# Patient Record
Sex: Female | Born: 1990 | Race: White | Hispanic: No | Marital: Married | Smoking: Never smoker
Health system: Southern US, Community
[De-identification: ages and names within clinical notes are randomized; demographics above are authoritative.]

## PROBLEM LIST (undated history)

## (undated) ENCOUNTER — Inpatient Hospital Stay (HOSPITAL_COMMUNITY): Payer: Self-pay

## (undated) DIAGNOSIS — R112 Nausea with vomiting, unspecified: Secondary | ICD-10-CM

## (undated) DIAGNOSIS — O169 Unspecified maternal hypertension, unspecified trimester: Secondary | ICD-10-CM

## (undated) DIAGNOSIS — N12 Tubulo-interstitial nephritis, not specified as acute or chronic: Secondary | ICD-10-CM

## (undated) DIAGNOSIS — Z9889 Other specified postprocedural states: Secondary | ICD-10-CM

## (undated) DIAGNOSIS — Z789 Other specified health status: Secondary | ICD-10-CM

## (undated) DIAGNOSIS — N390 Urinary tract infection, site not specified: Secondary | ICD-10-CM

## (undated) HISTORY — PX: OTHER SURGICAL HISTORY: SHX169

## (undated) HISTORY — PX: TONSILLECTOMY: SUR1361

## (undated) HISTORY — PX: GALLBLADDER SURGERY: SHX652

---
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
## 2011-04-20 LAB — CBC
MCV: 91.1
Platelets: 242
RDW: 11.5
WBC: 7.6

## 2011-04-20 LAB — HCG, QUANTITATIVE, PREGNANCY: hCG, Beta Chain, Quant, S: <2

## 2011-04-20 LAB — BASIC METABOLIC PANEL
BUN: 7
Chloride: 106
Creatinine, Ser: 0.74
Glucose, Bld: 110 — ABNORMAL HIGH

## 2011-04-20 LAB — HEPATIC FUNCTION PANEL
AST: 27
Bilirubin, Direct: 0.1
Total Bilirubin: 0.5

## 2011-04-23 LAB — BASIC METABOLIC PANEL
Calcium: 9.5
Glucose, Bld: 98
Potassium: 3.7
Sodium: 138

## 2011-04-23 LAB — URINALYSIS, ROUTINE W REFLEX MICROSCOPIC
Glucose, UA: NEGATIVE
Hgb urine dipstick: NEGATIVE
Protein, ur: NEGATIVE
Specific Gravity, Urine: 1.025
pH: 5.5

## 2011-04-23 LAB — CBC
HCT: 36.7
Hemoglobin: 12.9
RBC: 3.96
RDW: 11.7
WBC: 8.1

## 2011-07-13 NOTE — L&D Delivery Note (Signed)
Delivery Note At 4:46 PM a viable female was delivered via Vaginal, Spontaneous Delivery (Presentation: Left Occiput Anterior).  Loose nuchal cord x 2 reduced prior to delivery of body.  APGAR: 6, 9; weight 7 lb 12.7 oz (3535 g).   Placenta status: Intact, Spontaneous via Tomasa Blase.  Cord: 3 vessels with the following complications: None.    Anesthesia: Epidural  Episiotomy: None Lacerations: None Suture Repair: none Est. Blood Loss (mL): 250  Mom to postpartum.  Baby stabilized in warmer then skin-to skin with mom.  Raelyn Mora, SNM 06/06/2012, 5:13 PM Supervised by: Caren Griffins, CNM  Evaluation and management procedures were performed by SNM under my supervision/collaboration. Chart reviewed, patient examined by me and I agree with management and plan. Present for most of active 2nd stage and 3rd stage. Danae Orleans, CNM 06/06/2012 6:23 PM

## 2011-10-12 LAB — OB RESULTS CONSOLE RUBELLA ANTIBODY, IGM: Rubella: IMMUNE

## 2011-10-12 LAB — OB RESULTS CONSOLE ANTIBODY SCREEN: Antibody Screen: NEGATIVE

## 2011-10-14 ENCOUNTER — Inpatient Hospital Stay (HOSPITAL_COMMUNITY)
Admission: AD | Admit: 2011-10-14 | Discharge: 2011-10-14 | Disposition: A | Discharge status: Home / Self Care | Payer: PRIVATE HEALTH INSURANCE | Attending: Obstetrics & Gynecology | Admitting: Obstetrics & Gynecology

## 2011-10-14 ENCOUNTER — Encounter (HOSPITAL_COMMUNITY): Payer: Self-pay | Admitting: *Deleted

## 2011-10-14 ENCOUNTER — Inpatient Hospital Stay (HOSPITAL_COMMUNITY): Payer: PRIVATE HEALTH INSURANCE

## 2011-10-14 DIAGNOSIS — N760 Acute vaginitis: Secondary | ICD-10-CM | POA: Insufficient documentation

## 2011-10-14 DIAGNOSIS — B9689 Other specified bacterial agents as the cause of diseases classified elsewhere: Secondary | ICD-10-CM | POA: Insufficient documentation

## 2011-10-14 DIAGNOSIS — O26859 Spotting complicating pregnancy, unspecified trimester: Secondary | ICD-10-CM

## 2011-10-14 DIAGNOSIS — A499 Bacterial infection, unspecified: Secondary | ICD-10-CM | POA: Insufficient documentation

## 2011-10-14 DIAGNOSIS — O2390 Unspecified genitourinary tract infection in pregnancy, unspecified trimester: Secondary | ICD-10-CM | POA: Insufficient documentation

## 2011-10-14 HISTORY — DX: Other specified health status: Z78.9

## 2011-10-14 LAB — URINALYSIS, ROUTINE W REFLEX MICROSCOPIC
Glucose, UA: NEGATIVE mg/dL
Ketones, ur: NEGATIVE mg/dL
Leukocytes, UA: NEGATIVE
Nitrite: NEGATIVE
Specific Gravity, Urine: 1.025 (ref 1.005–1.030)
pH: 5.5 (ref 5.0–8.0)

## 2011-10-14 LAB — WET PREP, GENITAL: Trich, Wet Prep: NONE SEEN

## 2011-10-14 LAB — URINE MICROSCOPIC-ADD ON

## 2011-10-14 LAB — POCT PREGNANCY, URINE: Preg Test, Ur: POSITIVE — AB

## 2011-10-14 MED ORDER — METRONIDAZOLE 500 MG PO TABS: 500.0000 mg | ORAL_TABLET | Freq: Two times a day (BID) | ORAL | Status: AC

## 2011-10-14 NOTE — MAU Provider Note (Signed)
History     CSN: [id number]  Arrival date and time: 10/14/11 5366   First Provider Initiated Contact with Patient 10/14/11 0501      Chief Complaint  Patient presents with  . Vaginal Bleeding   HPI 21 y.o. G1P0 at [redacted]w[redacted]d with spotting since 11 PM tonight, mild low abd for about 2 hours. U/S at [facility] on Monday showed IUP with + FHR per patient.    Past Medical History  Diagnosis Date  . No pertinent past medical history     Past Surgical History  Procedure Date  . Tonsillectomy     History reviewed. No pertinent family history.  History  Substance Use Topics  . Smoking status: Never Smoker   . Smokeless tobacco: Not on file  . Alcohol Use: No    Allergies: No Known Allergies  Prescriptions prior to admission  Medication Sig Dispense Refill  . flintstones complete (FLINTSTONES) 60 MG chewable tablet Chew 2 tablets by mouth every morning.      . ondansetron (ZOFRAN) 8 MG tablet Take 8 mg by mouth every 8 (eight) hours as needed. For nausea or vomiting        Review of Systems  Constitutional: Negative.   Respiratory: Negative.   Cardiovascular: Negative.   Gastrointestinal: Positive for abdominal pain. Negative for nausea, vomiting, diarrhea and constipation.  Genitourinary: Negative for dysuria, urgency, frequency, hematuria and flank pain.       Positive for vaginal bleeding  Musculoskeletal: Negative.   Neurological: Negative.   Psychiatric/Behavioral: Negative.    Physical Exam   Last menstrual period 08/23/2011.  Physical Exam  Constitutional: She is oriented to person, place, and time. She appears well-developed and well-nourished. No distress.  HENT:  Head: Normocephalic and atraumatic.  Cardiovascular: Normal rate, regular rhythm and normal heart sounds.   Respiratory: Effort normal and breath sounds normal. No respiratory distress.  GI: Soft. Bowel sounds are normal. She exhibits no distension and no mass. There is no tenderness. There is no  rebound and no guarding.  Genitourinary: There is no rash or lesion on the right labia. There is no rash or lesion on the left labia. Uterus is not deviated, not enlarged, not fixed and not tender. Cervix exhibits friability. Cervix exhibits no motion tenderness and no discharge. Right adnexum displays no mass, no tenderness and no fullness. Left adnexum displays no mass, no tenderness and no fullness. No erythema, tenderness or bleeding around the vagina. No vaginal discharge found.  Neurological: She is alert and oriented to person, place, and time.  Skin: Skin is warm and dry.  Psychiatric: She has a normal mood and affect.    MAU Course  Procedures  Results for orders placed during the hospital encounter of 10/14/11 (from the past 24 hour(s))  URINALYSIS, ROUTINE W REFLEX MICROSCOPIC     Status: Abnormal   Collection Time   10/14/11  4:20 AM      Component Value Range   Color, Urine YELLOW  YELLOW    APPearance CLEAR  CLEAR    Specific Gravity, Urine 1.025  1.005 - 1.030    pH 5.5  5.0 - 8.0    Glucose, UA NEGATIVE  NEGATIVE (mg/dL)   Hgb urine dipstick TRACE (*) NEGATIVE    Bilirubin Urine NEGATIVE  NEGATIVE    Ketones, ur NEGATIVE  NEGATIVE (mg/dL)   Protein, ur NEGATIVE  NEGATIVE (mg/dL)   Urobilinogen, UA 0.2  0.0 - 1.0 (mg/dL)   Nitrite NEGATIVE  NEGATIVE  Leukocytes, UA NEGATIVE  NEGATIVE   URINE MICROSCOPIC-ADD ON     Status: Abnormal   Collection Time   10/14/11  4:20 AM      Component Value Range   Squamous Epithelial / LPF FEW (*) RARE    WBC, UA 0-2  <3 (WBC/hpf)   RBC / HPF 0-2  <3 (RBC/hpf)   Bacteria, UA FEW (*) RARE   POCT PREGNANCY, URINE     Status: Abnormal   Collection Time   10/14/11  4:35 AM      Component Value Range   Preg Test, Ur POSITIVE (*) NEGATIVE   WET PREP, GENITAL     Status: Abnormal   Collection Time   10/14/11  5:18 AM      Component Value Range   Yeast Wet Prep HPF POC NONE SEEN  NONE SEEN    Trich, Wet Prep NONE SEEN  NONE SEEN    Clue  Cells Wet Prep HPF POC FEW (*) NONE SEEN    WBC, Wet Prep HPF POC FEW (*) NONE SEEN    US Ob Comp Less 14 Wks  10/14/2011  *RADIOLOGY REPORT*  Clinical Data: Spotting.  Positive urine pregnancy test. Quantitative beta HCG not available.  Estimated gestational age by LMP is 7 weeks 3 days.  OBSTETRIC <14 WK ULTRASOUND  Technique:  Transabdominal ultrasound was performed for evaluation of the gestation as well as the maternal uterus and adnexal regions.  Comparison:  None.  Intrauterine gestational sac: There is a single intrauterine gestational sac. Yolk sac: Visualized Embryo: Visualized Cardiac Activity: Demonstrated Heart Rate: 150 bpm  CRL:  9.1 mm  sevenw  zerod         [place] EDC: 06/01/2012  Maternal uterus/Adnexae: No subchorionic hemorrhage.  Ovaries appear symmetrical with normal follicular changes.  No abnormal adnexal masses.  No free pelvic fluid collections.  IMPRESSION: Single intrauterine pregnancy.  Estimated gestational age by crown- rump length is 7 weeks 0 days.  Original Report Authenticated By: Marlon Pel, M.D.    Assessment and Plan  21 y.o. G1P0 at [redacted]w[redacted]d BV - rx Flagyl Rev'd precautions F/U as scheduled  [name] 10/14/2011, 5:55 AM

## 2011-10-14 NOTE — MAU Note (Signed)
Pt presents with complaint of bleeding since 2300, describes it as "spotting". At 0300 pt reports she began having lower abd pain. LMP 08/23/2011

## 2012-01-29 ENCOUNTER — Inpatient Hospital Stay (HOSPITAL_COMMUNITY)
Admission: AD | Admit: 2012-01-29 | Discharge: 2012-01-29 | Disposition: A | Discharge status: Home / Self Care | Payer: PRIVATE HEALTH INSURANCE | Source: Ambulatory Visit | Attending: Obstetrics & Gynecology | Admitting: Obstetrics & Gynecology

## 2012-01-29 DIAGNOSIS — R51 Headache: Secondary | ICD-10-CM | POA: Insufficient documentation

## 2012-01-29 DIAGNOSIS — J40 Bronchitis, not specified as acute or chronic: Secondary | ICD-10-CM | POA: Insufficient documentation

## 2012-01-29 DIAGNOSIS — R05 Cough: Secondary | ICD-10-CM | POA: Insufficient documentation

## 2012-01-29 DIAGNOSIS — R059 Cough, unspecified: Secondary | ICD-10-CM | POA: Insufficient documentation

## 2012-01-29 DIAGNOSIS — J069 Acute upper respiratory infection, unspecified: Secondary | ICD-10-CM | POA: Insufficient documentation

## 2012-01-29 DIAGNOSIS — O99891 Other specified diseases and conditions complicating pregnancy: Secondary | ICD-10-CM | POA: Insufficient documentation

## 2012-01-29 MED ORDER — HYDROCOD POLST-CHLORPHEN POLST 10-8 MG/5ML PO LQCR: 5.0000 mL | Freq: Two times a day (BID) | ORAL | Status: DC | PRN

## 2012-01-29 MED ORDER — PSEUDOEPHEDRINE HCL 60 MG PO TABS: 60.0000 mg | ORAL_TABLET | Freq: Four times a day (QID) | ORAL | Status: AC | PRN

## 2012-01-29 MED ORDER — HYDROCOD POLST-CHLORPHEN POLST 10-8 MG/5ML PO LQCR
5.0000 mL | Freq: Once | ORAL | Status: AC
  Administered 2012-01-29: 5 mL via ORAL
  Filled 2012-01-29: qty 5

## 2012-01-29 MED ORDER — PSEUDOEPHEDRINE HCL 30 MG PO TABS
60.0000 mg | ORAL_TABLET | Freq: Once | ORAL | Status: AC
  Administered 2012-01-29: 60 mg via ORAL
  Filled 2012-01-29: qty 2

## 2012-01-29 NOTE — MAU Provider Note (Signed)
History   CSN: [id number]  Arrival date and time: 01/29/12 2006   First Provider Initiated Contact with Patient 01/29/12 2120     Chief Complaint  Patient presents with  . Cough   HPI: [name] is a G1P0 patient at 21 weeks and 6 days gestation who presents to the MAU tonight for a cough, headache, and malaise that have been ongoing for 9-10 days now.  She started with a cough at first, and after coughing and having sinus congestion for a few days, she then developed a headache that is worse with coughing.  She also has body aches, stomach pain with coughing, and cannot sleep due to the discomfort.  She has not taken any antibiotics for this, but has been given Hycodan and Mucinex DM by her OB provider at [facility].  They have not been helping much.  She has taken Tussionex in the past and wonders if that would help better.  She denies shortness of breath, sputum production, fever, and inability to eat.  She has had some post-tussive emesis.  OB History    Grav Para Term Preterm Abortions TAB SAB Ect Mult Living   1               Past Medical History  Diagnosis Date  . No pertinent past medical history     Past Surgical History  Procedure Date  . Tonsillectomy     No family history on file.  History  Substance Use Topics  . Smoking status: Never Smoker   . Smokeless tobacco: Not on file  . Alcohol Use: No    Allergies: No Known Allergies  Prescriptions prior to admission  Medication Sig Dispense Refill  . acetaminophen (TYLENOL) 325 MG tablet Take 650 mg by mouth every 6 (six) hours as needed.      . flintstones complete (FLINTSTONES) 60 MG chewable tablet Chew 2 tablets by mouth every morning.      . ondansetron (ZOFRAN) 8 MG tablet Take 8 mg by mouth every 8 (eight) hours as needed. For nausea or vomiting      . DISCONTD: HYDROcodone-homatropine (HYCODAN) 5-1.5 MG/5ML syrup Take 5 mLs by mouth every 6 (six) hours as needed.      Marland Kitchen DISCONTD: Phenylephrine-DM-GG  (MUCINEX FAST-MAX CONGEST COUGH) 2.5-5-100 MG/5ML LIQD Take 20 mLs by mouth 4 (four) times daily as needed.        Review of Systems  Constitutional: Positive for malaise/fatigue. Negative for fever, chills and diaphoresis.  Eyes: Negative for blurred vision.  Respiratory: Positive for cough. Negative for hemoptysis, sputum production, shortness of breath and wheezing.   Cardiovascular: Positive for chest pain (with coughing). Negative for palpitations.  Gastrointestinal: Positive for vomiting (after coughing sometimes). Negative for heartburn and diarrhea.  Genitourinary: Negative for dysuria and frequency.  Musculoskeletal: Negative for back pain.  Skin: Negative for itching and rash.  Neurological: Positive for headaches. Negative for dizziness.  Psychiatric/Behavioral: Negative for depression.   Physical Exam   Blood pressure 125/70, pulse 102, temperature 98.3 F (36.8 C), temperature source Oral, resp. rate 20, height 5\' 5"  (1.651 m), weight 64.229 kg (141 lb 9.6 oz), last menstrual period 08/23/2011.  Physical Exam  Constitutional: She is oriented to person, place, and time. She appears well-developed and well-nourished. No distress.  HENT:  Right Ear: Tympanic membrane and ear canal normal.  Left Ear: Tympanic membrane and ear canal normal.  Nose: Mucosal edema and rhinorrhea present. No sinus tenderness. No epistaxis.  Mouth/Throat: Uvula is  midline and mucous membranes are normal. No oral lesions. Posterior oropharyngeal edema present. No oropharyngeal exudate or posterior oropharyngeal erythema.  Cardiovascular: Normal rate, regular rhythm and normal heart sounds.   Respiratory: Effort normal and breath sounds normal. No accessory muscle usage. Not tachypneic. No respiratory distress. She has no decreased breath sounds. She has no wheezes. She has no rhonchi. She has no rales.       Semi-productive cough during encounter  GI: Soft. Bowel sounds are normal.  Lymphadenopathy:      She has cervical adenopathy (Moderately large anterior LAD).  Neurological: She is alert and oriented to person, place, and time.  Skin: Skin is warm and dry. She is not diaphoretic.  Psychiatric: She has a normal mood and affect. Her behavior is normal.    MAU Course  Procedures  Pt given a dose of Tussionex and Sudafed prior to discharge because they do not have a way to get to a 24-hour pharmacy tonight.  Assessment and Plan  1. Likely bronchitis + viral URI: No evidence of pneumonia or respiratory distress based on history or physical exam. Given that almost all acute bronchitis in otherwise healthy patients is viral and she is afebrile with other normal vital signs, have elected not to treat with antibiotics at this point. Will treat symptomatically with Tussionex and Sudafed, as well with Tylenol and honey and other cough suppressants. We discussed that for fever/chills, chest pain, worsening or no improvement in symptoms, or SOB, she should seek treatment as she may need antibiotics at that point.  She plans to call [facility] next week for an appointment.  Junious Silk S 01/29/2012, 9:46 PM  I have seen and examined this patient and I agree with the above. Kilie Rund 1:05 AM 01/30/2012

## 2012-01-29 NOTE — MAU Note (Signed)
Started last Thursday with sore throat and cough. Saw Doctor Monday and heard wheezing in lungs. Given cough med but kept coughing. Went back Tues and given Mucinnex. Still coughing sometimes so bad I throw up. Body aching.

## 2012-01-30 NOTE — MAU Provider Note (Signed)
Attestation of Attending Supervision of Resident: Evaluation and management procedures were performed by the [facility] Medicine Resident under my supervision.  I have seen and examined the patient, reviewed the resident's note and chart, and I agree with the management and plan.  Anibal Henderson, M.D. 01/30/2012 2:49 AM

## 2012-02-17 ENCOUNTER — Encounter (HOSPITAL_COMMUNITY): Payer: Self-pay | Admitting: *Deleted

## 2012-02-17 ENCOUNTER — Inpatient Hospital Stay (HOSPITAL_COMMUNITY): Payer: PRIVATE HEALTH INSURANCE

## 2012-02-17 ENCOUNTER — Inpatient Hospital Stay (HOSPITAL_COMMUNITY)
Admission: AD | Admit: 2012-02-17 | Discharge: 2012-02-21 | DRG: 781 | Disposition: A | Discharge status: Home / Self Care | Payer: PRIVATE HEALTH INSURANCE | Source: Ambulatory Visit | Attending: Obstetrics and Gynecology | Admitting: Obstetrics and Gynecology

## 2012-02-17 DIAGNOSIS — O2390 Unspecified genitourinary tract infection in pregnancy, unspecified trimester: Principal | ICD-10-CM | POA: Diagnosis present

## 2012-02-17 DIAGNOSIS — N12 Tubulo-interstitial nephritis, not specified as acute or chronic: Secondary | ICD-10-CM | POA: Diagnosis present

## 2012-02-17 DIAGNOSIS — M549 Dorsalgia, unspecified: Secondary | ICD-10-CM | POA: Diagnosis present

## 2012-02-17 DIAGNOSIS — O2300 Infections of kidney in pregnancy, unspecified trimester: Secondary | ICD-10-CM

## 2012-02-17 HISTORY — DX: Urinary tract infection, site not specified: N39.0

## 2012-02-17 LAB — CBC WITH DIFFERENTIAL/PLATELET
Basophils Absolute: 0 10*3/uL (ref 0.0–0.1)
Eosinophils Relative: 0 % (ref 0–5)
HCT: 31.2 % — ABNORMAL LOW (ref 36.0–46.0)
Lymphocytes Relative: 11 % — ABNORMAL LOW (ref 12–46)
Lymphs Abs: 1 10*3/uL (ref 0.7–4.0)
MCV: 94.3 fL (ref 78.0–100.0)
Monocytes Absolute: 0.6 10*3/uL (ref 0.1–1.0)
RDW: 13.3 % (ref 11.5–15.5)
WBC: 8.9 10*3/uL (ref 4.0–10.5)

## 2012-02-17 LAB — URINALYSIS, ROUTINE W REFLEX MICROSCOPIC
Glucose, UA: NEGATIVE mg/dL
Protein, ur: NEGATIVE mg/dL

## 2012-02-17 LAB — URINE MICROSCOPIC-ADD ON

## 2012-02-17 MED ORDER — DOCUSATE SODIUM 100 MG PO CAPS
100.0000 mg | ORAL_CAPSULE | Freq: Every day | ORAL | Status: DC
  Administered 2012-02-17 – 2012-02-21 (×5): 100 mg via ORAL
  Filled 2012-02-17 (×5): qty 1

## 2012-02-17 MED ORDER — DEXTROSE 5 % IV SOLN: 2.0000 g | Freq: Two times a day (BID) | INTRAVENOUS | Status: DC

## 2012-02-17 MED ORDER — CALCIUM CARBONATE ANTACID 500 MG PO CHEW: 2.0000 | CHEWABLE_TABLET | ORAL | Status: DC | PRN

## 2012-02-17 MED ORDER — PRENATAL MULTIVITAMIN CH
1.0000 | ORAL_TABLET | Freq: Every day | ORAL | Status: DC
  Administered 2012-02-17 – 2012-02-21 (×2): 1 via ORAL
  Filled 2012-02-17 (×4): qty 1

## 2012-02-17 MED ORDER — ZOLPIDEM TARTRATE 5 MG PO TABS: 5.0000 mg | ORAL_TABLET | Freq: Every evening | ORAL | Status: DC | PRN

## 2012-02-17 MED ORDER — ACETAMINOPHEN 325 MG PO TABS
650.0000 mg | ORAL_TABLET | ORAL | Status: DC | PRN
  Administered 2012-02-20: 650 mg via ORAL
  Filled 2012-02-17: qty 2

## 2012-02-17 MED ORDER — ONDANSETRON 8 MG PO TBDP
8.0000 mg | ORAL_TABLET | Freq: Three times a day (TID) | ORAL | Status: DC | PRN
  Administered 2012-02-17 – 2012-02-18 (×2): 8 mg via ORAL
  Filled 2012-02-17 (×2): qty 1

## 2012-02-17 MED ORDER — OXYCODONE-ACETAMINOPHEN 5-325 MG PO TABS
1.0000 | ORAL_TABLET | Freq: Four times a day (QID) | ORAL | Status: DC | PRN
  Administered 2012-02-17 – 2012-02-18 (×4): 2 via ORAL
  Administered 2012-02-18 (×2): 1 via ORAL
  Administered 2012-02-19: 2 via ORAL
  Administered 2012-02-19: 1 via ORAL
  Administered 2012-02-19: 2 via ORAL
  Administered 2012-02-20: 1 via ORAL
  Filled 2012-02-17 (×4): qty 2
  Filled 2012-02-17: qty 1
  Filled 2012-02-17 (×2): qty 2
  Filled 2012-02-17: qty 1
  Filled 2012-02-17 (×2): qty 2
  Filled 2012-02-17: qty 1

## 2012-02-17 MED ORDER — ONDANSETRON HCL 4 MG/2ML IJ SOLN
4.0000 mg | Freq: Four times a day (QID) | INTRAMUSCULAR | Status: DC | PRN
  Administered 2012-02-17 – 2012-02-20 (×5): 4 mg via INTRAVENOUS
  Filled 2012-02-17 (×5): qty 2

## 2012-02-17 MED ORDER — DEXTROSE 5 % IV SOLN
1.0000 g | Freq: Two times a day (BID) | INTRAVENOUS | Status: DC
  Administered 2012-02-17 – 2012-02-21 (×10): 1 g via INTRAVENOUS
  Filled 2012-02-17 (×10): qty 10

## 2012-02-17 MED ORDER — SODIUM CHLORIDE 0.9 % IV SOLN
INTRAVENOUS | Status: DC
  Administered 2012-02-17 – 2012-02-21 (×12): via INTRAVENOUS

## 2012-02-17 MED ORDER — LACTATED RINGERS IV SOLN
INTRAVENOUS | Status: DC
  Administered 2012-02-17: 02:00:00 via INTRAVENOUS

## 2012-02-17 NOTE — Progress Notes (Signed)
FACULTY PRACTICE ANTEPARTUM(COMPREHENSIVE) NOTE  [name] is a 21 y.o. G1P0 at [redacted]w[redacted]d by LMP,  who is admitted for PYelonephritis.   Fetal presentation is unsure. Length of Stay:  0  Days  Subjective: Pain releived completely s/p analgesic oral percocet Patient reports the fetal movement as active. Patient reports uterine contraction  activity as none. Patient reports  vaginal bleeding as none. Patient describes fluid per vagina as None.  Vitals:  Blood pressure 108/55, pulse 86, temperature 98.8 F (37.1 C), temperature source Oral, resp. rate 20, height 5\' 5"  (1.651 m), weight 64.864 kg (143 lb), last menstrual period 08/23/2011, SpO2 100.00%. Physical Examination:  General appearance - alert, well appearing, and in no distress Heart - normal rate and regular rhythm Abdomen - soft, nontender, nondistended Fundal Height:  size equals dates Cervical Exam: Not evaluated. Extremities: no sign of DVT with DTRs 2+ bilaterally Membranes:intact  Fetal Monitoring:  Baseline: 140 bpm  Labs:  Recent Results (from the past 24 hour(s))  URINALYSIS, ROUTINE W REFLEX MICROSCOPIC   Collection Time   02/17/12 12:55 AM      Component Value Range   Color, Urine YELLOW  YELLOW   APPearance CLEAR  CLEAR   Specific Gravity, Urine 1.015  1.005 - 1.030   pH 8.0  5.0 - 8.0   Glucose, UA NEGATIVE  NEGATIVE mg/dL   Hgb urine dipstick LARGE (*) NEGATIVE   Bilirubin Urine NEGATIVE  NEGATIVE   Ketones, ur NEGATIVE  NEGATIVE mg/dL   Protein, ur NEGATIVE  NEGATIVE mg/dL   Urobilinogen, UA 0.2  0.0 - 1.0 mg/dL   Nitrite NEGATIVE  NEGATIVE   Leukocytes, UA MODERATE (*) NEGATIVE  URINE MICROSCOPIC-ADD ON   Collection Time   02/17/12 12:55 AM      Component Value Range   Squamous Epithelial / LPF FEW (*) RARE   WBC, UA 11-20  <3 WBC/hpf   RBC / HPF 21-50  <3 RBC/hpf   Bacteria, UA MANY (*) RARE  CBC WITH DIFFERENTIAL   Collection Time   02/17/12  1:00 AM      Component Value Range   WBC 8.9   4.0 - 10.5 K/uL   RBC 3.31 (*) 3.87 - 5.11 MIL/uL   Hemoglobin 10.7 (*) 12.0 - 15.0 g/dL   HCT 98.1 (*) 19.1 - 47.8 %   MCV 94.3  78.0 - 100.0 fL   MCH 32.3  26.0 - 34.0 pg   MCHC 34.3  30.0 - 36.0 g/dL   RDW 29.5  62.1 - 30.8 %   Platelets 159  150 - 400 K/uL   Neutrophils Relative 83 (*) 43 - 77 %   Neutro Abs 7.4  1.7 - 7.7 K/uL   Lymphocytes Relative 11 (*) 12 - 46 %   Lymphs Abs 1.0  0.7 - 4.0 K/uL   Monocytes Relative 6  3 - 12 %   Monocytes Absolute 0.6  0.1 - 1.0 K/uL   Eosinophils Relative 0  0 - 5 %   Eosinophils Absolute 0.0  0.0 - 0.7 K/uL   Basophils Relative 0  0 - 1 %   Basophils Absolute 0.0  0.0 - 0.1 K/uL        Medications:  Scheduled    . cefTRIAXone (ROCEPHIN)  IV  1 g Intravenous Q12H  . docusate sodium  100 mg Oral Daily  . prenatal multivitamin  1 tablet Oral Daily  . DISCONTD: cefTRIAXone (ROCEPHIN)  IV  2 g Intravenous Q12H  I have reviewed the patient's current medications.  ASSESSMENT:  Preg 24+4 wk, early pyelonephritis  PLAN: Expect brief hospitalization followed by outpt oral tx once afebrile x 24  Skyeler Scalese V 02/17/2012,6:29 AM

## 2012-02-17 NOTE — Progress Notes (Signed)
UR Chart review completed.  

## 2012-02-17 NOTE — H&P (Signed)
[name] is a 21 y.o. G1P0 at [redacted]w[redacted]d admitted for pyelonephritis.   0 Fetal presentation is unsure.  History of Present Illness: C/O Right sided back pain for a few days.  Denies dysuria Patient reports the fetal movement as active. Patient reports uterine contraction  activity as none. Patient reports  vaginal bleeding as none. Patient describes fluid per vagina as None.  There is no problem list on file for this patient.  Past Medical History: Past Medical History  Diagnosis Date  . No pertinent past medical history   . Urinary tract infection     Past Surgical History: Past Surgical History  Procedure Date  . Tonsillectomy   . Gallbladder surgery   . Bladder stem stretcher     Obstetrical History: OB History    Grav Para Term Preterm Abortions TAB SAB Ect Mult Living   1               Gynecological History: negative  Social History: History   Social History  . Marital Status: Single    Spouse Name: N/A    Number of Children: N/A  . Years of Education: N/A   Social History Main Topics  . Smoking status: Never Smoker   . Smokeless tobacco: None  . Alcohol Use: No  . Drug Use: No  . Sexually Active: Yes   Other Topics Concern  . None   Social History Narrative  . None    Family History: History reviewed. No pertinent family history.  Allergies: No Known Allergies  Prescriptions prior to admission  Medication Sig Dispense Refill  . acetaminophen (TYLENOL) 325 MG tablet Take 650 mg by mouth every 6 (six) hours as needed.      . chlorpheniramine-HYDROcodone (TUSSIONEX) 10-8 MG/5ML LQCR Take 5 mLs by mouth every 12 (twelve) hours as needed (cough).  140 mL  0  . flintstones complete (FLINTSTONES) 60 MG chewable tablet Chew 2 tablets by mouth every morning.      . ondansetron (ZOFRAN) 8 MG tablet Take 8 mg by mouth every 8 (eight) hours as needed. For nausea or vomiting        Review of Systems - Negative except back pain  Vitals:   Blood pressure 119/78, pulse 116, temperature 101.4 F (38.6 C), temperature source Oral, resp. rate 18, height 5\' 5"  (1.651 m), weight 64.864 kg (143 lb), last menstrual period 08/23/2011, SpO2 100.00%. Physical Examination:  General appearance - alert, well appearing, and in no distress Chest - clear to auscultation, no wheezes, rales or rhonchi, symmetric air entry Heart - normal rate and regular rhythm Abdomen - soft, nontender, nondistended, no masses or organomegaly Extremities - peripheral pulses normal, no pedal edema, no clubbing or cyanosis Skin - warm to touch Abdomen: gravid and non-tender and fundal height  is size equals dates Pelvic Exam:examination not indicated Cervix: Not evaluated. and found to be not evaluated/ . Membranes:intact Fetal Monitoring:Baseline: 150 bpm, Variability: Good {> 6 bpm), Accelerations: Non-reactive but appropriate for gestational age and Decelerations: Absent   Labs:  Recent Results (from the past 24 hour(s))  URINALYSIS, ROUTINE W REFLEX MICROSCOPIC   Collection Time   02/17/12 12:55 AM      Component Value Range   Color, Urine YELLOW  YELLOW   APPearance CLEAR  CLEAR   Specific Gravity, Urine 1.015  1.005 - 1.030   pH 8.0  5.0 - 8.0   Glucose, UA NEGATIVE  NEGATIVE mg/dL   Hgb urine dipstick LARGE (*)  NEGATIVE   Bilirubin Urine NEGATIVE  NEGATIVE   Ketones, ur NEGATIVE  NEGATIVE mg/dL   Protein, ur NEGATIVE  NEGATIVE mg/dL   Urobilinogen, UA 0.2  0.0 - 1.0 mg/dL   Nitrite NEGATIVE  NEGATIVE   Leukocytes, UA MODERATE (*) NEGATIVE  URINE MICROSCOPIC-ADD ON   Collection Time   02/17/12 12:55 AM      Component Value Range   Squamous Epithelial / LPF FEW (*) RARE   WBC, UA 11-20  <3 WBC/hpf   RBC / HPF 21-50  <3 RBC/hpf   Bacteria, UA MANY (*) RARE  CBC WITH DIFFERENTIAL   Collection Time   02/17/12  1:00 AM      Component Value Range   WBC 8.9  4.0 - 10.5 K/uL   RBC 3.31 (*) 3.87 - 5.11 MIL/uL   Hemoglobin 10.7 (*) 12.0 - 15.0 g/dL    HCT 45.4 (*) 09.8 - 46.0 %   MCV 94.3  78.0 - 100.0 fL   MCH 32.3  26.0 - 34.0 pg   MCHC 34.3  30.0 - 36.0 g/dL   RDW 11.9  14.7 - 82.9 %   Platelets 159  150 - 400 K/uL   Neutrophils Relative 83 (*) 43 - 77 %   Neutro Abs 7.4  1.7 - 7.7 K/uL   Lymphocytes Relative 11 (*) 12 - 46 %   Lymphs Abs 1.0  0.7 - 4.0 K/uL   Monocytes Relative 6  3 - 12 %   Monocytes Absolute 0.6  0.1 - 1.0 K/uL   Eosinophils Relative 0  0 - 5 %   Eosinophils Absolute 0.0  0.0 - 0.7 K/uL   Basophils Relative 0  0 - 1 %   Basophils Absolute 0.0  0.0 - 0.1 K/uL    Imaging Studies: No results found.     I have reviewed the patient's current medications.   ASSESSMENT: Pyelonephritis  POC discussed with Dr. Emelda Fear

## 2012-02-17 NOTE — MAU Note (Signed)
Pt states she started feeling pain about 02/16/2012 pt also had a temp of 100.3 and took tylenol. Pt states temp went down. Pain and temp came back about 2100.

## 2012-02-17 NOTE — MAU Note (Signed)
Pt reports mid back pain pain today, pressure after urination. Fever at home of 100.3, took tylenol at 1700

## 2012-02-17 NOTE — H&P (Signed)
Attestation of Attending Supervision of Advanced Practitioner: Evaluation and management procedures were performed by the PA/NP/CNM/OB Fellow under my supervision/collaboration. Chart reviewed and agree with management and plan.  Jaleea Alesi V 02/17/2012 5:46 AM

## 2012-02-18 DIAGNOSIS — O2300 Infections of kidney in pregnancy, unspecified trimester: Secondary | ICD-10-CM | POA: Diagnosis present

## 2012-02-18 NOTE — Progress Notes (Signed)
Pt with c/o chest heaviness and tightness. O2 sats and heart rate normal. Pt reports she is recovering from bronchitis. Pt reassured. No productive cough at this time. Will report to primary RN. Pt verbalized understanding to call out if symptoms worsen. Pt eating lunch, mom at the bedside.

## 2012-02-18 NOTE — Progress Notes (Signed)
Lungs clear pulse ix 100%

## 2012-02-18 NOTE — Progress Notes (Signed)
Patient ID: [name], female   DOB: 10-04-1990, 21 y.o.   MRN: [id number] FACULTY PRACTICE ANTEPARTUM(COMPREHENSIVE) NOTE  [name] is a 21 y.o. G1P0000 at [redacted]w[redacted]d by best clinical estimate who is admitted for pyelonephritis.   Fetal presentation is unsure. Length of Stay:  1  Days  Subjective: Right flank pain, moving to left Patient reports the fetal movement as active. Patient reports uterine contraction  activity as none. Patient reports  vaginal bleeding as none. Patient describes fluid per vagina as None.  Vitals:  Blood pressure 116/63, pulse 103, temperature 98.8 F (37.1 C), temperature source Oral, resp. rate 20, height 5\' 5"  (1.651 m), weight 64.864 kg (143 lb), last menstrual period 08/23/2011, SpO2 100.00%. Physical Examination:  General appearance - alert, well appearing, and in no distress Heart - normal rate and regular rhythm Abdomen - soft, nontender,  mild right CVAT Fundal Height:  size equals dates Cervical Exam: Not evaluated. and found to be not evaluated/ / and fetal presentation is unsure. Extremities: extremities normal, atraumatic, no cyanosis or edema and Homans sign is negative, no sign of DVT with DTRs 2+ bilaterally Membranes:intact  Fetal Monitoring:  Baseline: 150 bpm  Labs:  No results found for this or any previous visit (from the past 24 hour(s)).  Imaging Studies:     Currently EPIC will not allow sonographic studies to automatically populate into notes.  In the meantime, copy and paste results into note or free text.  Medications:  Scheduled    . cefTRIAXone (ROCEPHIN)  IV  1 g Intravenous Q12H  . docusate sodium  100 mg Oral Daily  . prenatal multivitamin  1 tablet Oral Daily   I have reviewed the patient's current medications.  ASSESSMENT: Patient Active Problem List  Diagnosis  . Pyelonephritis during pregnancy, antepartum    PLAN: Continue IV antibiotics  Neven Fina 02/18/2012,7:36 AM

## 2012-02-19 LAB — URINE CULTURE: Colony Count: >=100000

## 2012-02-19 NOTE — Progress Notes (Signed)
Patient ID: [name], female   DOB: 10-Mar-1991, 21 y.o.   MRN: [id number] Patient ID: [name], female   DOB: 07/05/1991, 21 y.o.   MRN: [id number] FACULTY PRACTICE ANTEPARTUM(COMPREHENSIVE) NOTE  [name] is a 21 y.o. G1P0000 at [redacted]w[redacted]d by best clinical estimate who is admitted for pyelonephritis.  [name] states her pain is still present on the right but is improved. Fetal presentation is unsure. Length of Stay:  2  Days  Subjective: [name] states her pain is still present on the right but is improved Patient reports the fetal movement as active. Patient reports uterine contraction  activity as none. Patient reports  vaginal bleeding as none. Patient describes fluid per vagina as None.  Vitals:  Blood pressure 105/55, pulse 72, temperature 98.3 F (36.8 C), temperature source Oral, resp. rate 18, height 5\' 5"  (1.651 m), weight 64.864 kg (143 lb), last menstrual period 08/23/2011, SpO2 99.00%. Physical Examination:  General appearance - alert, well appearing, and in no distress Heart - normal rate and regular rhythm Abdomen - soft, nontender,  mild right CVAT Fundal Height:  size equals dates Cervical Exam: Not evaluated. and found to be not evaluated/ / and fetal presentation is unsure. Extremities: extremities normal, atraumatic, no cyanosis or edema and Homans sign is negative, no sign of DVT with DTRs 2+ bilaterally Membranes:intact  Fetal Monitoring:  Baseline: 150 bpm  Labs:  No results found for this or any previous visit (from the past 24 hour(s)).  Imaging Studies:     Currently EPIC will not allow sonographic studies to automatically populate into notes.  In the meantime, copy and paste results into note or free text.  Medications:  Scheduled    . cefTRIAXone (ROCEPHIN)  IV  1 g Intravenous Q12H  . docusate sodium  100 mg Oral Daily  . prenatal multivitamin  1 tablet Oral Daily   I have reviewed the patient's current  medications.  ASSESSMENT: Patient Active Problem List  Diagnosis  . Pyelonephritis during pregnancy, antepartum    PLAN: Continue Rocephin 1 gram q 12 hours, awaiting sensitivities, culture positive for E.coli  EURE,LUTHER H 02/19/2012,7:41 AM

## 2012-02-20 MED ORDER — PANTOPRAZOLE SODIUM 40 MG PO TBEC
40.0000 mg | DELAYED_RELEASE_TABLET | Freq: Every day | ORAL | Status: DC
  Administered 2012-02-20: 40 mg via ORAL
  Filled 2012-02-20 (×2): qty 1

## 2012-02-20 MED ORDER — ONDANSETRON 8 MG/NS 50 ML IVPB
8.0000 mg | Freq: Four times a day (QID) | INTRAVENOUS | Status: DC | PRN
  Administered 2012-02-20: 8 mg via INTRAVENOUS
  Filled 2012-02-20: qty 8

## 2012-02-20 MED ORDER — ONDANSETRON HCL 4 MG/2ML IJ SOLN: 8.0000 mg | Freq: Four times a day (QID) | INTRAMUSCULAR | Status: DC | PRN

## 2012-02-20 MED ORDER — MAGNESIUM HYDROXIDE 400 MG/5ML PO SUSP
30.0000 mL | Freq: Once | ORAL | Status: AC
  Administered 2012-02-20: 30 mL via ORAL
  Filled 2012-02-20: qty 30

## 2012-02-20 NOTE — Progress Notes (Signed)
Patient ID: SHELIA KINGSBERRY, female   DOB: 30-Sep-1990, 21 y.o.   MRN: [id number] Patient ID: JOSLYNNE KLATT, female   DOB: [birth year], 21 y.o.   MRN: [id number] Patient ID: ANNALAURA SAUSEDA, female   DOB: 1990/09/14, 21 y.o.   MRN: [id number] FACULTY PRACTICE ANTEPARTUM(COMPREHENSIVE) NOTE  HALIEY ROMBERG is a 21 y.o. G1P0000 at [redacted]w[redacted]d by best clinical estimate who is admitted for pyelonephritis.  Nehemiah Settle states her pain is still present on the right but is improved. Fetal presentation is unsure. Length of Stay:  3  Days  Subjective: Nehemiah Settle states her pain is still present on the right but is improved Patient reports the fetal movement as active. Patient reports uterine contraction  activity as none. Patient reports  vaginal bleeding as none. Patient describes fluid per vagina as None.  Vitals: Filed Vitals:   02/19/12 1925 02/19/12 2329 02/20/12 0215 02/20/12 0829  BP: 111/60 104/54    Pulse: 80 75    Temp: 98.4 F (36.9 C) 99 F (37.2 C) 98 F (36.7 C)   TempSrc: Oral Oral Axillary   Resp: 18 18  18   Height:      Weight:      SpO2:           Blood pressure 104/54, pulse 75, temperature 98 F (36.7 C), temperature source Axillary, resp. rate 18, height 5\' 5"  (1.651 m), weight 64.864 kg (143 lb), last menstrual period 08/23/2011, SpO2 99.00%. Physical Examination:  General appearance - alert, well appearing, and in no distress Heart - normal rate and regular rhythm Abdomen - soft, nontender,  mild right CVAT Fundal Height:  size equals dates Cervical Exam: Not evaluated. and found to be not evaluated/ / and fetal presentation is unsure. Extremities: extremities normal, atraumatic, no cyanosis or edema and Homans sign is negative, no sign of DVT with DTRs 2+ bilaterally Membranes:intact  Fetal Monitoring:  Baseline: 150 bpm  Labs:  Recent Results (from the past 168 hour(s))  URINALYSIS, ROUTINE W REFLEX MICROSCOPIC   Collection Time   02/17/12 12:55 AM      Component  Value Range   Color, Urine YELLOW  YELLOW   APPearance CLEAR  CLEAR   Specific Gravity, Urine 1.015  1.005 - 1.030   pH 8.0  5.0 - 8.0   Glucose, UA NEGATIVE  NEGATIVE mg/dL   Hgb urine dipstick LARGE (*) NEGATIVE   Bilirubin Urine NEGATIVE  NEGATIVE   Ketones, ur NEGATIVE  NEGATIVE mg/dL   Protein, ur NEGATIVE  NEGATIVE mg/dL   Urobilinogen, UA 0.2  0.0 - 1.0 mg/dL   Nitrite NEGATIVE  NEGATIVE   Leukocytes, UA MODERATE (*) NEGATIVE  URINE MICROSCOPIC-ADD ON   Collection Time   02/17/12 12:55 AM      Component Value Range   Squamous Epithelial / LPF FEW (*) RARE   WBC, UA 11-20  <3 WBC/hpf   RBC / HPF 21-50  <3 RBC/hpf   Bacteria, UA MANY (*) RARE  URINE CULTURE   Collection Time   02/17/12 12:55 AM      Component Value Range   Specimen Description URINE, CLEAN CATCH     Special Requests NONE     Culture  Setup Time 02/17/2012 09:53     Colony Count >=100,000 COLONIES/ML     Culture ESCHERICHIA COLI     Report Status 02/19/2012 FINAL     Organism ID, Bacteria ESCHERICHIA COLI    CBC WITH DIFFERENTIAL   Collection Time   02/17/12  1:00 AM      Component Value Range   WBC 8.9  4.0 - 10.5 K/uL   RBC 3.31 (*) 3.87 - 5.11 MIL/uL   Hemoglobin 10.7 (*) 12.0 - 15.0 g/dL   HCT 09.6 (*) 04.5 - 40.9 %   MCV 94.3  78.0 - 100.0 fL   MCH 32.3  26.0 - 34.0 pg   MCHC 34.3  30.0 - 36.0 g/dL   RDW 81.1  91.4 - 78.2 %   Platelets 159  150 - 400 K/uL   Neutrophils Relative 83 (*) 43 - 77 %   Neutro Abs 7.4  1.7 - 7.7 K/uL   Lymphocytes Relative 11 (*) 12 - 46 %   Lymphs Abs 1.0  0.7 - 4.0 K/uL   Monocytes Relative 6  3 - 12 %   Monocytes Absolute 0.6  0.1 - 1.0 K/uL   Eosinophils Relative 0  0 - 5 %   Eosinophils Absolute 0.0  0.0 - 0.7 K/uL   Basophils Relative 0  0 - 1 %   Basophils Absolute 0.0  0.0 - 0.1 K/uL         Imaging Studies:     Medications:  Scheduled    . cefTRIAXone (ROCEPHIN)  IV  1 g Intravenous Q12H  . docusate sodium  100 mg Oral Daily  . prenatal  multivitamin  1 tablet Oral Daily   I have reviewed the patient's current medications.  ASSESSMENT: Patient Active Problem List  Diagnosis  . Pyelonephritis during pregnancy, antepartum    PLAN: Continue Rocephin 1 gram q 12 hours, awaiting sensitivities, culture positive for E.coli Anticipate discharge tomorrow  EURE,LUTHER H 02/20/2012,8:41 AM

## 2012-02-20 NOTE — Plan of Care (Signed)
Pt c/o sharp pain in lower abdomen bilaterally x 2; denies @ present; Toco belt applied to assess; 2 uc's in 10 min with irritability; pt does not feel uc's, denies any further pain during time of monitoring.  Dr. Despina Hidden notified of pt's status, persistent nausea, and uc pattern.  Orders received.

## 2012-02-20 NOTE — Plan of Care (Addendum)
Pt continues to c/o intermittent sharp pain, mainly when she sits up in bed or sits on side of bed.  Emotional support given, but pt remains anxious.  RN x 20 min @ bedside with no uc palpated, no c/o the abdominal pain during this time. Pt given marker button to push when she feels the pain.

## 2012-02-21 DIAGNOSIS — O2390 Unspecified genitourinary tract infection in pregnancy, unspecified trimester: Secondary | ICD-10-CM

## 2012-02-21 DIAGNOSIS — N390 Urinary tract infection, site not specified: Secondary | ICD-10-CM

## 2012-02-21 DIAGNOSIS — N12 Tubulo-interstitial nephritis, not specified as acute or chronic: Secondary | ICD-10-CM

## 2012-02-21 MED ORDER — ONDANSETRON 8 MG PO TBDP: 8.0000 mg | ORAL_TABLET | Freq: Three times a day (TID) | ORAL | Status: AC | PRN

## 2012-02-21 MED ORDER — CEPHALEXIN 500 MG PO CAPS: 500.0000 mg | ORAL_CAPSULE | Freq: Four times a day (QID) | ORAL | Status: AC

## 2012-02-21 NOTE — Discharge Summary (Signed)
Physician Discharge Summary  Patient ID: [name] MRN: [id number] DOB/AGE: [birth year] 20 y.o.  Admit date: 02/17/2012 Discharge date: 02/21/2012  Admission Diagnoses: [redacted] weeks pregnant with pyelonephritis, right sided Discharge Diagnoses:  Active Problems:  Pyelonephritis during pregnancy, antepartum   Discharged Condition: good  Hospital Course: unremarkable  Consults: None  Significant Diagnostic Studies: labs: E coli  Treatments: antibiotics: ceftriaxone  Discharge Exam: Blood pressure 109/48, pulse 61, temperature 98.4 F (36.9 C), temperature source Oral, resp. rate 18, height 5\' 5"  (1.651 m), weight 64.864 kg (143 lb), last menstrual period 08/23/2011, SpO2 99.00%. General appearance: alert, cooperative and no distress Back: negative, symmetric, no curvature. ROM normal. No CVA tenderness. GI: soft, non-tender; bowel sounds normal; no masses,  no organomegalypregnant  Disposition: 01-Home or Self Care  Discharge Orders    Future Orders Please Complete By Expires   Diet - low sodium heart healthy      Increase activity slowly      Call MD for:  temperature >100.4      Call MD for:  persistant nausea and vomiting      Call MD for:  severe uncontrolled pain        Medication List  As of 02/21/2012  7:49 AM   TAKE these medications         acetaminophen 325 MG tablet   Commonly known as: TYLENOL   Take 650 mg by mouth every 6 (six) hours as needed.      cephALEXin 500 MG capsule   Commonly known as: KEFLEX   Take 1 capsule (500 mg total) by mouth 4 (four) times daily.      flintstones complete 60 MG chewable tablet   Chew 2 tablets by mouth every morning.      ondansetron 8 MG disintegrating tablet   Commonly known as: ZOFRAN-ODT   Take 1 tablet (8 mg total) by mouth every 8 (eight) hours as needed.           Follow-up Information    Follow up with Lazaro Arms, MD in 1 week.   Contact information:   [facility] [address] [place] [address] [phone]          Signed: Lazaro Arms 02/21/2012, 7:49 AM

## 2012-03-09 ENCOUNTER — Inpatient Hospital Stay (HOSPITAL_COMMUNITY): Payer: PRIVATE HEALTH INSURANCE

## 2012-03-09 ENCOUNTER — Encounter (HOSPITAL_COMMUNITY): Payer: Self-pay | Admitting: *Deleted

## 2012-03-09 ENCOUNTER — Inpatient Hospital Stay (HOSPITAL_COMMUNITY)
Admission: AD | Admit: 2012-03-09 | Discharge: 2012-03-10 | DRG: 781 | Disposition: A | Discharge status: Home / Self Care | Payer: PRIVATE HEALTH INSURANCE | Source: Ambulatory Visit | Attending: Obstetrics and Gynecology | Admitting: Obstetrics and Gynecology

## 2012-03-09 DIAGNOSIS — O4690 Antepartum hemorrhage, unspecified, unspecified trimester: Principal | ICD-10-CM | POA: Diagnosis present

## 2012-03-09 DIAGNOSIS — N12 Tubulo-interstitial nephritis, not specified as acute or chronic: Secondary | ICD-10-CM

## 2012-03-09 DIAGNOSIS — O4693 Antepartum hemorrhage, unspecified, third trimester: Secondary | ICD-10-CM

## 2012-03-09 DIAGNOSIS — O4700 False labor before 37 completed weeks of gestation, unspecified trimester: Secondary | ICD-10-CM

## 2012-03-09 DIAGNOSIS — O2390 Unspecified genitourinary tract infection in pregnancy, unspecified trimester: Secondary | ICD-10-CM

## 2012-03-09 HISTORY — DX: Nausea with vomiting, unspecified: R11.2

## 2012-03-09 HISTORY — DX: Other specified postprocedural states: Z98.890

## 2012-03-09 LAB — URINE MICROSCOPIC-ADD ON

## 2012-03-09 LAB — URINALYSIS, ROUTINE W REFLEX MICROSCOPIC
Glucose, UA: NEGATIVE mg/dL
Ketones, ur: NEGATIVE mg/dL
Leukocytes, UA: NEGATIVE
Protein, ur: NEGATIVE mg/dL
pH: 6.5 (ref 5.0–8.0)

## 2012-03-09 LAB — RAPID URINE DRUG SCREEN, HOSP PERFORMED
Amphetamines: NOT DETECTED
Benzodiazepines: NOT DETECTED
Opiates: NOT DETECTED

## 2012-03-09 LAB — CBC
HCT: 32.9 % — ABNORMAL LOW (ref 36.0–46.0)
MCH: 32.5 pg (ref 26.0–34.0)
MCV: 93.7 fL (ref 78.0–100.0)
Platelets: 181 10*3/uL (ref 150–400)
RDW: 13.6 % (ref 11.5–15.5)

## 2012-03-09 MED ORDER — ONDANSETRON HCL 4 MG PO TABS
8.0000 mg | ORAL_TABLET | Freq: Four times a day (QID) | ORAL | Status: DC | PRN
  Administered 2012-03-09: 8 mg via ORAL
  Filled 2012-03-09: qty 2

## 2012-03-09 MED ORDER — ACETAMINOPHEN 325 MG PO TABS
650.0000 mg | ORAL_TABLET | ORAL | Status: DC | PRN
  Administered 2012-03-09: 650 mg via ORAL
  Filled 2012-03-09: qty 2

## 2012-03-09 MED ORDER — NITROFURANTOIN MONOHYD MACRO 100 MG PO CAPS
100.0000 mg | ORAL_CAPSULE | Freq: Two times a day (BID) | ORAL | Status: DC
  Administered 2012-03-09 – 2012-03-10 (×3): 100 mg via ORAL
  Filled 2012-03-09 (×5): qty 1

## 2012-03-09 MED ORDER — PRENATAL MULTIVITAMIN CH
1.0000 | ORAL_TABLET | Freq: Every day | ORAL | Status: DC
  Filled 2012-03-09 (×2): qty 1

## 2012-03-09 MED ORDER — MAGNESIUM SULFATE 40 G IN LACTATED RINGERS - SIMPLE
2.5000 g/h | INTRAVENOUS | Status: AC
  Administered 2012-03-10: 2.5 g/h via INTRAVENOUS
  Filled 2012-03-09 (×2): qty 500

## 2012-03-09 MED ORDER — BETAMETHASONE SOD PHOS & ACET 6 (3-3) MG/ML IJ SUSP
12.0000 mg | INTRAMUSCULAR | Status: AC
  Administered 2012-03-09 – 2012-03-10 (×2): 12 mg via INTRAMUSCULAR
  Filled 2012-03-09 (×2): qty 2

## 2012-03-09 MED ORDER — HYDROCODONE-ACETAMINOPHEN 5-325 MG PO TABS
1.0000 | ORAL_TABLET | ORAL | Status: AC
  Administered 2012-03-09: 1 via ORAL
  Filled 2012-03-09: qty 1

## 2012-03-09 MED ORDER — SODIUM CHLORIDE 0.9 % IV SOLN
INTRAVENOUS | Status: DC
  Administered 2012-03-09 – 2012-03-10 (×3): via INTRAVENOUS

## 2012-03-09 MED ORDER — MAGNESIUM SULFATE BOLUS VIA INFUSION
4.0000 g | Freq: Once | INTRAVENOUS | Status: AC
  Administered 2012-03-09: 4 g via INTRAVENOUS
  Filled 2012-03-09: qty 500

## 2012-03-09 MED ORDER — CALCIUM CARBONATE ANTACID 500 MG PO CHEW
2.0000 | CHEWABLE_TABLET | ORAL | Status: DC | PRN
  Filled 2012-03-09: qty 2

## 2012-03-09 MED ORDER — ONDANSETRON HCL 4 MG/2ML IJ SOLN
4.0000 mg | Freq: Four times a day (QID) | INTRAMUSCULAR | Status: DC | PRN
  Administered 2012-03-09 – 2012-03-10 (×3): 4 mg via INTRAVENOUS
  Filled 2012-03-09 (×3): qty 2

## 2012-03-09 MED ORDER — DOCUSATE SODIUM 100 MG PO CAPS
100.0000 mg | ORAL_CAPSULE | Freq: Every day | ORAL | Status: DC
  Administered 2012-03-09: 100 mg via ORAL
  Filled 2012-03-09 (×2): qty 1

## 2012-03-09 MED ORDER — ZOLPIDEM TARTRATE 5 MG PO TABS: 5.0000 mg | ORAL_TABLET | Freq: Every evening | ORAL | Status: DC | PRN

## 2012-03-09 MED ORDER — NIFEDIPINE 10 MG PO CAPS
10.0000 mg | ORAL_CAPSULE | ORAL | Status: AC
  Administered 2012-03-09: 10 mg via ORAL
  Filled 2012-03-09: qty 1

## 2012-03-09 MED ORDER — HYDROMORPHONE HCL PF 1 MG/ML IJ SOLN
2.0000 mg | Freq: Once | INTRAMUSCULAR | Status: AC
  Administered 2012-03-09: 2 mg via INTRAVENOUS
  Filled 2012-03-09: qty 2

## 2012-03-09 NOTE — MAU Note (Signed)
Pt G1 at 27.4wks with bleeding and passing a small clot.  Denies vag discharge or cramping.

## 2012-03-09 NOTE — MAU Provider Note (Signed)
History     CSN: [id number]  Arrival date and time: 03/09/12 0207   First Provider Initiated Contact with Patient 03/09/12 [phone]      Chief Complaint  Patient presents with  . Vaginal Bleeding   HPI [name] 21 y.o. female  G1P0000 at [redacted]w[redacted]d presenting with vaginal bleeding.   Patient had intercourse at midnight. 1 hour later when she went to the bathroom she noted vaginal bleeding "greater than my normal period" with "50 cent piece clot" x1. Patient states since that time bleeding has gradually slowed. On the way the hospital, patient noted some mild contractions every 5-10 minutes which she could also feel in her back.   Patient denies decreased fetal movement/ abnormal discharge/rush of fluid.    OB History    Grav Para Term Preterm Abortions TAB SAB Ect Mult Living   1 0 0 0 0 0 0 0 0 0       Past Medical History  Diagnosis Date  . No pertinent past medical history   . Urinary tract infection     Past Surgical History  Procedure Date  . Tonsillectomy   . Gallbladder surgery   . Bladder stem stretcher     History reviewed. No pertinent family history.  History  Substance Use Topics  . Smoking status: Never Smoker   . Smokeless tobacco: Not on file  . Alcohol Use: No    Allergies: No Known Allergies  Prescriptions prior to admission  Medication Sig Dispense Refill  . acetaminophen (TYLENOL) 325 MG tablet Take 650 mg by mouth every 6 (six) hours as needed.      . flintstones complete (FLINTSTONES) 60 MG chewable tablet Chew 2 tablets by mouth every morning.      . nitrofurantoin, macrocrystal-monohydrate, (MACROBID) 100 MG capsule Take 100 mg by mouth 2 (two) times daily.        ROS Physical Exam   Blood pressure 121/73, pulse 85, temperature 97.9 F (36.6 C), temperature source Oral, resp. rate 18, height 5\' 5"  (1.651 m), weight 67.132 kg (148 lb), last menstrual period 08/23/2011.  Physical Exam  Constitutional: She is oriented to person,  place, and time. She appears well-developed and well-nourished.  HENT:  Head: Normocephalic and atraumatic.  Mouth/Throat: No oropharyngeal exudate.  Eyes: Conjunctivae and EOM are normal. Pupils are equal, round, and reactive to light.  Neck: Normal range of motion. Neck supple.  Cardiovascular: Normal rate and regular rhythm.  Exam reveals no gallop and no friction rub.   No murmur heard. Respiratory: Effort normal and breath sounds normal. She has no wheezes. She has no rales.  GI: Soft. Bowel sounds are normal.       Gravid, size consistent with dates. No uterine tenderness.   Genitourinary:       On speculum exam, cervical abrasions noted around cervical os without laceration or active bleeding. Cervix visually long and closed. No vaginal lacerations noted.   Musculoskeletal: Normal range of motion. She exhibits no edema.  Neurological: She is alert and oriented to person, place, and time.  Skin: Skin is warm and dry.     Per visual inspection with speculum exam. Did not perform digital exam.  Dilation: Closed Effacement (%):  (LONG) Exam by:: DR Durene Cal  FHT: 140 baseline, moderate variability, 10x10 accels present, no decels Contractions every 5-7 minutes.   MAU Course  Procedures  MDM 03/09/2012 3:28 AM Cervix visually long and closed with appearance of abrasion. Given time course and exam, suspect cervical  cause of bleeding but will get ultrasound to rule out abruption.  Post coital contractions to be expected, if worsens, consider admission.   03/09/2012 4:40 AM Still pending u/s.  Patient has had no subsequent bleeding.  Contractions are becoming stronger to patient and noted every 2-3 minutes on u/s. Will give dose of procardia 10mg  x1 and vicodin x1 for pain. Of note, patient does not appear uncomfortable during contractions.   Contractions continued despite procardia. Please see H+P by Dr. Emelda Fear for further details  Assessment and Plan  [name] 21  y.o. female  G1P0000 at [redacted]w[redacted]d presenting with postcoital bleeding and contractions.   Admit. See H+P.    Bright Spielmann 03/09/2012, 3:21 AM

## 2012-03-09 NOTE — MAU Note (Signed)
FINISHED MED FOR YEAST INFECTION

## 2012-03-09 NOTE — MAU Note (Signed)
SAIYS SHE IS TAKING MACROBID FOR UTI- WAS IN HOSPITAL  3 WEEKS  AGO.  TONIGHT HAD SEX AT -  NOTICED BLEEDING AT 0100-  ON ARRIVAL- SAYS A LOT LESS THAN  EARLIER. IN RM 6- PAD ON  1 SMALL DOT 44F LIGHT PINK.

## 2012-03-09 NOTE — Progress Notes (Signed)
UR Chart review completed.  

## 2012-03-09 NOTE — H&P (Signed)
CSN: [id number]  Arrival date and time: 03/09/12 4098  First Provider Initiated Contact with Patient 03/09/12 [phone] Dr Annice Pih. Chief Complaint   Patient presents with   .  Vaginal Bleeding    HPI [name] 21 y.o. female G1P0000 at [redacted]w[redacted]d presenting with vaginal bleeding.  Patient had intercourse at midnight. 1 hour later when she went to the bathroom she noted vaginal bleeding "greater than my normal period" with "50 cent piece clot" x1. Patient states since that time bleeding has gradually slowed. On the way the hospital, patient noted some mild contractions every 5-10 minutes which she could also feel in her back.  Patient denies decreased fetal movement/ abnormal discharge/rush of fluid.  OB History    Grav  Para  Term  Preterm  Abortions  TAB  SAB  Ect  Mult  Living    1  0  0  0  0  0  0  0  0  0      Past Medical History   Diagnosis  Date   .  No pertinent past medical history    .  Urinary tract infection     Past Surgical History   Procedure  Date   .  Tonsillectomy    .  Gallbladder surgery    .  Bladder stem stretcher     History reviewed. No pertinent family history.  History   Substance Use Topics   .  Smoking status:  Never Smoker   .  Smokeless tobacco:  Not on file   .  Alcohol Use:  No    Allergies: No Known Allergies  Prescriptions prior to admission   Medication  Sig  Dispense  Refill   .  acetaminophen (TYLENOL) 325 MG tablet  Take 650 mg by mouth every 6 (six) hours as needed.     .  flintstones complete (FLINTSTONES) 60 MG chewable tablet  Chew 2 tablets by mouth every morning.     .  nitrofurantoin, macrocrystal-monohydrate, (MACROBID) 100 MG capsule  Take 100 mg by mouth 2 (two) times daily.      ROS  Physical Exam   Blood pressure 121/73, pulse 85, temperature 97.9 F (36.6 C), temperature source Oral, resp. rate 18, height 5\' 5"  (1.651 m), weight 67.132 kg (148 lb), last menstrual period 08/23/2011.  Physical Exam  Constitutional: She  is oriented to person, place, and time. She appears well-developed and well-nourished.  HENT:  Head: Normocephalic and atraumatic.  Mouth/Throat: No oropharyngeal exudate.  Eyes: Conjunctivae and EOM are normal. Pupils are equal, round, and reactive to light.  Neck: Normal range of motion. Neck supple.  Cardiovascular: Normal rate and regular rhythm. Exam reveals no gallop and no friction rub.  No murmur heard.  Respiratory: Effort normal and breath sounds normal. She has no wheezes. She has no rales.  GI: Soft. Bowel sounds are normal.  Gravid, size consistent with dates. No uterine tenderness.  Genitourinary:  On speculum exam, cervical abrasions noted around cervical os without laceration or active bleeding. Cervix visually long and closed. No vaginal lacerations noted.  Musculoskeletal: Normal range of motion. She exhibits no edema.  Neurological: She is alert and oriented to person, place, and time.  Skin: Skin is warm and dry.   Per visual inspection with speculum exam. Did not perform digital exam.  Dilation: Closed  Effacement (%): (LONG)  Exam by:: DR Durene Cal  FHT: 140 baseline, moderate variability, 10x10 accels present, no decels  Contractions every 5-7  minutes.  MAU Course   Procedures  MDM  03/09/2012 3:28 AM  Cervix visually long and closed with appearance of abrasion. Given time course and exam, suspect cervical cause of bleeding but will get ultrasound to rule out abruption.  Post coital contractions to be expected, if worsens, consider admission.  03/09/2012 4:40 AM  Still pending u/s.  Patient has had no subsequent bleeding.  Contractions are becoming stronger to patient and noted every 2-3 minutes on u/s. Will give dose of procardia 10mg  x1 and vicodin x1 for pain. Of note, patient does not appear uncomfortable during contractions.    Above notations by Dr Durene Cal  Continued monitoring shows continued contractions, now q 3 minutes, mild, perceived as more  uncomfortable by patient. No further bleeding Ultrasound photos reviewed in detail.  Pt has anterior placenta with no identified subplacental bleeding or periplacental fluid collections. Cervix is closed over 3.5 cm length as measured transabdominally. Leading edge of placenta is 5+ cm from internal os.  Due to the persistent preterm uterine contractions will admit for Magnesium sulfate therapy, plan to continue x 24 hours and give the BMZ 12 mg im q 24 hrws x 2, then consider d/c of Magnesium therapy.

## 2012-03-10 DIAGNOSIS — O4690 Antepartum hemorrhage, unspecified, unspecified trimester: Principal | ICD-10-CM

## 2012-03-10 DIAGNOSIS — O4700 False labor before 37 completed weeks of gestation, unspecified trimester: Secondary | ICD-10-CM

## 2012-03-10 LAB — MAGNESIUM: Magnesium: 5.7 mg/dL — ABNORMAL HIGH (ref 1.5–2.5)

## 2012-03-10 MED ORDER — PROMETHAZINE HCL 25 MG/ML IJ SOLN
12.5000 mg | Freq: Four times a day (QID) | INTRAMUSCULAR | Status: DC | PRN
  Administered 2012-03-10: 12.5 mg via INTRAVENOUS
  Filled 2012-03-10: qty 1

## 2012-03-10 MED ORDER — NIFEDIPINE ER 30 MG PO TB24
30.0000 mg | ORAL_TABLET | Freq: Two times a day (BID) | ORAL | Status: DC
  Administered 2012-03-10: 30 mg via ORAL
  Filled 2012-03-10 (×3): qty 1

## 2012-03-10 MED ORDER — NIFEDIPINE ER 30 MG PO TB24: 30.0000 mg | ORAL_TABLET | Freq: Two times a day (BID) | ORAL | Status: DC

## 2012-03-10 NOTE — Discharge Summary (Signed)
  DATE OF ADMISSION:  03/09/12  DATE OF DISCHARGE:  03/10/12  DIAGNOSES:  Post-coital bleeding, preterm contractions  ATTENDING:  Shelbie Proctor. Shawnie Pons, MD   SIGNIFICANT LABS AND STUDIES  OB SONO: Number of Fetuses: 1  Heart Rate: 147 bpm  Movement: Identified  Presentation: Cephalic  Placental Location: Anterior. No blood clot near or behind the placenta visualized.  Previa: Not identified.  Amniotic Fluid (Subjective): Within normal limits. Vertical pocket: 6.6cm  BPD: 70.9cm 28w 4d EDC: 05/28/2012   MATERNAL FINDINGS:  Cervix: Closed, 3.5 cm.  Uterus/Adnexae: Within normal limits.   IMPRESSION:  Single intrauterine gestation with cardiac activity and movement documented. Estimated age of 20 weeks 4 days by BPD.  Placenta demonstrates a normal sonographic appearance however  please note that placental abruption is a clinical diagnosis as  ultrasound has limited sensitivity.  Recommend followup with non-emergent complete OB 14+ wk US  examination for fetal biometric evaluation and anatomic survey if not already performed.    BRIEF CLINICAL RESUME: Ms. [name] is a 21 y.o. G1P0000 at [redacted]w[redacted]d presenting with vaginal bleeding and contractions. She was recently discharged from [facility] for pyelonephritis. Since contractions were present on tocometry every 5 to 7 minutes, the patient was admitted.  HOSPITAL COURSE: The patient was admitted to the antenatal unit and started on Magnesium Sulfate infusion and administration of antenatal corticosteroids with continuous fetal monitoring and tocometry. Sono showed no evidence of previa or abruption and cervix was long (3.8 cm) and closed.   She received two doses of betamethasone 24 hours apart. She was started on magnesium at 2 g per hour, which was titrated up to 2.5 grams per hour due to increasing frequency and intensity of contractions.  By AM on the day of discharge, the patient reported minimal contractions and no further vaginal  bleeding, though she still had irregular contractions on tocometry. She was started on Procardia, which she tolerated well. By afternoon of discharge, tocometry showed no contractions. Fetal heart tones were reactive and reassuring.   The patient was on Macrobid daily following an episode of pyelonephritis (discharged 02/21/12). Her dose was increased from daily to BID during hospital stay as the patient was complaining of dysuria. UA was negative.   DISCHARGE DATA: Regular diet, pelvic rest, follow up with Dr. Despina Hidden on 03/14/12 a scheduled.  Discharged on Procardia 30 mg BID Continue Macrobid 100 mg BID  Napoleon Form, MD 03/10/2012 4:45 PM

## 2012-03-10 NOTE — Progress Notes (Signed)
Spoke with dr Shawnie Pons. Ok to give procardia now

## 2012-03-10 NOTE — Plan of Care (Signed)
Problem: Consults Goal: Birthing Suites Patient Information Press F2 to bring up selections list   Pt < [redacted] weeks EGA     

## 2012-03-10 NOTE — Progress Notes (Signed)
Patient ID: [name], female   DOB: 1991-05-28, 21 y.o.   MRN: [id number] FACULTY PRACTICE ANTEPARTUM(COMPREHENSIVE) NOTE  [name] is a 21 y.o. G1P0000 at [redacted]w[redacted]d who is admitted for postcoital vaginal bleeding.   Fetal presentation is cephalic. Length of Stay:  1  Days  Subjective: Patient reports some cramping back pain yesterday evening similar to what she experienced on admission. The pain has since resolved and she is without complaints Patient reports the fetal movement as active. Patient reports uterine contraction  activity as none. Patient reports  vaginal bleeding as none. Patient describes fluid per vagina as None.  Vitals:  Blood pressure 93/50, pulse 91, temperature 97.9 F (36.6 C), temperature source Oral, resp. rate 12, height 5\' 5"  (1.651 m), weight 67.132 kg (148 lb), last menstrual period 08/23/2011, SpO2 99.00%. Physical Examination:  General appearance - alert, well appearing, and in no distress Fundal Height:  size equals dates Pelvic Exam:  n/a Cervical Exam: Evaluated by digital exam. and found to be closed/ Long thick/Floating and fetal presentation is unsure. Extremities: Homans sign is negative, no sign of DVT with DTRs 2+ bilaterally Membranes:intact  Fetal Monitoring:  Baseline: 130 bpm, Variability: Good {> 6 bpm), Accelerations: Non-reactive but appropriate for gestational age, Decelerations: Absent and Toco: no contractions  Labs:  Recent Results (from the past 24 hour(s))  URINE RAPID DRUG SCREEN (HOSP PERFORMED)   Collection Time   03/09/12  1:10 PM      Component Value Range   Opiates NONE DETECTED  NONE DETECTED   Cocaine NONE DETECTED  NONE DETECTED   Benzodiazepines NONE DETECTED  NONE DETECTED   Amphetamines NONE DETECTED  NONE DETECTED   Tetrahydrocannabinol NONE DETECTED  NONE DETECTED   Barbiturates NONE DETECTED  NONE DETECTED  MAGNESIUM   Collection Time   03/10/12  1:20 AM      Component Value Range   Magnesium 5.7  (*) 1.5 - 2.5 mg/dL    Imaging Studies:      Medications:  Scheduled    . betamethasone acetate-betamethasone sodium phosphate  12 mg Intramuscular Q24H  . docusate sodium  100 mg Oral Daily  .  HYDROmorphone (DILAUDID) injection  2 mg Intravenous Once  . nitrofurantoin (macrocrystal-monohydrate)  100 mg Oral BID  . prenatal multivitamin  1 tablet Oral Daily   I have reviewed the patient's current medications.  ASSESSMENT: Patient Active Problem List  Diagnosis  . Pyelonephritis during pregnancy, antepartum    PLAN: 21 yo G1P0 with postcoital vaginal bleeding in pregnancy. - Vaginal bleeding resolved - Patient received second dose of betamethasone this morning - Will discontinue magnesium sulfate and start procardia for tocolysis - Continue monitoring  Mychelle Kendra 03/10/2012,7:21 AM

## 2012-03-10 NOTE — Progress Notes (Signed)
Spoke with dr Shawnie Pons - will hold procardia for another hour. Then recheck BP at 1000 and call MD with results.

## 2012-03-10 NOTE — Progress Notes (Signed)
Spoke with dr Jolayne Panther about blood pressure, will hold procardia for 1 hr and recheck bp, pt very sleepy

## 2012-03-22 ENCOUNTER — Encounter (HOSPITAL_COMMUNITY): Payer: Self-pay | Admitting: *Deleted

## 2012-03-22 ENCOUNTER — Inpatient Hospital Stay (HOSPITAL_COMMUNITY): Payer: Medicaid Other

## 2012-03-22 ENCOUNTER — Inpatient Hospital Stay (HOSPITAL_COMMUNITY)
Admission: AD | Admit: 2012-03-22 | Discharge: 2012-03-22 | Disposition: A | Discharge status: Home / Self Care | Payer: Medicaid Other | Source: Ambulatory Visit | Attending: Obstetrics and Gynecology | Admitting: Obstetrics and Gynecology

## 2012-03-22 DIAGNOSIS — O4700 False labor before 37 completed weeks of gestation, unspecified trimester: Secondary | ICD-10-CM | POA: Insufficient documentation

## 2012-03-22 HISTORY — DX: Tubulo-interstitial nephritis, not specified as acute or chronic: N12

## 2012-03-22 LAB — URINALYSIS, ROUTINE W REFLEX MICROSCOPIC
Bilirubin Urine: NEGATIVE
Hgb urine dipstick: NEGATIVE
Specific Gravity, Urine: 1.01 (ref 1.005–1.030)
pH: 6 (ref 5.0–8.0)

## 2012-03-22 LAB — URINE MICROSCOPIC-ADD ON

## 2012-03-22 MED ORDER — ZOLPIDEM TARTRATE 5 MG PO TABS
5.0000 mg | ORAL_TABLET | Freq: Once | ORAL | Status: AC
  Administered 2012-03-22: 5 mg via ORAL
  Filled 2012-03-22: qty 1

## 2012-03-22 MED ORDER — NIFEDIPINE 10 MG PO CAPS
20.0000 mg | ORAL_CAPSULE | Freq: Once | ORAL | Status: AC
  Administered 2012-03-22: 20 mg via ORAL
  Filled 2012-03-22: qty 2

## 2012-03-22 MED ORDER — ACETAMINOPHEN 500 MG PO TABS
1000.0000 mg | ORAL_TABLET | Freq: Once | ORAL | Status: AC
  Administered 2012-03-22: 1000 mg via ORAL
  Filled 2012-03-22: qty 2

## 2012-03-22 MED ORDER — NIFEDIPINE ER OSMOTIC RELEASE 30 MG PO TB24
30.0000 mg | ORAL_TABLET | Freq: Once | ORAL | Status: AC
  Administered 2012-03-22: 30 mg via ORAL
  Filled 2012-03-22: qty 1

## 2012-03-22 MED ORDER — NIFEDIPINE 10 MG PO CAPS
10.0000 mg | ORAL_CAPSULE | Freq: Once | ORAL | Status: AC
  Administered 2012-03-22: 10 mg via ORAL
  Filled 2012-03-22: qty 1

## 2012-03-22 NOTE — MAU Provider Note (Signed)
  History     CSN: [id number]  Arrival date and time: 03/22/12 1522   None     Chief Complaint  Patient presents with  . Labor Eval   HPI This is a 21 y.o. female at [redacted]w[redacted]d who presents with c/o painful contractions last night and today. States they were worse on way to hospital, better now. Denies leaking or bleeding. After resident asked her several times she admits to intercourse last night.  UCs started after that.   Is on Procardia 30mg  bid with good results. Is hydrating well.  OB History    Grav Para Term Preterm Abortions TAB SAB Ect Mult Living   1 0 0 0 0 0 0 0 0 0       Past Medical History  Diagnosis Date  . No pertinent past medical history   . Urinary tract infection   . PONV (postoperative nausea and vomiting)   . Pyelonephritis     Past Surgical History  Procedure Date  . Tonsillectomy   . Gallbladder surgery   . Bladder stem stretcher     Family History  Problem Relation Age of Onset  . Other Neg Hx     History  Substance Use Topics  . Smoking status: Never Smoker   . Smokeless tobacco: Not on file  . Alcohol Use: No    Allergies: No Known Allergies  Prescriptions prior to admission  Medication Sig Dispense Refill  . acetaminophen (TYLENOL) 325 MG tablet Take 650 mg by mouth every 6 (six) hours as needed. headache      . flintstones complete (FLINTSTONES) 60 MG chewable tablet Chew 2 tablets by mouth every morning.      Marland Kitchen NIFEdipine (PROCARDIA-XL/ADALAT CC) 30 MG 24 hr tablet Take 1 tablet (30 mg total) by mouth 2 (two) times daily.  20 tablet  0  . nitrofurantoin, macrocrystal-monohydrate, (MACROBID) 100 MG capsule Take 100 mg by mouth 2 (two) times daily.        ROS As in HPI  Physical Exam   Blood pressure 111/86, pulse 97, temperature 98.3 F (36.8 C), temperature source Oral, resp. rate 16, height 5' 5.5" (1.664 m), weight 145 lb 3.2 oz (65.862 kg), last menstrual period 08/23/2011.  Physical Exam  Constitutional: She is oriented  to person, place, and time. She appears well-developed and well-nourished.  Cardiovascular: Normal rate.   Respiratory: Effort normal.  GI: Soft. There is no tenderness.  Genitourinary: Vagina normal and uterus normal. No vaginal discharge found.       Cervix closed/40-50%/-3  Musculoskeletal: Normal range of motion.  Neurological: She is alert and oriented to person, place, and time.  Skin: Skin is warm and dry.  Psychiatric: She has a normal mood and affect.   FHR reassuring, Irregular mild contractions every 4-6 minutes.  MAU Course  Procedures MDM Will give one single dose of Procardia.  Assessment and Plan  Still contracting after Procardia Will give a second dose of 20mg  this time Will check Cervical Length  Care turned over To Drenda Freeze C-Dishmon  [facility] 03/22/2012, 4:43 PM

## 2012-03-22 NOTE — MAU Provider Note (Signed)
  History     CSN: [id number]  Arrival date and time: 03/22/12 1522   None     Chief Complaint  Patient presents with  . Labor Eval   HPIPatient has returned from ultrasound with cervix 2.8 cm confirmed. Pt still having mild contractions of irregular intensity every 8-10 minutes.  Some are indentible , others not.  NO spotting or bleeding or gush of fluid.  Pt has been given her PM dose of Procardia 30 here while waiting.   Pt has had a very similar episode after sex 4 weeks ago, and was admitted, given Mag Sulfate x 24 hrs and BMZ, and these contractions are similar.     Past Medical History  Diagnosis Date  . No pertinent past medical history   . Urinary tract infection   . PONV (postoperative nausea and vomiting)   . Pyelonephritis     Past Surgical History  Procedure Date  . Tonsillectomy   . Gallbladder surgery   . Bladder stem stretcher     Family History  Problem Relation Age of Onset  . Other Neg Hx     History  Substance Use Topics  . Smoking status: Never Smoker   . Smokeless tobacco: Not on file  . Alcohol Use: No    Allergies: No Known Allergies  Prescriptions prior to admission  Medication Sig Dispense Refill  . acetaminophen (TYLENOL) 325 MG tablet Take 650 mg by mouth every 6 (six) hours as needed. headache      . flintstones complete (FLINTSTONES) 60 MG chewable tablet Chew 2 tablets by mouth every morning.      Marland Kitchen NIFEdipine (PROCARDIA-XL/ADALAT CC) 30 MG 24 hr tablet Take 1 tablet (30 mg total) by mouth 2 (two) times daily.  20 tablet  0  . nitrofurantoin, macrocrystal-monohydrate, (MACROBID) 100 MG capsule Take 100 mg by mouth 2 (two) times daily.        ROS Physical Exam   Blood pressure 116/71, pulse 93, temperature 98.3 F (36.8 C), temperature source Oral, resp. rate 16, height 5' 5.5" (1.664 m), weight 65.862 kg (145 lb 3.2 oz), last menstrual period 08/23/2011.  Physical ExamPhysical Examination: General appearance - alert, well  appearing, and in no distress Abdomen - soft, nontender, nondistended, no masses or organomegaly Gravid uterus , low in pelvis, nontender uterus between contractions, with variable intensity. Cx by u/s closed 2.8 cm length.  MAU Course  Procedures  MDM   Assessment and Plan  Pregnancy 29 weeks, recurrent uterine contractions post coitus., without cervical change. D/C home Continue procardia 30xl q 12 h, will take an additonal dose at 12 MN if contractions persist. Ambien at d/c [facility]  Jerran Tappan V 03/22/2012, 8:33 PM

## 2012-03-22 NOTE — MAU Provider Note (Signed)
Attestation of Attending Supervision of Advanced Practitioner: Evaluation and management procedures were performed by the PA/NP/CNM/OB Fellow under my supervision/collaboration. Chart reviewed and agree with management and plan.  Jedediah Noda V 03/22/2012 8:41 PM

## 2012-03-22 NOTE — MAU Note (Signed)
Patient states she has a history of preterm contractions. States contractions every 5 minutes. Denies any bleeding or leaking and reports good fetal movement.Has been hospitalized x 2 weeks for PTL.

## 2012-03-30 NOTE — MAU Provider Note (Signed)
Attestation of Attending Supervision of Advanced Practitioner: Evaluation and management procedures were performed by the PA/NP/CNM/OB Fellow under my supervision/collaboration. Chart reviewed and agree with management and plan.  Adolphus Hanf V 03/30/2012 8:46 PM    

## 2012-06-04 ENCOUNTER — Inpatient Hospital Stay (HOSPITAL_COMMUNITY)
Admission: AD | Admit: 2012-06-04 | Discharge: 2012-06-04 | Disposition: A | Discharge status: Home / Self Care | Payer: PRIVATE HEALTH INSURANCE | Source: Ambulatory Visit | Attending: Obstetrics & Gynecology | Admitting: Obstetrics & Gynecology

## 2012-06-04 ENCOUNTER — Encounter (HOSPITAL_COMMUNITY): Payer: Self-pay | Admitting: Obstetrics and Gynecology

## 2012-06-04 DIAGNOSIS — R51 Headache: Secondary | ICD-10-CM | POA: Insufficient documentation

## 2012-06-04 DIAGNOSIS — O479 False labor, unspecified: Secondary | ICD-10-CM

## 2012-06-04 LAB — CBC
HCT: 34.4 % — ABNORMAL LOW (ref 36.0–46.0)
Hemoglobin: 11.7 g/dL — ABNORMAL LOW (ref 12.0–15.0)
MCV: 89.8 fL (ref 78.0–100.0)
Platelets: 195 10*3/uL (ref 150–400)
RBC: 3.83 MIL/uL — ABNORMAL LOW (ref 3.87–5.11)
WBC: 11.5 10*3/uL — ABNORMAL HIGH (ref 4.0–10.5)

## 2012-06-04 LAB — COMPREHENSIVE METABOLIC PANEL
ALT: <5 U/L (ref 0–35)
AST: 13 U/L (ref 0–37)
Albumin: 2.6 g/dL — ABNORMAL LOW (ref 3.5–5.2)
Alkaline Phosphatase: 176 U/L — ABNORMAL HIGH (ref 39–117)
Potassium: 3.2 mEq/L — ABNORMAL LOW (ref 3.5–5.1)
Sodium: 137 mEq/L (ref 135–145)
Total Protein: 6.4 g/dL (ref 6.0–8.3)

## 2012-06-04 LAB — PROTEIN / CREATININE RATIO, URINE: Creatinine, Urine: 106.39 mg/dL

## 2012-06-04 MED ORDER — ACETAMINOPHEN 325 MG PO TABS
650.0000 mg | ORAL_TABLET | Freq: Once | ORAL | Status: AC
  Administered 2012-06-04: 650 mg via ORAL
  Filled 2012-06-04: qty 2

## 2012-06-04 NOTE — MAU Note (Signed)
Pt reports having some vaginal bleeding when she went to the [facility]  Notice a clot in toilet as well. Having back pain and occasion contractions. Repots good fetal movement.

## 2012-06-04 NOTE — MAU Provider Note (Signed)
Chief Complaint:  Labor Eval  HPI: [name] is a 21 y.o. G1P0000 at [redacted]w[redacted]d who presents to maternity admissions reporting bright red blood in the toilet when urinating this morning. She also complaints of headaches stating this morning as well. Denies painful contractions, leakage of fluid. Good fetal movement.   Pregnancy Course: Prenatal care at [facility]. Pregnancies issues include Pyelonephritis and uterine irritability. Normal placenta insertion per OB U/S report/  Past Medical History: Past Medical History  Diagnosis Date  . No pertinent past medical history   . Urinary tract infection   . PONV (postoperative nausea and vomiting)   . Pyelonephritis     Past obstetric history: OB History    Grav Para Term Preterm Abortions TAB SAB Ect Mult Living   1 0 0 0 0 0 0 0 0 0       Past Surgical History: Past Surgical History  Procedure Date  . Tonsillectomy   . Gallbladder surgery   . Bladder stem stretcher     Family History: Family History  Problem Relation Age of Onset  . Other Neg Hx     Social History: History  Substance Use Topics  . Smoking status: Never Smoker   . Smokeless tobacco: Not on file  . Alcohol Use: No    Allergies: No Known Allergies  Meds:  Prescriptions prior to admission  Medication Sig Dispense Refill  . acetaminophen (TYLENOL) 325 MG tablet Take 650 mg by mouth every 6 (six) hours as needed. headache      . flintstones complete (FLINTSTONES) 60 MG chewable tablet Chew 2 tablets by mouth every morning.        ROS: Pertinent findings in history of present illness.  Physical Exam  Blood pressure 130/92, pulse 85, resp. rate 18, height 5\' 5"  (1.651 m), weight 158 lb 3.2 oz (71.759 kg), last menstrual period 08/23/2011. GENERAL: Well-developed, well-nourished female in no acute distress.  HEENT: normocephalic HEART: normal rate RESP: normal effort ABDOMEN: Soft, non-tender, gravid appropriate for gestational age EXTREMITIES:  Nontender, no edema NEURO: alert and oriented SPECULUM EXAM: NEFG, physiologic discharge, no blood, cervix with mucus plug present. No active bleeding. Dilation: Fingertip Effacement (%): Thick Cervical Position: Posterior Station: Ballotable;-3 Presentation: Vertex Exam by:: Dr. Aviva Signs   FHT:  Baseline 145 , moderate variability, accelerations present, no decelerations Contractions: irregular.    Labs: Results for orders placed during the hospital encounter of 06/04/12 (from the past 24 hour(s))  COMPREHENSIVE METABOLIC PANEL     Status: Abnormal   Collection Time   06/04/12 11:05 AM      Component Value Range   Sodium 137  135 - 145 mEq/L   Potassium 3.2 (*) 3.5 - 5.1 mEq/L   Chloride 103  96 - 112 mEq/L   CO2 23  19 - 32 mEq/L   Glucose, Bld 74  70 - 99 mg/dL   BUN 5 (*) 6 - 23 mg/dL   Creatinine, Ser 1.61  0.50 - 1.10 mg/dL   Calcium 8.6  8.4 - 09.6 mg/dL   Total Protein 6.4  6.0 - 8.3 g/dL   Albumin 2.6 (*) 3.5 - 5.2 g/dL   AST 13  0 - 37 U/L   ALT <5  0 - 35 U/L   Alkaline Phosphatase 176 (*) 39 - 117 U/L   Total Bilirubin 0.3  0.3 - 1.2 mg/dL   GFR calc non Af Amer >90  >90 mL/min   GFR calc Af Amer >90  >90 mL/min  CBC     Status: Abnormal   Collection Time   06/04/12 11:05 AM      Component Value Range   WBC 11.5 (*) 4.0 - 10.5 K/uL   RBC 3.83 (*) 3.87 - 5.11 MIL/uL   Hemoglobin 11.7 (*) 12.0 - 15.0 g/dL   HCT 16.1 (*) 09.6 - 04.5 %   MCV 89.8  78.0 - 100.0 fL   MCH 30.5  26.0 - 34.0 pg   MCHC 34.0  30.0 - 36.0 g/dL   RDW 40.9  81.1 - 91.4 %   Platelets 195  150 - 400 K/uL  PROTEIN / CREATININE RATIO, URINE     Status: Normal   Collection Time   06/04/12 12:07 PM      Component Value Range   Creatinine, Urine 106.39     Total Protein, Urine 10.3     PROTEIN CREATININE RATIO 0.10  0.00 - 0.15    Imaging:  No results found. MAU Course: The bleeding reported seems to be mucus plug. No active bleeding on speculum exam.  Pt with HA and Blood pressures  borderline high. Will obtain CBC, Cmet, Urine-prot ratio and will pend management upon results. First vaginal exam cervix was thick, posterior, fingertip around 10:00am. 4h later is as follows Dilation: 2 Effacement (%): 50 Cervical Position: Posterior Station: -2 Presentation: Vertex Exam by:: Dr Aviva Signs resident Cervix changes in but still on latent phase of labor.  Fetal tracings category II. Maternal contractions irregular. Labs wnl Assessment: 1. False labor   2. Headache  Plan: Discharge home F/u at [facility] in 1-2 days after discharge. Labor precautions and fetal kick counts    Medication List     As of 06/04/2012  3:00 PM    TAKE these medications         acetaminophen 325 MG tablet   Commonly known as: TYLENOL   Take 650 mg by mouth every 6 (six) hours as needed. headache      flintstones complete 60 MG chewable tablet   Chew 2 tablets by mouth every morning.         Sean Malinowski [place], MD 06/04/2012 11:06 AM

## 2012-06-04 NOTE — MAU Provider Note (Signed)
EFM category I, no evidence of bleeding or active labor at this time.   I saw and examined patient along with student and agree with above note.   [name] 06/04/2012 5:16 PM

## 2012-06-04 NOTE — MAU Note (Addendum)
Error

## 2012-06-05 ENCOUNTER — Inpatient Hospital Stay (HOSPITAL_COMMUNITY)
Admission: AD | Admit: 2012-06-05 | Discharge: 2012-06-08 | DRG: 775 | Disposition: A | Discharge status: Home / Self Care | Payer: PRIVATE HEALTH INSURANCE | Source: Ambulatory Visit | Attending: Obstetrics & Gynecology | Admitting: Obstetrics & Gynecology

## 2012-06-05 ENCOUNTER — Encounter (HOSPITAL_COMMUNITY): Payer: Self-pay

## 2012-06-05 DIAGNOSIS — O169 Unspecified maternal hypertension, unspecified trimester: Secondary | ICD-10-CM

## 2012-06-05 DIAGNOSIS — O133 Gestational [pregnancy-induced] hypertension without significant proteinuria, third trimester: Secondary | ICD-10-CM

## 2012-06-05 HISTORY — DX: Unspecified maternal hypertension, unspecified trimester: O16.9

## 2012-06-05 NOTE — MAU Provider Note (Signed)
Attestation of Attending Supervision of Advanced Practitioner (CNM/NP): Evaluation and management procedures were performed by the Advanced Practitioner under my supervision and collaboration.  I have reviewed the Advanced Practitioner's note and chart, and I agree with the management and plan.  UGONNA  ANYANWU, MD, FACOG Attending Obstetrician & Gynecologist Faculty Practice, [facility]   

## 2012-06-05 NOTE — MAU Note (Signed)
Pt G1 at 40.1wks having contractions all day and bloody show.  Denies leaking fluid.

## 2012-06-05 NOTE — MAU Provider Note (Signed)
  History     CSN: [id number]  Arrival date and time: 06/05/12 2046   None     Chief Complaint  Patient presents with  . Labor Eval   HPI This is a 21 y.o. female at [redacted]w[redacted]d who presents for labor evaluation. Denies leaking or bleeding and reports + fetal movement.   Was seen here last night also and had a PIH workup which was negative.   OB History    Grav Para Term Preterm Abortions TAB SAB Ect Mult Living   1 0 0 0 0 0 0 0 0 0       Past Medical History  Diagnosis Date  . No pertinent past medical history   . Urinary tract infection   . PONV (postoperative nausea and vomiting)   . Pyelonephritis     Past Surgical History  Procedure Date  . Tonsillectomy   . Gallbladder surgery   . Bladder stem stretcher     Family History  Problem Relation Age of Onset  . Other Neg Hx     History  Substance Use Topics  . Smoking status: Never Smoker   . Smokeless tobacco: Not on file  . Alcohol Use: No    Allergies: No Known Allergies  Prescriptions prior to admission  Medication Sig Dispense Refill  . acetaminophen (TYLENOL) 325 MG tablet Take 650 mg by mouth every 6 (six) hours as needed. headache      . flintstones complete (FLINTSTONES) 60 MG chewable tablet Chew 2 tablets by mouth every morning.        ROS See HPI  Physical Exam   Blood pressure 141/96, pulse 88, temperature 98.2 F (36.8 C), temperature source Oral, resp. rate 18, height 5\' 5"  (1.651 m), weight 162 lb 6.4 oz (73.664 kg), last menstrual period 08/23/2011.  Physical Exam  Constitutional: She is oriented to person, place, and time. She appears well-developed and well-nourished. No distress.  Cardiovascular: Normal rate.   Respiratory: Effort normal.  GI: Soft. She exhibits no distension and no mass. There is no tenderness. There is no rebound and no guarding.  Genitourinary: Vagina normal and uterus normal.       Dilation: 2.5 Effacement (%): 70;80 Cervical Position: Anterior Station:  -3 Presentation: Vertex Exam by:: Lucy Chris RNC/Danielle Simpson RN   Musculoskeletal: Normal range of motion. She exhibits no edema.  Neurological: She is alert and oriented to person, place, and time.  Skin: Skin is warm and dry.  Psychiatric: She has a normal mood and affect.    MAU Course  Procedures  MDM Will recheck cervix in one hour  Assessment and Plan  A:  SIUP at [redacted]w[redacted]d       Contractions, r/o labor P:  Observe and recheck  [facility] 06/05/2012, 10:03 PM

## 2012-06-06 ENCOUNTER — Inpatient Hospital Stay (HOSPITAL_COMMUNITY): Payer: PRIVATE HEALTH INSURANCE | Admitting: Anesthesiology

## 2012-06-06 ENCOUNTER — Encounter (HOSPITAL_COMMUNITY): Payer: Self-pay | Admitting: Obstetrics & Gynecology

## 2012-06-06 ENCOUNTER — Encounter (HOSPITAL_COMMUNITY): Payer: Self-pay | Admitting: Anesthesiology

## 2012-06-06 DIAGNOSIS — O139 Gestational [pregnancy-induced] hypertension without significant proteinuria, unspecified trimester: Secondary | ICD-10-CM

## 2012-06-06 DIAGNOSIS — O169 Unspecified maternal hypertension, unspecified trimester: Secondary | ICD-10-CM

## 2012-06-06 HISTORY — DX: Unspecified maternal hypertension, unspecified trimester: O16.9

## 2012-06-06 LAB — CBC
HCT: 32.5 % — ABNORMAL LOW (ref 36.0–46.0)
Hemoglobin: 10.5 g/dL — ABNORMAL LOW (ref 12.0–15.0)
MCH: 31 pg (ref 26.0–34.0)
MCH: 30.5 pg (ref 26.0–34.0)
MCV: 89.3 fL (ref 78.0–100.0)
MCV: 89.8 fL (ref 78.0–100.0)
Platelets: 207 10*3/uL (ref 150–400)
RBC: 3.44 MIL/uL — ABNORMAL LOW (ref 3.87–5.11)
RDW: 14 % (ref 11.5–15.5)
WBC: 15.8 10*3/uL — ABNORMAL HIGH (ref 4.0–10.5)

## 2012-06-06 MED ORDER — EPHEDRINE 5 MG/ML INJ
10.0000 mg | INTRAVENOUS | Status: DC | PRN
  Filled 2012-06-06: qty 4

## 2012-06-06 MED ORDER — PHENYLEPHRINE 40 MCG/ML (10ML) SYRINGE FOR IV PUSH (FOR BLOOD PRESSURE SUPPORT): 80.0000 ug | PREFILLED_SYRINGE | INTRAVENOUS | Status: DC | PRN

## 2012-06-06 MED ORDER — ONDANSETRON HCL 4 MG/2ML IJ SOLN: 4.0000 mg | INTRAMUSCULAR | Status: DC | PRN

## 2012-06-06 MED ORDER — BENZOCAINE-MENTHOL 20-0.5 % EX AERO
1.0000 "application " | INHALATION_SPRAY | CUTANEOUS | Status: DC | PRN
  Administered 2012-06-06: 1 via TOPICAL
  Filled 2012-06-06: qty 56

## 2012-06-06 MED ORDER — OXYTOCIN 40 UNITS IN LACTATED RINGERS INFUSION - SIMPLE MED: 1.0000 m[IU]/min | INTRAVENOUS | Status: DC

## 2012-06-06 MED ORDER — ONDANSETRON HCL 4 MG/2ML IJ SOLN
4.0000 mg | Freq: Four times a day (QID) | INTRAMUSCULAR | Status: DC | PRN
  Administered 2012-06-06: 4 mg via INTRAVENOUS
  Filled 2012-06-06: qty 2

## 2012-06-06 MED ORDER — LIDOCAINE HCL (PF) 1 % IJ SOLN
INTRAMUSCULAR | Status: DC | PRN
  Administered 2012-06-06 (×2): 5 mL

## 2012-06-06 MED ORDER — CITRIC ACID-SODIUM CITRATE 334-500 MG/5ML PO SOLN: 30.0000 mL | ORAL | Status: DC | PRN

## 2012-06-06 MED ORDER — DIBUCAINE 1 % RE OINT
1.0000 "application " | TOPICAL_OINTMENT | RECTAL | Status: DC | PRN
  Administered 2012-06-06: 1 via RECTAL
  Filled 2012-06-06: qty 28

## 2012-06-06 MED ORDER — OXYCODONE-ACETAMINOPHEN 5-325 MG PO TABS: 1.0000 | ORAL_TABLET | ORAL | Status: DC | PRN

## 2012-06-06 MED ORDER — TETANUS-DIPHTH-ACELL PERTUSSIS 5-2.5-18.5 LF-MCG/0.5 IM SUSP
0.5000 mL | Freq: Once | INTRAMUSCULAR | Status: AC
  Administered 2012-06-08: 0.5 mL via INTRAMUSCULAR
  Filled 2012-06-06: qty 0.5

## 2012-06-06 MED ORDER — FENTANYL 2.5 MCG/ML BUPIVACAINE 1/10 % EPIDURAL INFUSION (WH - ANES)
14.0000 mL/h | INTRAMUSCULAR | Status: DC
  Administered 2012-06-06 (×2): 14 mL/h via EPIDURAL
  Filled 2012-06-06 (×2): qty 125

## 2012-06-06 MED ORDER — DIPHENHYDRAMINE HCL 50 MG/ML IJ SOLN: 12.5000 mg | INTRAMUSCULAR | Status: DC | PRN

## 2012-06-06 MED ORDER — FLEET ENEMA 7-19 GM/118ML RE ENEM: 1.0000 | ENEMA | RECTAL | Status: DC | PRN

## 2012-06-06 MED ORDER — TERBUTALINE SULFATE 1 MG/ML IJ SOLN: 0.2500 mg | Freq: Once | INTRAMUSCULAR | Status: DC | PRN

## 2012-06-06 MED ORDER — PHENYLEPHRINE 40 MCG/ML (10ML) SYRINGE FOR IV PUSH (FOR BLOOD PRESSURE SUPPORT)
80.0000 ug | PREFILLED_SYRINGE | INTRAVENOUS | Status: DC | PRN
  Filled 2012-06-06: qty 5

## 2012-06-06 MED ORDER — OXYTOCIN 40 UNITS IN LACTATED RINGERS INFUSION - SIMPLE MED
62.5000 mL/h | INTRAVENOUS | Status: DC
  Administered 2012-06-06: 62.5 mL/h via INTRAVENOUS
  Filled 2012-06-06: qty 1000

## 2012-06-06 MED ORDER — LACTATED RINGERS IV SOLN: 500.0000 mL | INTRAVENOUS | Status: DC | PRN

## 2012-06-06 MED ORDER — ONDANSETRON HCL 4 MG PO TABS: 4.0000 mg | ORAL_TABLET | ORAL | Status: DC | PRN

## 2012-06-06 MED ORDER — ZOLPIDEM TARTRATE 5 MG PO TABS: 5.0000 mg | ORAL_TABLET | Freq: Every evening | ORAL | Status: DC | PRN

## 2012-06-06 MED ORDER — WITCH HAZEL-GLYCERIN EX PADS
1.0000 "application " | MEDICATED_PAD | CUTANEOUS | Status: DC | PRN
  Administered 2012-06-06: 1 via TOPICAL

## 2012-06-06 MED ORDER — OXYCODONE-ACETAMINOPHEN 5-325 MG PO TABS
1.0000 | ORAL_TABLET | ORAL | Status: DC | PRN
Start: 2012-06-06 — End: 2012-06-06

## 2012-06-06 MED ORDER — LIDOCAINE HCL (PF) 1 % IJ SOLN
30.0000 mL | INTRAMUSCULAR | Status: DC | PRN
  Filled 2012-06-06: qty 30

## 2012-06-06 MED ORDER — LACTATED RINGERS IV SOLN
INTRAVENOUS | Status: DC
  Administered 2012-06-06 (×2): via INTRAVENOUS

## 2012-06-06 MED ORDER — IBUPROFEN 600 MG PO TABS
600.0000 mg | ORAL_TABLET | Freq: Four times a day (QID) | ORAL | Status: DC | PRN
  Administered 2012-06-06: 600 mg via ORAL
  Filled 2012-06-06: qty 1

## 2012-06-06 MED ORDER — IBUPROFEN 600 MG PO TABS
600.0000 mg | ORAL_TABLET | Freq: Four times a day (QID) | ORAL | Status: DC
  Administered 2012-06-06 – 2012-06-08 (×6): 600 mg via ORAL
  Filled 2012-06-06 (×6): qty 1

## 2012-06-06 MED ORDER — EPHEDRINE 5 MG/ML INJ: 10.0000 mg | INTRAVENOUS | Status: DC | PRN

## 2012-06-06 MED ORDER — DIPHENHYDRAMINE HCL 25 MG PO CAPS: 25.0000 mg | ORAL_CAPSULE | Freq: Four times a day (QID) | ORAL | Status: DC | PRN

## 2012-06-06 MED ORDER — LACTATED RINGERS IV SOLN: 500.0000 mL | Freq: Once | INTRAVENOUS | Status: DC

## 2012-06-06 MED ORDER — PRENATAL MULTIVITAMIN CH
1.0000 | ORAL_TABLET | Freq: Every day | ORAL | Status: DC
  Administered 2012-06-07: 1 via ORAL
  Filled 2012-06-06 (×2): qty 1

## 2012-06-06 MED ORDER — SENNOSIDES-DOCUSATE SODIUM 8.6-50 MG PO TABS
2.0000 | ORAL_TABLET | Freq: Every day | ORAL | Status: DC
  Administered 2012-06-06 – 2012-06-07 (×2): 2 via ORAL

## 2012-06-06 MED ORDER — OXYTOCIN BOLUS FROM INFUSION: 500.0000 mL | INTRAVENOUS | Status: DC

## 2012-06-06 MED ORDER — LANOLIN HYDROUS EX OINT: TOPICAL_OINTMENT | CUTANEOUS | Status: DC | PRN

## 2012-06-06 MED ORDER — ACETAMINOPHEN 325 MG PO TABS: 650.0000 mg | ORAL_TABLET | ORAL | Status: DC | PRN

## 2012-06-06 MED ORDER — SIMETHICONE 80 MG PO CHEW: 80.0000 mg | CHEWABLE_TABLET | ORAL | Status: DC | PRN

## 2012-06-06 NOTE — Progress Notes (Signed)
S: Doing well, pain well-controlled with epidural. No pelvic pressure present.  Good fetal movement.  O: Filed Vitals:   06/06/12 1002 06/06/12 1005 06/06/12 1032 06/06/12 1057  BP: 137/91 137/91 127/72 121/83  Pulse: 77 77 73 74  Temp:  97.9 F (36.6 C)    TempSrc:  Oral    Resp:      Height:      Weight:         FHT:  FHR: 115 bpm, variability: moderate,  accelerations:  Present,  decelerations:  Absent UC:   irregular, every 2-5 minutes SVE:   Dilation: 6.5 Effacement (%): 100 Station: -2 Exam by: Raelyn Mora, SNM   A / P: Spontaneous labor, progressing slowly Start pitocin increase by 37mU/2mins  Fetal Wellbeing:  Category I Pain Control:  Epidural  Anticipated MOD:  NSVD  Raelyn Mora, SNM 06/06/2012, 11:31 AM Supervised by: Caren Griffins, CNM  Evaluation and management procedures were performed by SNM under my supervision/collaboration. Chart reviewed, patient examined by me and I agree with management and plan. Danae Orleans, CNM 06/06/2012 4:03 PM

## 2012-06-06 NOTE — Anesthesia Procedure Notes (Signed)
Epidural Patient location during procedure: OB Start time: 06/06/2012 3:14 AM  Staffing Anesthesiologist: Brayton Caves R Performed by: anesthesiologist   Preanesthetic Checklist Completed: patient identified, site marked, surgical consent, pre-op evaluation, timeout performed, IV checked, risks and benefits discussed and monitors and equipment checked  Epidural Patient position: sitting Prep: site prepped and draped and DuraPrep Patient monitoring: continuous pulse ox and blood pressure Approach: midline Injection technique: LOR air and LOR saline  Needle:  Needle type: Tuohy  Needle gauge: 17 G Needle length: 9 cm and 9 Needle insertion depth: 5 cm cm Catheter type: closed end flexible Catheter size: 19 Gauge Catheter at skin depth: 10 cm Test dose: negative  Assessment Events: blood not aspirated, injection not painful, no injection resistance, negative IV test and no paresthesia  Additional Notes Patient identified.  Risk benefits discussed including failed block, incomplete pain control, headache, nerve damage, paralysis, blood pressure changes, nausea, vomiting, reactions to medication both toxic or allergic, and postpartum back pain.  Patient expressed understanding and wished to proceed.  All questions were answered.  Sterile technique used throughout procedure and epidural site dressed with sterile barrier dressing. No paresthesia or other complications noted.The patient did not experience any signs of intravascular injection such as tinnitus or metallic taste in mouth nor signs of intrathecal spread such as rapid motor block. Please see nursing notes for vital signs.

## 2012-06-06 NOTE — Progress Notes (Addendum)
LATE ENTRY OF NOTE. PT EVALUATED AROUND 0530  [name] is a 21 y.o. G1P0000 at [redacted]w[redacted]d  Subjective: Pt comfortable on epidural. Some low abdominal pressure with contractions.  Objective: BP 134/85  Pulse 74  Temp 98 F (36.7 C) (Oral)  Resp 18  Ht 5\' 5"  (1.651 m)  Wt 73.664 kg (162 lb 6.4 oz)  BMI 27.02 kg/m2  LMP 08/23/2011      FHT:  FHR: 140 bpm, variability: moderate,  accelerations:  Present,  decelerations:  Absent UC:   regular, every 5-10 minutes SVE:   Dilation: 5 Effacement (%): 100 Station: -1 Exam by:: LCarpenter,Rn  Labs: Lab Results  Component Value Date   WBC 14.9* 06/06/2012   HGB 11.3* 06/06/2012   HCT 32.5* 06/06/2012   MCV 89.3 06/06/2012   PLT 207 06/06/2012    Assessment / Plan: Spontaneous labor, progressing; last checked by RN around 0500, some change in cervix to 5 cm BP's slightly improved (120's-130's systolic)  Labor: Progressing, will consider Pitocin augmentation Preeclampsia:  BP's elevated but no current need for mag Fetal Wellbeing:  Category I Pain Control:  Epidural Anticipated MOD:  NSVD  Yusuke Beza, Cristal Deer 06/06/2012, 6:26 AM

## 2012-06-06 NOTE — Progress Notes (Signed)
S: Doing well, pain well-controlled with epidural.  (+) rectal pressure with contractions. Vomiting.  O: Filed Vitals:   06/06/12 1332 06/06/12 1350 06/06/12 1402 06/06/12 1432  BP: 147/127 126/82 124/73 127/79  Pulse: 128 103 96 89  Temp:    98.1 F (36.7 C)  TempSrc:    Oral  Resp:      Height:      Weight:         FHT:  FHR: 130 bpm, variability: moderate,  accelerations:  Present,  decelerations:  Absent UC:   regular, every 2-3 minutes SVE:   Dilation: 10 Effacement (%): 100 Station: +2;+1 Exam by:: Arita Miss, SNM   A / P: Spontaneous labor, progressing normally  Pitocin Fetal Wellbeing:  Category I Pain Control:  Epidural  Anticipated MOD:  NSVD  Raelyn Mora, SNM 06/06/2012, 1:47 PM Supervised by: Caren Griffins, CNM  Evaluation and management procedures were performed by SNM under my supervision/collaboration. Chart reviewed, patient examined by me and I agree with management and plan. Danae Orleans, CNM 06/06/2012 4:02 PM

## 2012-06-06 NOTE — H&P (Signed)
[name] is a 21 y.o. female at [redacted]w[redacted]d who presents for labor evaluation. Denies leaking or bleeding and reports + fetal movement. Was seen here last night also and had a PIH workup which was negative. Now with some increased BP but not in severe range.  History OB History    Grav Para Term Preterm Abortions TAB SAB Ect Mult Living   1 0 0 0 0 0 0 0 0 0      Past Medical History  Diagnosis Date  . No pertinent past medical history   . Urinary tract infection   . PONV (postoperative nausea and vomiting)   . Pyelonephritis    Past Surgical History  Procedure Date  . Tonsillectomy   . Gallbladder surgery   . Bladder stem stretcher    Family History: family history is negative for Other. Social History:  reports that she has never smoked. She does not have any smokeless tobacco history on file. She reports that she does not drink alcohol or use illicit drugs.  ROS: see HPI  Dilation: 3 Effacement (%): 80;90 Station: -2 Exam by:: Lucy Chris RNC Blood pressure 149/96, pulse 86, temperature 98.2 F (36.8 C), temperature source Oral, resp. rate 18, height 5\' 5"  (1.651 m), weight 73.664 kg (162 lb 6.4 oz), last menstrual period 08/23/2011. Exam Physical Exam  Constitutional: She is oriented to person, place, and time. She appears well-developed and well-nourished. No distress.  Cardiovascular: Normal rate.   Respiratory: Effort normal.  GI: Soft. She exhibits no distension and no mass. There is no tenderness. There is no rebound and no guarding.  Genitourinary: Vagina normal and uterus normal.       Dilation: 3 Effacement (%): 80;90 Cervical Position: Anterior Station: -2 Presentation: Vertex Exam by:: Lucy Chris RNC   Musculoskeletal: Normal range of motion. She exhibits no edema.  Neurological: She is alert and oriented to person, place, and time.  Skin: Skin is warm and dry.  Psychiatric: She has a normal mood and affect. Her behavior is normal.    Prenatal  labs: ABO, Rh: --/--/O POS (08/29 9562) Antibody: Negative (04/02 0000) Rubella: Immune (04/02 0000) RPR: Nonreactive (04/02 0000)  HBsAg: Negative (04/02 0000)  HIV: Non-reactive (04/02 0000)  GBS:   NEGATIVE  Assessment/Plan: A: SIUP at [redacted]w[redacted]d -contractions, possible early labor -BP elevated, labs drawn about 24 hours ago not concerning for pre-E; not likely needed to be redrawn  P: Admit to birthing suites -routine admit orders -monitor for progression, consider augmenting -will consider epidural pending progression  Bettie Capistran, Cristal Deer 06/06/2012, 1:56 AM

## 2012-06-06 NOTE — Anesthesia Preprocedure Evaluation (Signed)
Anesthesia Evaluation  Patient identified by MRN, date of birth, ID band Patient awake    Reviewed: Allergy & Precautions, H&P , Patient's Chart, lab work & pertinent test results  History of Anesthesia Complications (+) PONV  Airway Mallampati: II TM Distance: >3 FB Neck ROM: full    Dental No notable dental hx.    Pulmonary neg pulmonary ROS,  breath sounds clear to auscultation  Pulmonary exam normal       Cardiovascular hypertension, negative cardio ROS  Rhythm:regular Rate:Normal     Neuro/Psych negative neurological ROS  negative psych ROS   GI/Hepatic negative GI ROS, Neg liver ROS,   Endo/Other  negative endocrine ROS  Renal/GU Renal diseasenegative Renal ROS     Musculoskeletal   Abdominal   Peds  Hematology negative hematology ROS (+)   Anesthesia Other Findings PONV (postoperative nausea and vomiting)     No pertinent past medical history        Urinary tract infection     Pyelonephritis    Reproductive/Obstetrics (+) Pregnancy                           Anesthesia Physical Anesthesia Plan  ASA: III  Anesthesia Plan: Epidural   Post-op Pain Management:    Induction:   Airway Management Planned:   Additional Equipment:   Intra-op Plan:   Post-operative Plan:   Informed Consent: I have reviewed the patients History and Physical, chart, labs and discussed the procedure including the risks, benefits and alternatives for the proposed anesthesia with the patient or authorized representative who has indicated his/her understanding and acceptance.     Plan Discussed with:   Anesthesia Plan Comments:         Anesthesia Quick Evaluation

## 2012-06-06 NOTE — Progress Notes (Signed)
Patient ID: [name], female   DOB: Apr 30, 1991, 21 y.o.   MRN: [id number] [name] is a 21 y.o. G1P0000 at [redacted]w[redacted]d admitted for SOL  Subjective: Comfortable with epidural in place. Vomiting.  Objective: BP 118/80  Pulse 72  Temp 98 F (36.7 C) (Oral)  Resp 18  Ht 5\' 5"  (1.651 m)  Wt 162 lb 6.4 oz (73.664 kg)  BMI 27.02 kg/m2  LMP 08/23/2011  Fetal Heart FHR: 115-120 bpm, variability: moderate,  accelerations:  Present,  decelerations:  Absent   Contractions: irregular q 3-6  SVE:   Dilation: 5 Effacement (%): 100 Station: -1 Exam by:: LCarpenter,Rn Dilation: 6 Effacement (%): 100 Cervical Position: Middle Station: -1 Presentation: Vertex Exam by:: d. Othmar Ringer, cnm VE: 6/90/-2 AROM small amt clear AF; mod bloody show  Assessment / Plan:  Labor: Active with suboptimal UC pattern -> recheck in 1-2 hr Fetal Wellbeing: Category 1 FHR Pain Control:  adequate Expected mode of delivery: NSVD  Charnele Semple 06/06/2012, 9:01 AM

## 2012-06-07 LAB — CBC
Hemoglobin: 9.1 g/dL — ABNORMAL LOW (ref 12.0–15.0)
MCH: 30.7 pg (ref 26.0–34.0)
MCHC: 34.3 g/dL (ref 30.0–36.0)
RDW: 14.3 % (ref 11.5–15.5)

## 2012-06-07 NOTE — Progress Notes (Signed)
Post Partum Day #1 NSVD Subjective: no complaints, up ad lib, voiding and having issues with infant feeeding. Plans Depo.  Objective: Blood pressure 118/76, pulse 78, temperature 97.7 F (36.5 C), temperature source Oral, resp. rate 18, height 5\' 5"  (1.651 m), weight 73.664 kg (162 lb 6.4 oz), last menstrual period 08/23/2011, unknown if currently breastfeeding.  Physical Exam:  General: alert, cooperative, fatigued and no distress Lochia: appropriate Uterine Fundus: firm Incision: n/a DVT Evaluation: No evidence of DVT seen on physical exam.   Basename 06/07/12 0510 06/06/12 1715  HGB 9.1* 10.5*  HCT 26.5* 30.9*    Assessment/Plan: Plan for discharge tomorrow   LOS: 2 days   [name] 06/07/2012, 8:10 AM

## 2012-06-07 NOTE — H&P (Signed)
Attestation of Attending Supervision of Resident: Evaluation and management procedures were performed by the Family Medicine Resident under my supervision.  I have seen and examined the patient, reviewed the resident's note and chart, and I agree with the management and plan.  Duanna Runk, MD, FACOG Attending Obstetrician & Gynecologist Faculty Practice, [facility] 

## 2012-06-07 NOTE — Anesthesia Postprocedure Evaluation (Signed)
  Anesthesia Post-op Note  Patient: [name]  Procedure(s) Performed: * No procedures listed *  Patient Location: PACU and Mother/Baby  Anesthesia Type:Epidural  Level of Consciousness: awake, alert  and oriented  Airway and Oxygen Therapy: Patient Spontanous Breathing  Post-op Pain: mild  Post-op Assessment: Patient's Cardiovascular Status Stable, Respiratory Function Stable, No signs of Nausea or vomiting, Adequate PO intake, Pain level controlled, No headache, No backache, No residual numbness and No residual motor weakness  Post-op Vital Signs: stable  Complications: No apparent anesthesia complications 

## 2012-06-07 NOTE — Anesthesia Postprocedure Evaluation (Signed)
  Anesthesia Post-op Note  Patient: [name]  Procedure(s) Performed: * No procedures listed *  Patient Location: PACU and Mother/Baby  Anesthesia Type:Epidural  Level of Consciousness: awake, alert  and oriented  Airway and Oxygen Therapy: Patient Spontanous Breathing  Post-op Pain: mild  Post-op Assessment: Patient's Cardiovascular Status Stable, Respiratory Function Stable, No signs of Nausea or vomiting, Adequate PO intake, Pain level controlled, No headache, No backache, No residual numbness and No residual motor weakness  Post-op Vital Signs: stable  Complications: No apparent anesthesia complications

## 2012-06-08 MED ORDER — IBUPROFEN 600 MG PO TABS: 600.0000 mg | ORAL_TABLET | Freq: Four times a day (QID) | ORAL | Status: DC

## 2012-06-08 NOTE — Discharge Summary (Signed)
I have seen the patient with the resident/student and agree with the above.   

## 2012-06-08 NOTE — Discharge Summary (Signed)
Obstetric Discharge Summary Reason for Admission: onset of labor and elevated BP in pregnancy Prenatal Procedures: none Intrapartum Procedures: spontaneous vaginal delivery and reduction of nuchal cord x2 Postpartum Procedures: none Complications-Operative and Postpartum: none Hemoglobin  Date Value Range Status  06/07/2012 9.1* 12.0 - 15.0 g/dL Final     HCT  Date Value Range Status  06/07/2012 26.5* 36.0 - 46.0 % Final    Physical Exam:  General: alert, cooperative and no distress Lochia: appropriate Uterine Fundus: firm DVT Evaluation: No evidence of DVT seen on physical exam.  Discharge Diagnoses: Term Pregnancy-delivered  Discharge Information: Date: 06/08/2012 Activity: pelvic rest Diet: routine Medications: PNV and Ibuprofen Condition: stable Instructions: refer to practice specific booklet Discharge to: home Follow-up Information    Follow up with [facility]. Schedule an appointment as soon as possible for a visit in 4 weeks.   Contact information:   [address] [place] [address] [phone]         Newborn Data: Live born female  Birth Weight: 7 lb 12.7 oz (3535 g) APGAR: 6, 9  Home with mother. Bottle feeds. OP circ at [facility] next week. Considering Depo for contraception.  Street, Christopher 06/08/2012, 8:02 AM

## 2012-12-04 ENCOUNTER — Encounter (HOSPITAL_COMMUNITY): Payer: Self-pay | Admitting: Emergency Medicine

## 2012-12-04 ENCOUNTER — Emergency Department (HOSPITAL_COMMUNITY)
Admission: EM | Admit: 2012-12-04 | Discharge: 2012-12-04 | Disposition: A | Discharge status: Home / Self Care | Payer: BC Managed Care – PPO | Attending: Emergency Medicine | Admitting: Emergency Medicine

## 2012-12-04 DIAGNOSIS — Z87448 Personal history of other diseases of urinary system: Secondary | ICD-10-CM | POA: Insufficient documentation

## 2012-12-04 DIAGNOSIS — IMO0001 Reserved for inherently not codable concepts without codable children: Secondary | ICD-10-CM | POA: Insufficient documentation

## 2012-12-04 DIAGNOSIS — J029 Acute pharyngitis, unspecified: Secondary | ICD-10-CM | POA: Insufficient documentation

## 2012-12-04 DIAGNOSIS — R52 Pain, unspecified: Secondary | ICD-10-CM | POA: Insufficient documentation

## 2012-12-04 DIAGNOSIS — R1310 Dysphagia, unspecified: Secondary | ICD-10-CM | POA: Insufficient documentation

## 2012-12-04 DIAGNOSIS — Z79899 Other long term (current) drug therapy: Secondary | ICD-10-CM | POA: Insufficient documentation

## 2012-12-04 DIAGNOSIS — R599 Enlarged lymph nodes, unspecified: Secondary | ICD-10-CM | POA: Insufficient documentation

## 2012-12-04 DIAGNOSIS — R509 Fever, unspecified: Secondary | ICD-10-CM | POA: Insufficient documentation

## 2012-12-04 DIAGNOSIS — Z8679 Personal history of other diseases of the circulatory system: Secondary | ICD-10-CM | POA: Insufficient documentation

## 2012-12-04 DIAGNOSIS — Z87440 Personal history of urinary (tract) infections: Secondary | ICD-10-CM | POA: Insufficient documentation

## 2012-12-04 DIAGNOSIS — Z8719 Personal history of other diseases of the digestive system: Secondary | ICD-10-CM | POA: Insufficient documentation

## 2012-12-04 DIAGNOSIS — Z9089 Acquired absence of other organs: Secondary | ICD-10-CM | POA: Insufficient documentation

## 2012-12-04 LAB — RAPID STREP SCREEN (MED CTR MEBANE ONLY): Streptococcus, Group A Screen (Direct): NEGATIVE

## 2012-12-04 MED ORDER — MAGIC MOUTHWASH W/LIDOCAINE: 5.0000 mL | Freq: Three times a day (TID) | ORAL | Status: DC | PRN

## 2012-12-04 MED ORDER — AMOXICILLIN 500 MG PO CAPS: 500.0000 mg | ORAL_CAPSULE | Freq: Three times a day (TID) | ORAL | Status: DC

## 2012-12-04 MED ORDER — IBUPROFEN 800 MG PO TABS
800.0000 mg | ORAL_TABLET | Freq: Once | ORAL | Status: AC
  Administered 2012-12-04: 800 mg via ORAL
  Filled 2012-12-04: qty 1

## 2012-12-04 NOTE — ED Notes (Signed)
Pt c/o fever, sore throat since yesterday morning. Pt denies cough and SOB. Pt has taken ibuprofen for fever.

## 2012-12-04 NOTE — ED Provider Notes (Signed)
History     CSN: [id number]  Arrival date & time 12/04/12  4098   First MD Initiated Contact with Patient 12/04/12 0757      Chief Complaint  Patient presents with  . Fever  . Sore Throat    (Consider location/radiation/quality/duration/timing/severity/associated sxs/prior treatment) HPI Comments: [name] is a 22 y.o. female who presents to the Emergency Department complaining of sore throat that began yesterday.  C/o pain with swallowing and enlarged lymph nodes.  States she had a tonsillectomy several years ago, but still reports having occasional sore throat symptoms. She also c/o chills, and generalized body aches.  She denies abdominal pain. Vomting, dysuria, cough or earaches.     Patient is a 22 y.o. female presenting with pharyngitis. The history is provided by the patient and a parent.  Sore Throat This is a new problem. The current episode started yesterday. The problem occurs constantly. The problem has been unchanged. Associated symptoms include chills, a fever, myalgias, a sore throat and swollen glands. Pertinent negatives include no abdominal pain, anorexia, arthralgias, change in bowel habit, chest pain, congestion, coughing, headaches, joint swelling, nausea, neck pain, numbness, rash, urinary symptoms, vertigo, visual change, vomiting or weakness. The symptoms are aggravated by swallowing. She has tried NSAIDs for the symptoms. The treatment provided mild relief.    Past Medical History  Diagnosis Date  . No pertinent past medical history   . Urinary tract infection   . PONV (postoperative nausea and vomiting)   . Pyelonephritis   . Gestational hypertension, antepartum 06/06/2012    Past Surgical History  Procedure Laterality Date  . Tonsillectomy    . Gallbladder surgery    . Bladder stem stretcher      Family History  Problem Relation Age of Onset  . Other Neg Hx     History  Substance Use Topics  . Smoking status: Never Smoker   .  Smokeless tobacco: Not on file  . Alcohol Use: No    OB History   Grav Para Term Preterm Abortions TAB SAB Ect Mult Living   1 1 1  0 0 0 0 0 0 1      Review of Systems  Constitutional: Positive for fever and chills. Negative for activity change and appetite change.  HENT: Positive for sore throat. Negative for ear pain, congestion, facial swelling, rhinorrhea, trouble swallowing, neck pain, neck stiffness and voice change.   Eyes: Negative for pain and visual disturbance.  Respiratory: Negative for cough and shortness of breath.   Cardiovascular: Negative for chest pain.  Gastrointestinal: Negative for nausea, vomiting, abdominal pain, anorexia and change in bowel habit.  Genitourinary: Negative for dysuria and flank pain.  Musculoskeletal: Positive for myalgias. Negative for joint swelling and arthralgias.  Skin: Negative for color change and rash.  Neurological: Negative for dizziness, vertigo, facial asymmetry, speech difficulty, weakness, numbness and headaches.  Hematological: Negative for adenopathy.  All other systems reviewed and are negative.    Allergies  Review of patient's allergies indicates no known allergies.  Home Medications   Current Outpatient Rx  Name  Route  Sig  Dispense  Refill  . acetaminophen (TYLENOL) 325 MG tablet   Oral   Take 650 mg by mouth every 6 (six) hours as needed. headache         . flintstones complete (FLINTSTONES) 60 MG chewable tablet   Oral   Chew 2 tablets by mouth every morning.         Marland Kitchen ibuprofen (ADVIL,MOTRIN)  600 MG tablet   Oral   Take 1 tablet (600 mg total) by mouth every 6 (six) hours.   30 tablet   2     BP 102/66  Pulse 110  Temp(Src) 100 F (37.8 C) (Oral)  SpO2 98%  Physical Exam  Nursing note and vitals reviewed. Constitutional: She is oriented to person, place, and time. She appears well-developed and well-nourished. No distress.  HENT:  Head: Normocephalic and atraumatic. No trismus in the jaw.   Right Ear: Tympanic membrane and ear canal normal.  Left Ear: Tympanic membrane and ear canal normal.  Mouth/Throat: Uvula is midline and mucous membranes are normal. No edematous. Posterior oropharyngeal erythema present. No oropharyngeal exudate, posterior oropharyngeal edema or tonsillar abscesses.  Neck: Normal range of motion and phonation normal. Neck supple. No spinous process tenderness and no muscular tenderness present. No rigidity. No erythema and normal range of motion present. No Brudzinski's sign and no Kernig's sign noted. No thyromegaly present.  Cardiovascular: Normal rate, regular rhythm, normal heart sounds and intact distal pulses.   No murmur heard. Pulmonary/Chest: Effort normal and breath sounds normal. No respiratory distress.  Abdominal: Soft. She exhibits no distension. There is no tenderness. There is no rebound and no guarding.  Musculoskeletal: Normal range of motion.  Lymphadenopathy:       Head (right side): No submental and no submandibular adenopathy present.       Head (left side): No submental and no submandibular adenopathy present.    She has cervical adenopathy.       Right cervical: Superficial cervical adenopathy present. No posterior cervical adenopathy present.      Left cervical: Superficial cervical adenopathy present. No posterior cervical adenopathy present.  Neurological: She is alert and oriented to person, place, and time. She exhibits normal muscle tone. Coordination normal.  Skin: Skin is warm and dry.    ED Course  Procedures (including critical care time)  Results for orders placed during the hospital encounter of 12/04/12  RAPID STREP SCREEN      Result Value Range   Streptococcus, Group A Screen (Direct) NEGATIVE  NEGATIVE       Group A strep culture pending  MDM    Lab results reviewed, VSS.  Pt is non-toxic appearing.  Mucous membranes are moist.    Pt agrees to fluids, rest, ibuprofen and . Likely viral illness, although  possibility of mono was discussed and pt agrees to recheck with her PMD if no improvement      Zonnique Norkus L. Trisha Mangle, PA-C 12/06/12 2150

## 2012-12-06 LAB — CULTURE, GROUP A STREP

## 2012-12-07 NOTE — ED Provider Notes (Signed)
Medical screening examination/treatment/procedure(s) were performed by non-physician practitioner and as supervising physician I was immediately available for consultation/collaboration.   Marleah Beever L Cadynce Garrette, MD 12/07/12 1437 

## 2013-03-05 ENCOUNTER — Encounter: Payer: Self-pay | Admitting: Obstetrics & Gynecology

## 2013-03-05 ENCOUNTER — Ambulatory Visit (INDEPENDENT_AMBULATORY_CARE_PROVIDER_SITE_OTHER): Payer: BC Managed Care – PPO | Admitting: Obstetrics & Gynecology

## 2013-03-05 VITALS — BP 100/60 | Ht 66.0 in | Wt 136.0 lb

## 2013-03-05 DIAGNOSIS — N949 Unspecified condition associated with female genital organs and menstrual cycle: Secondary | ICD-10-CM

## 2013-03-05 DIAGNOSIS — N938 Other specified abnormal uterine and vaginal bleeding: Secondary | ICD-10-CM

## 2013-03-05 DIAGNOSIS — N920 Excessive and frequent menstruation with regular cycle: Secondary | ICD-10-CM | POA: Insufficient documentation

## 2013-03-05 MED ORDER — MEGESTROL ACETATE 40 MG PO TABS: ORAL_TABLET | ORAL | Status: DC

## 2013-03-05 NOTE — Progress Notes (Signed)
Patient ID: [name], female   DOB: 1990-10-20, 22 y.o.   MRN: [id number] Danika is in because she is been having irregular unpredictable heavy light bleeding for about the past month she She is been on the pill since last November without any problems Has not been on any antibiotics in the last month She has only missed 1 or 2 pills but certainly wouldn't cause this  I then placed around megestrol 40 mg 3 for 5 to for 5 and 1 a day to finish out approximately 3 weeks and she can restart a new pack appeals thereafter a week after she finishes Megace  If this doesn't work will try something else

## 2013-03-05 NOTE — Patient Instructions (Signed)

## 2013-03-05 NOTE — Addendum Note (Signed)
Addended by: Richardson Chiquito on: 03/05/2013 05:30 PM   Modules accepted: Orders

## 2013-08-26 ENCOUNTER — Other Ambulatory Visit: Payer: Self-pay | Admitting: Obstetrics & Gynecology

## 2013-11-09 ENCOUNTER — Encounter: Payer: Self-pay | Admitting: Obstetrics & Gynecology

## 2013-11-09 ENCOUNTER — Ambulatory Visit (INDEPENDENT_AMBULATORY_CARE_PROVIDER_SITE_OTHER): Payer: 59 | Admitting: Obstetrics & Gynecology

## 2013-11-09 ENCOUNTER — Other Ambulatory Visit (HOSPITAL_COMMUNITY)
Admission: RE | Admit: 2013-11-09 | Discharge: 2013-11-09 | Disposition: A | Discharge status: Home / Self Care | Payer: 59 | Source: Ambulatory Visit | Attending: Obstetrics & Gynecology | Admitting: Obstetrics & Gynecology

## 2013-11-09 VITALS — BP 104/66 | Ht 66.0 in | Wt 133.0 lb

## 2013-11-09 DIAGNOSIS — Z113 Encounter for screening for infections with a predominantly sexual mode of transmission: Secondary | ICD-10-CM | POA: Diagnosis present

## 2013-11-09 DIAGNOSIS — Z01419 Encounter for gynecological examination (general) (routine) without abnormal findings: Secondary | ICD-10-CM | POA: Insufficient documentation

## 2013-11-09 DIAGNOSIS — Z7251 High risk heterosexual behavior: Secondary | ICD-10-CM

## 2013-11-09 MED ORDER — NORGESTIM-ETH ESTRAD TRIPHASIC 0.18/0.215/0.25 MG-35 MCG PO TABS: ORAL_TABLET | ORAL | Status: DC

## 2013-11-09 NOTE — Progress Notes (Signed)
Patient ID: [name], female   DOB: [birth year], 23 y.o.   MRN: [id number] Subjective:     [name] is a 23 y.o. female here for a routine exam.  Patient's last menstrual period was 11/05/2013. G1P1001 Birth Control Method:  OCP Menstrual Calendar(currently): regular  Current complaints: enlarged lymph node in left clavicular chain.   Current acute medical issues:  none   Recent Gynecologic History Patient's last menstrual period was 11/05/2013. Last Pap: 2013,  normal Last mammogram: ,    Past Medical History  Diagnosis Date  . No pertinent past medical history   . Urinary tract infection   . PONV (postoperative nausea and vomiting)   . Pyelonephritis   . Gestational hypertension, antepartum 06/06/2012    Past Surgical History  Procedure Laterality Date  . Tonsillectomy    . Gallbladder surgery    . Bladder stem stretcher      OB History   Grav Para Term Preterm Abortions TAB SAB Ect Mult Living   1 1 1  0 0 0 0 0 0 1      History   Social History  . Marital Status: Single    Spouse Name: N/A    Number of Children: N/A  . Years of Education: N/A   Social History Main Topics  . Smoking status: Never Smoker   . Smokeless tobacco: None  . Alcohol Use: No  . Drug Use: No  . Sexual Activity: Yes   Other Topics Concern  . None   Social History Narrative  . None    Family History  Problem Relation Age of Onset  . Other Neg Hx   . Breast cancer Mother      Review of Systems  Review of Systems  Constitutional: Negative for fever, chills, weight loss, malaise/fatigue and diaphoresis.  HENT: Negative for hearing loss, ear pain, nosebleeds, congestion, sore throat, neck pain, tinnitus and ear discharge.   Eyes: Negative for blurred vision, double vision, photophobia, pain, discharge and redness.  Respiratory: Negative for cough, hemoptysis, sputum production, shortness of breath, wheezing and stridor.   Cardiovascular: Negative for chest  pain, palpitations, orthopnea, claudication, leg swelling and PND.  Gastrointestinal: negative for abdominal pain. Negative for heartburn, nausea, vomiting, diarrhea, constipation, blood in stool and melena.  Genitourinary: Negative for dysuria, urgency, frequency, hematuria and flank pain.  Musculoskeletal: Negative for myalgias, back pain, joint pain and falls.  Skin: Negative for itching and rash.  Neurological: Negative for dizziness, tingling, tremors, sensory change, speech change, focal weakness, seizures, loss of consciousness, weakness and headaches.  Endo/Heme/Allergies: Negative for environmental allergies and polydipsia. Does not bruise/bleed easily.  Psychiatric/Behavioral: Negative for depression, suicidal ideas, hallucinations, memory loss and substance abuse. The patient is not nervous/anxious and does not have insomnia.        Objective:    Physical Exam  Vitals reviewed. Constitutional: She is oriented to person, place, and time. She appears well-developed and well-nourished.  HENT:  Head: Normocephalic and atraumatic.        Right Ear: External ear normal.  Left Ear: External ear normal.  Nose: Nose normal.  Mouth/Throat: Oropharynx is clear and moist.  Eyes: Conjunctivae and EOM are normal. Pupils are equal, round, and reactive to light. Right eye exhibits no discharge. Left eye exhibits no discharge. No scleral icterus.  Neck: Normal range of motion. Neck supple. No tracheal deviation present. No thyromegaly present. isolated Enlarged left clavicualr node Cardiovascular: Normal rate, regular rhythm, normal heart sounds and intact  distal pulses.  Exam reveals no gallop and no friction rub.   No murmur heard. Respiratory: Effort normal and breath sounds normal. No respiratory distress. She has no wheezes. She has no rales. She exhibits no tenderness.  GI: Soft. Bowel sounds are normal. She exhibits no distension and no mass. There is no tenderness. There is no rebound  and no guarding.  Genitourinary:  Breasts no masses skin changes or nipple changes bilaterally      Vulva is normal without lesions Vagina is pink moist without discharge Cervix normal in appearance and pap is done Uterus is normal size shape and contour Adnexa is negative with normal sized ovaries   Musculoskeletal: Normal range of motion. She exhibits no edema and no tenderness.  Neurological: She is alert and oriented to person, place, and time. She has normal reflexes. She displays normal reflexes. No cranial nerve deficit. She exhibits normal muscle tone. Coordination normal.  Skin: Skin is warm and dry. No rash noted. No erythema. No pallor.  Psychiatric: She has a normal mood and affect. Her behavior is normal. Judgment and thought content normal.       Assessment:    Healthy female exam.    Plan:    Contraception: OCP (estrogen/progesterone). Follow up in: 2 weeks. recheck left lymph node

## 2013-11-09 NOTE — Addendum Note (Signed)
Addended by: Criss AlvinePULLIAM, CHRYSTAL G on: 11/09/2013 11:07 AM   Modules accepted: Orders

## 2013-11-10 LAB — RPR

## 2013-11-10 LAB — HEPATITIS C ANTIBODY: HCV Ab: NEGATIVE

## 2013-11-10 LAB — HIV ANTIBODY (ROUTINE TESTING W REFLEX): HIV: NONREACTIVE

## 2013-11-12 ENCOUNTER — Telehealth: Payer: Self-pay | Admitting: *Deleted

## 2013-11-12 NOTE — Telephone Encounter (Signed)
Pt informed of normal results from labs on 11/09/2013 except pap and HSV 2 pending.

## 2013-11-13 LAB — HSV 2 ANTIBODY, IGG: HSV 2 Glycoprotein G Ab, IgG: <0.1 IV

## 2013-11-23 ENCOUNTER — Encounter: Payer: Self-pay | Admitting: Obstetrics & Gynecology

## 2013-11-23 ENCOUNTER — Ambulatory Visit (INDEPENDENT_AMBULATORY_CARE_PROVIDER_SITE_OTHER): Payer: 59 | Admitting: Obstetrics & Gynecology

## 2013-11-23 VITALS — BP 106/70 | Wt 134.0 lb

## 2013-11-23 DIAGNOSIS — R599 Enlarged lymph nodes, unspecified: Secondary | ICD-10-CM

## 2013-11-23 DIAGNOSIS — R590 Localized enlarged lymph nodes: Secondary | ICD-10-CM

## 2013-11-23 NOTE — Progress Notes (Signed)
Patient ID: Judith BoastJennifer B Potter, female   DOB: Apr 03, 1991, 23 y.o.   MRN: [id number] Chief Complaint  Patient presents with  . Follow-up    RECHECK LT NECK .   Blood pressure 106/70, weight 134 lb (60.782 kg), last menstrual period 11/05/2013.   Pt seen in follow up Seen 2 weeks ago and had an isolated dramatically enlarged left cervical lymph node Seen for follow up  No fevers Did have a sore throat now resolved No pain Pt feels it is now better   Review of Systems  Constitutional: Negative for fever, chills, weight loss, malaise/fatigue and diaphoresis.  HENT: Negative for hearing loss, ear pain, nosebleeds, congestion, sore throat, neck pain, tinnitus and ear discharge.   Eyes: Negative for blurred vision, double vision, photophobia, pain, discharge and redness.  Respiratory: Negative for cough, hemoptysis, sputum production, shortness of breath, wheezing and stridor.   Cardiovascular: Negative for chest pain, palpitations, orthopnea, claudication, leg swelling and PND.  Gastrointestinal: Positive for abdominal pain. Negative for heartburn, nausea, vomiting, diarrhea, constipation, blood in stool and melena.  Genitourinary: Negative for dysuria, urgency, frequency, hematuria and flank pain.  Musculoskeletal: Negative for myalgias, back pain, joint pain and falls.  Skin: Negative for itching and rash.  Neurological: Negative for dizziness, tingling, tremors, sensory change, speech change, focal weakness, seizures, loss of consciousness, weakness and headaches.  Endo/Heme/Allergies: Negative for environmental allergies and polydipsia. Does not bruise/bleed easily.  Psychiatric/Behavioral: Negative for depression, suicidal ideas, hallucinations, memory loss and substance abuse. The patient is not nervous/anxious and does not have insomnia.    Exam Much smaller lymph node now no tender no other enlarged nodes  No further follow up needed for this matter

## 2014-02-03 IMAGING — US US RENAL
1 series · 14 of 25 positions shown · non-contrast
Comparison: CT abdomen and pelvis 05/22/2007

CLINICAL DATA: Pyelonephritis. Back pain and hematuria.  Fever.  24
weeks pregnant.

RENAL/URINARY TRACT ULTRASOUND COMPLETE

[Series 1: us renal · 14 of 32 slices shown]
[im 1/32]
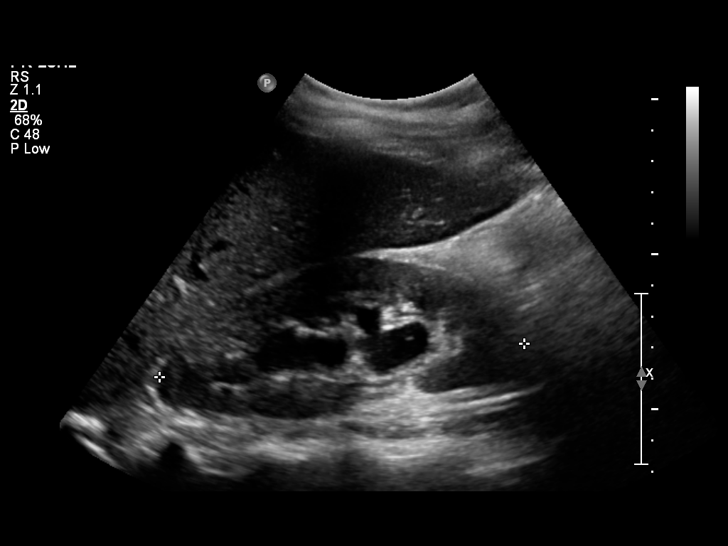
[im 3/32]
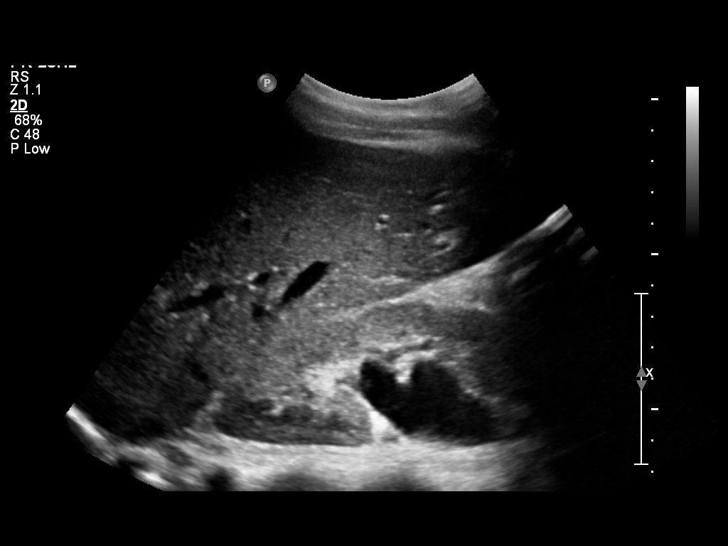
[im 6/32]
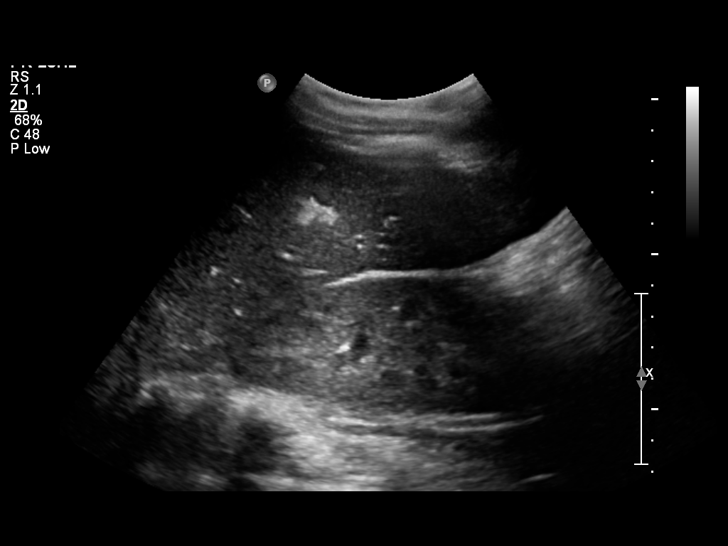
[im 8/32]
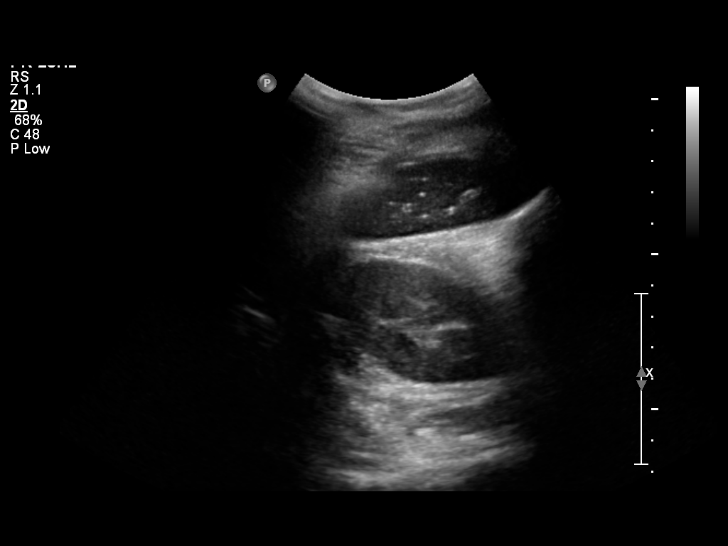
[im 11/32]
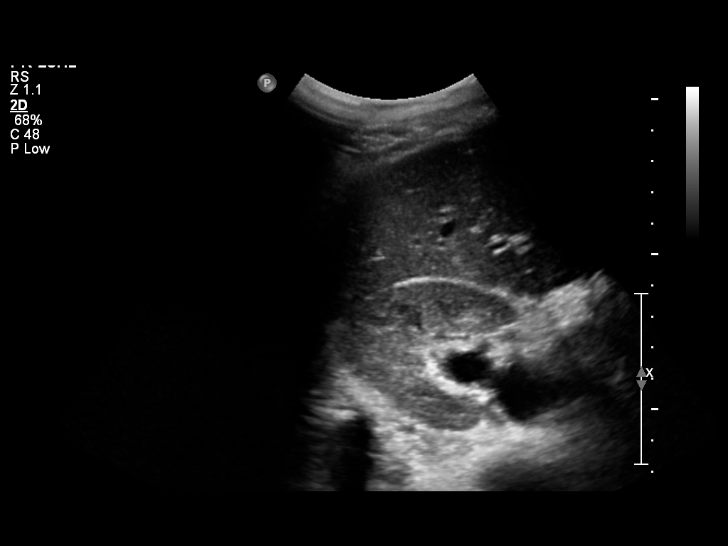
[im 12/32]
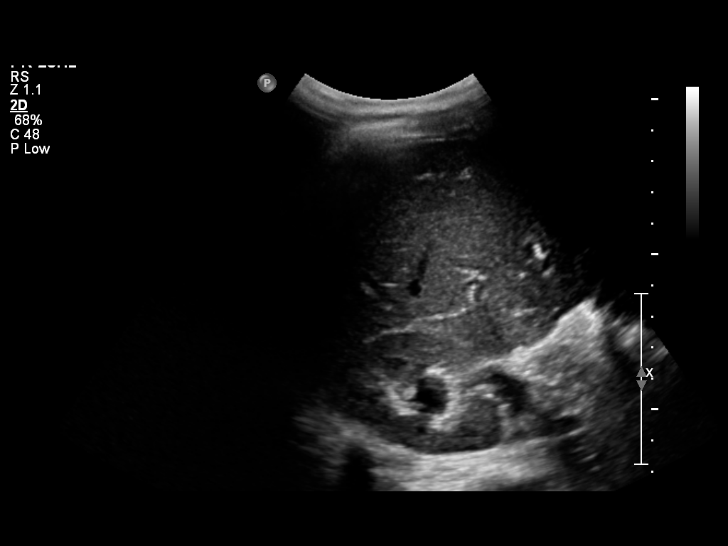
[im 15/32]
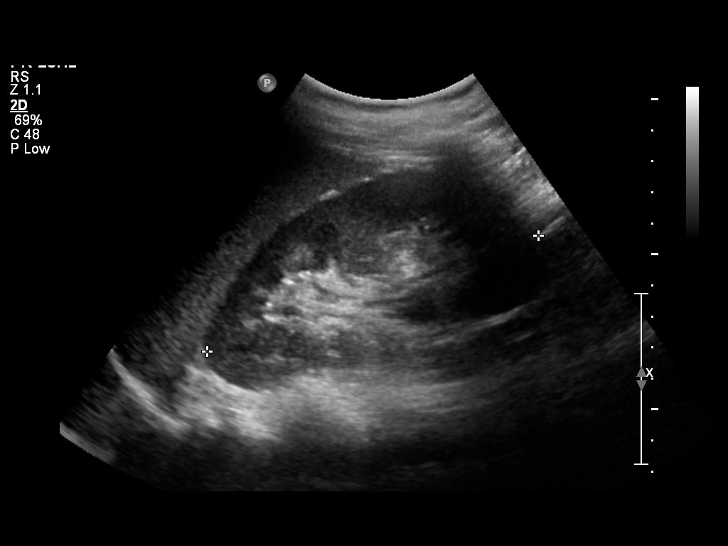
[im 17/32]
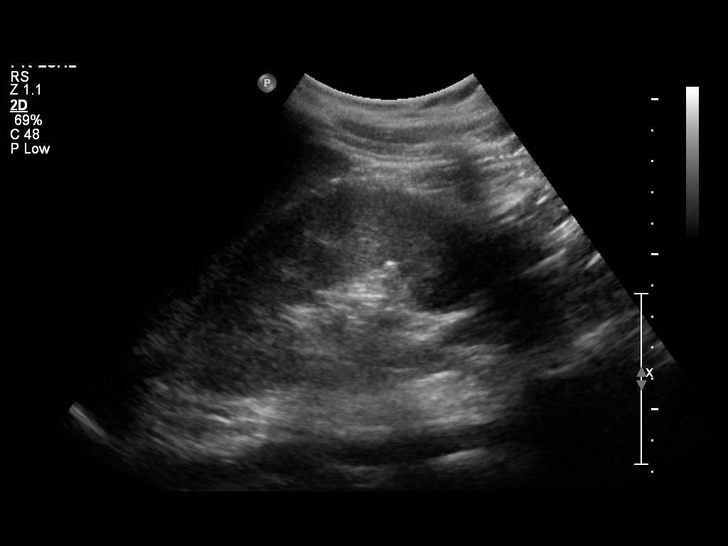
[im 20/32]
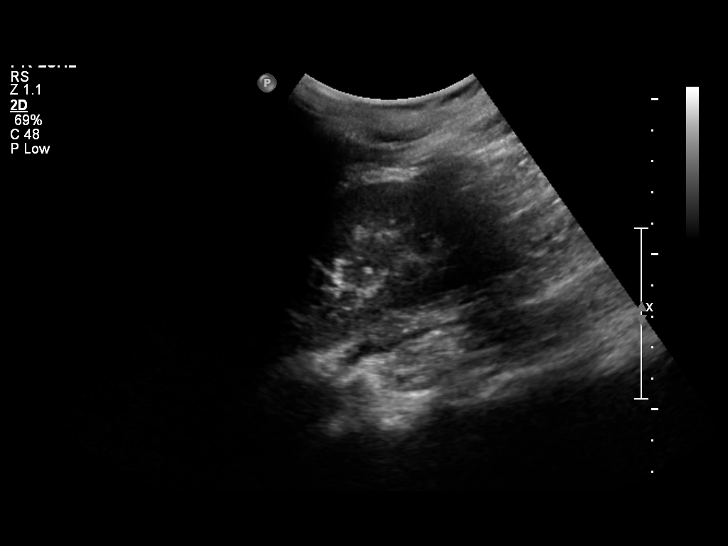
[im 21/32]
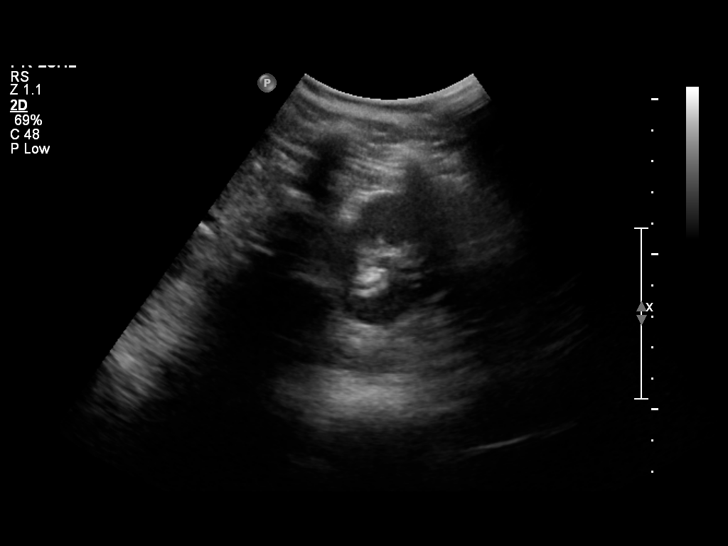
[im 24/32]
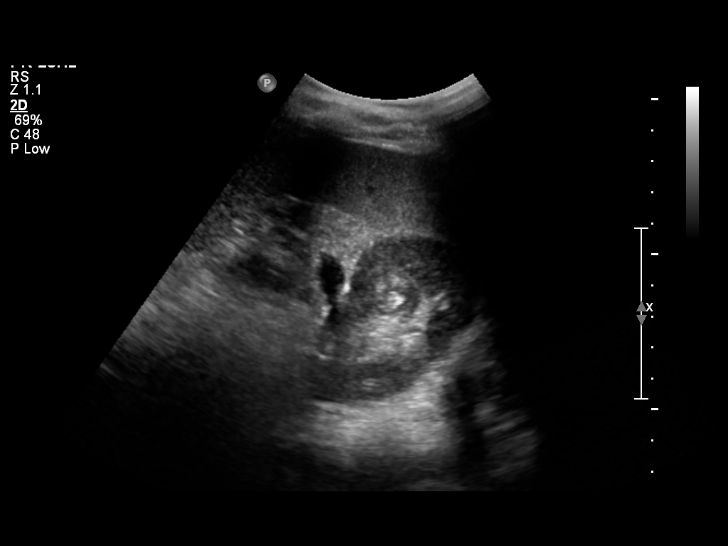
[im 26/32]
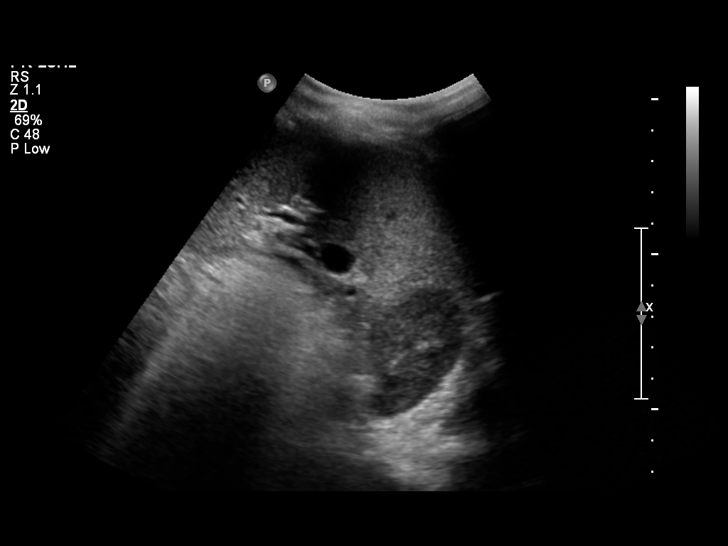
[im 29/32]
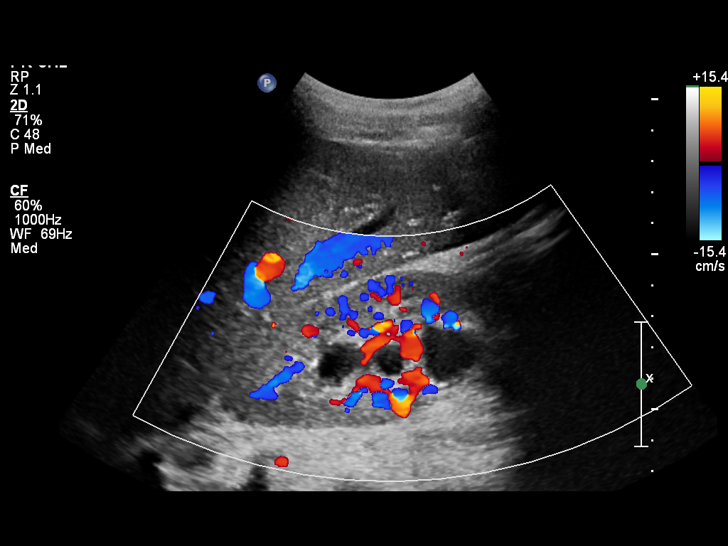
[im 32/32]
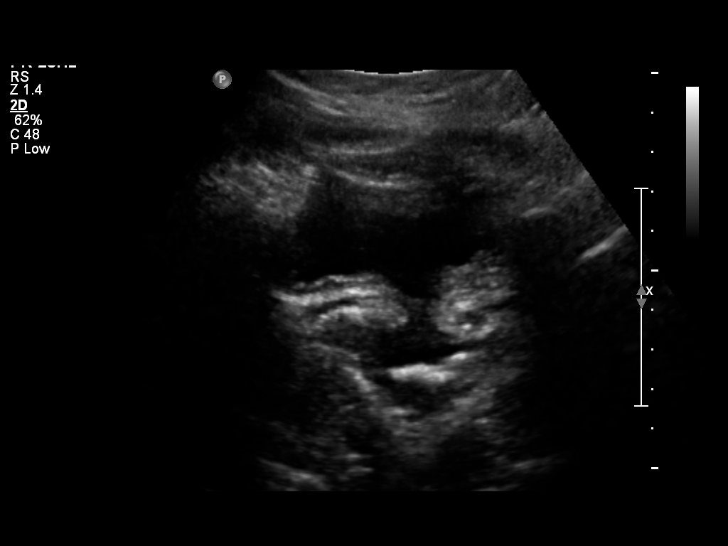

[14 of 25 positions shown; findings below may reference images not displayed]

FINDINGS: Right Kidney:  The right kidney measures 11.8 cm length. Mild
hydronephrosis.  Parenchymal echotexture and thickness are
homogeneous and normal.  No focal mass lesions demonstrated. Flow
is demonstrated in the renal sinus on color flow Doppler imaging.
No pararenal fluid collections.

Left Kidney:  Left kidney measures 11.3 cm length.  No
hydronephrosis.  Normal parenchymal echotexture and thickness.  No
focal mass lesions.  The flow is demonstrated in the renal sinus on
color flow Doppler imaging.

Bladder:  The bladder is mostly decompressed.  Incidental note of
an intrauterine pregnancy with cephalic presentation.
IMPRESSION: Mild right renal hydronephrosis.  Normal appearance of the left
kidney.

## 2014-05-13 ENCOUNTER — Encounter: Payer: Self-pay | Admitting: Obstetrics & Gynecology

## 2014-11-25 ENCOUNTER — Other Ambulatory Visit: Payer: Self-pay | Admitting: Obstetrics & Gynecology

## 2014-12-02 ENCOUNTER — Other Ambulatory Visit (HOSPITAL_COMMUNITY)
Admission: RE | Admit: 2014-12-02 | Discharge: 2014-12-02 | Disposition: A | Discharge status: Home / Self Care | Payer: 59 | Source: Ambulatory Visit | Attending: Obstetrics & Gynecology | Admitting: Obstetrics & Gynecology

## 2014-12-02 ENCOUNTER — Ambulatory Visit (INDEPENDENT_AMBULATORY_CARE_PROVIDER_SITE_OTHER): Payer: 59 | Admitting: Obstetrics & Gynecology

## 2014-12-02 ENCOUNTER — Encounter: Payer: Self-pay | Admitting: Obstetrics & Gynecology

## 2014-12-02 VITALS — BP 110/80 | HR 84 | Ht 67.0 in | Wt 135.0 lb

## 2014-12-02 DIAGNOSIS — Z7251 High risk heterosexual behavior: Secondary | ICD-10-CM

## 2014-12-02 DIAGNOSIS — Z113 Encounter for screening for infections with a predominantly sexual mode of transmission: Secondary | ICD-10-CM | POA: Diagnosis present

## 2014-12-02 DIAGNOSIS — Z01419 Encounter for gynecological examination (general) (routine) without abnormal findings: Secondary | ICD-10-CM | POA: Diagnosis not present

## 2014-12-02 DIAGNOSIS — Z01411 Encounter for gynecological examination (general) (routine) with abnormal findings: Secondary | ICD-10-CM | POA: Insufficient documentation

## 2014-12-02 MED ORDER — NYSTATIN-TRIAMCINOLONE 100000-0.1 UNIT/GM-% EX OINT: 1.0000 "application " | TOPICAL_OINTMENT | Freq: Two times a day (BID) | CUTANEOUS | Status: DC

## 2014-12-02 MED ORDER — METRONIDAZOLE 0.75 % VA GEL: VAGINAL | Status: DC

## 2014-12-02 NOTE — Progress Notes (Signed)
Patient ID: [name], female   DOB: May 29, 1991, 24 y.o.   MRN: [id number] Subjective:     [name] is a 24 y.o. female here for a routine exam.  Patient's last menstrual period was 11/27/2014. G1P1001 Birth Control Method:  Tri sprintec Menstrual Calendar(currently): regular mostly  Current complaints: concerned about std exposure.   Current acute medical issues:  none   Recent Gynecologic History Patient's last menstrual period was 11/27/2014. Last Pap: 2015,  normal Last mammogram: ,    Past Medical History  Diagnosis Date  . No pertinent past medical history   . Urinary tract infection   . PONV (postoperative nausea and vomiting)   . Pyelonephritis   . Gestational hypertension, antepartum 06/06/2012    Past Surgical History  Procedure Laterality Date  . Tonsillectomy    . Gallbladder surgery    . Bladder stem stretcher      OB History    Gravida Para Term Preterm AB TAB SAB Ectopic Multiple Living   1 1 1  0 0 0 0 0 0 1      History   Social History  . Marital Status: Single    Spouse Name: N/A  . Number of Children: N/A  . Years of Education: N/A   Social History Main Topics  . Smoking status: Never Smoker   . Smokeless tobacco: Not on file  . Alcohol Use: No  . Drug Use: No  . Sexual Activity: Yes   Other Topics Concern  . None   Social History Narrative    Family History  Problem Relation Age of Onset  . Other Neg Hx   . Breast cancer Mother      Current outpatient prescriptions:  .  ibuprofen (ADVIL,MOTRIN) 600 MG tablet, Take 1 tablet (600 mg total) by mouth every 6 (six) hours., Disp: 30 tablet, Rfl: 2 .  TRI-SPRINTEC 0.18/0.215/0.25 MG-35 MCG tablet, TAKE 1 TABLET BY MOUTH AT THE SAME TIME DAILY., Disp: 28 tablet, Rfl: 11 .  acetaminophen (TYLENOL) 325 MG tablet, Take 650 mg by mouth every 6 (six) hours as needed. headache, Disp: , Rfl:  .  Alum & Mag Hydroxide-Simeth (MAGIC MOUTHWASH W/LIDOCAINE) SOLN, Take 5 mLs by mouth  3 (three) times daily as needed. Swish and spit, do not swallow (Patient not taking: Reported on 12/02/2014), Disp: 60 mL, Rfl: 0 .  flintstones complete (FLINTSTONES) 60 MG chewable tablet, Chew 2 tablets by mouth every morning., Disp: , Rfl:  .  megestrol (MEGACE) 40 MG tablet, Take 3 a day for 5 days, 2 a day for 5 days, then 1 a day (Patient not taking: Reported on 12/02/2014), Disp: 36 tablet, Rfl: 1 .  norgestimate-ethinyl estradiol (ORTHO-CYCLEN,SPRINTEC,PREVIFEM) 0.25-35 MG-MCG tablet, Take 1 tablet by mouth daily., Disp: , Rfl:   Review of Systems  Review of Systems  Constitutional: Negative for fever, chills, weight loss, malaise/fatigue and diaphoresis.  HENT: Negative for hearing loss, ear pain, nosebleeds, congestion, sore throat, neck pain, tinnitus and ear discharge.   Eyes: Negative for blurred vision, double vision, photophobia, pain, discharge and redness.  Respiratory: Negative for cough, hemoptysis, sputum production, shortness of breath, wheezing and stridor.   Cardiovascular: Negative for chest pain, palpitations, orthopnea, claudication, leg swelling and PND.  Gastrointestinal: negative for abdominal pain. Negative for heartburn, nausea, vomiting, diarrhea, constipation, blood in stool and melena.  Genitourinary: Negative for dysuria, urgency, frequency, hematuria and flank pain.  Musculoskeletal: Negative for myalgias, back pain, joint pain and falls.  Skin: Negative  for itching and rash.  Neurological: Negative for dizziness, tingling, tremors, sensory change, speech change, focal weakness, seizures, loss of consciousness, weakness and headaches.  Endo/Heme/Allergies: Negative for environmental allergies and polydipsia. Does not bruise/bleed easily.  Psychiatric/Behavioral: Negative for depression, suicidal ideas, hallucinations, memory loss and substance abuse. The patient is not nervous/anxious and does not have insomnia.        Objective:  Blood pressure 110/80,  pulse 84, height  (1.702 m), weight 135 lb (61.236 kg), last menstrual period 11/27/2014.   Physical Exam  Vitals reviewed. Constitutional: She is oriented to person, place, and time. She appears well-developed and well-nourished.  HENT:  Head: Normocephalic and atraumatic.        Right Ear: External ear normal.  Left Ear: External ear normal.  Nose: Nose normal.  Mouth/Throat: Oropharynx is clear and moist.  Eyes: Conjunctivae and EOM are normal. Pupils are equal, round, and reactive to light. Right eye exhibits no discharge. Left eye exhibits no discharge. No scleral icterus.  Neck: Normal range of motion. Neck supple. No tracheal deviation present. No thyromegaly present.  Cardiovascular: Normal rate, regular rhythm, normal heart sounds and intact distal pulses.  Exam reveals no gallop and no friction rub.   No murmur heard. Respiratory: Effort normal and breath sounds normal. No respiratory distress. She has no wheezes. She has no rales. She exhibits no tenderness.  GI: Soft. Bowel sounds are normal. She exhibits no distension and no mass. There is no tenderness. There is no rebound and no guarding.  Genitourinary:  Breasts no masses skin changes or nipple changes bilaterally      Vulva is normal without lesions Vagina is pink moist without discharge Cervix normal in appearance and pap is done Uterus is normal size shape and contour Adnexa is negative with normal sized ovaries   Musculoskeletal: Normal range of motion. She exhibits no edema and no tenderness.  Neurological: She is alert and oriented to person, place, and time. She has normal reflexes. She displays normal reflexes. No cranial nerve deficit. She exhibits normal muscle tone. Coordination normal.  Skin: Skin is warm and dry. No rash noted. No erythema. No pallor.  Psychiatric: She has a normal mood and affect. Her behavior is normal. Judgment and thought content normal.       Assessment:    Healthy female exam.     Plan:    Contraception: OCP (estrogen/progesterone). Follow up in: 1 year. HIV RPR HCV GC Chlamydia    metrogel mytrex cream

## 2014-12-02 NOTE — Addendum Note (Signed)
Addended by: Criss AlvinePULLIAM, CHRYSTAL G on: 12/02/2014 04:48 PM   Modules accepted: Orders

## 2014-12-03 LAB — HIV ANTIBODY (ROUTINE TESTING W REFLEX): HIV SCREEN 4TH GENERATION: NONREACTIVE

## 2014-12-03 LAB — HEPATITIS C ANTIBODY

## 2014-12-03 LAB — RPR: RPR Ser Ql: NONREACTIVE

## 2014-12-05 LAB — CYTOLOGY - PAP

## 2015-08-29 DIAGNOSIS — J069 Acute upper respiratory infection, unspecified: Secondary | ICD-10-CM | POA: Diagnosis not present

## 2015-08-29 DIAGNOSIS — Z1389 Encounter for screening for other disorder: Secondary | ICD-10-CM | POA: Diagnosis not present

## 2015-08-29 DIAGNOSIS — Z6822 Body mass index (BMI) 22.0-22.9, adult: Secondary | ICD-10-CM | POA: Diagnosis not present

## 2015-12-12 DIAGNOSIS — Z1389 Encounter for screening for other disorder: Secondary | ICD-10-CM | POA: Diagnosis not present

## 2015-12-12 DIAGNOSIS — Z111 Encounter for screening for respiratory tuberculosis: Secondary | ICD-10-CM | POA: Diagnosis not present

## 2015-12-12 DIAGNOSIS — R81 Glycosuria: Secondary | ICD-10-CM | POA: Diagnosis not present

## 2015-12-12 DIAGNOSIS — Z0000 Encounter for general adult medical examination without abnormal findings: Secondary | ICD-10-CM | POA: Diagnosis not present

## 2015-12-15 DIAGNOSIS — Z111 Encounter for screening for respiratory tuberculosis: Secondary | ICD-10-CM | POA: Diagnosis not present

## 2015-12-18 DIAGNOSIS — Z6822 Body mass index (BMI) 22.0-22.9, adult: Secondary | ICD-10-CM | POA: Diagnosis not present

## 2015-12-18 DIAGNOSIS — Z1389 Encounter for screening for other disorder: Secondary | ICD-10-CM | POA: Diagnosis not present

## 2015-12-18 DIAGNOSIS — Z0000 Encounter for general adult medical examination without abnormal findings: Secondary | ICD-10-CM | POA: Diagnosis not present

## 2015-12-22 ENCOUNTER — Other Ambulatory Visit: Payer: Self-pay | Admitting: Obstetrics & Gynecology

## 2015-12-22 DIAGNOSIS — Z111 Encounter for screening for respiratory tuberculosis: Secondary | ICD-10-CM | POA: Diagnosis not present

## 2016-02-27 ENCOUNTER — Ambulatory Visit (INDEPENDENT_AMBULATORY_CARE_PROVIDER_SITE_OTHER): Payer: 59 | Admitting: Adult Health

## 2016-02-27 ENCOUNTER — Encounter: Payer: Self-pay | Admitting: Adult Health

## 2016-02-27 VITALS — BP 102/60 | HR 78 | Ht 66.0 in | Wt 140.5 lb

## 2016-02-27 DIAGNOSIS — Z3201 Encounter for pregnancy test, result positive: Secondary | ICD-10-CM

## 2016-02-27 DIAGNOSIS — Z3490 Encounter for supervision of normal pregnancy, unspecified, unspecified trimester: Secondary | ICD-10-CM

## 2016-02-27 DIAGNOSIS — O3680X0 Pregnancy with inconclusive fetal viability, not applicable or unspecified: Secondary | ICD-10-CM

## 2016-02-27 LAB — POCT URINE PREGNANCY: PREG TEST UR: POSITIVE — AB

## 2016-02-27 NOTE — Progress Notes (Signed)
Subjective:     Patient ID: Judith BoastJennifer B Potter, female   DOB: 1990-12-04, 25 y.o.   MRN: [id number]  HPI [name] is a 25 year old white female in for a UPT, has had 2 +HPT.She works in [employer] at [employer].   Review of Systems Patient denies any headaches, hearing loss, fatigue, blurred vision, shortness of breath, chest pain, abdominal pain, problems with bowel movements, urination, or intercourse. No joint pain or mood swings.No bleeding, some nausea. Reviewed past medical,surgical, social and family history. Reviewed medications and allergies.     Objective:   Physical Exam BP 102/60 (BP Location: Left Arm, Patient Position: Sitting, Cuff Size: Normal)   Pulse 78   Ht 5\' 6"  (1.676 m)   Wt 140 lb 8 oz (63.7 kg)   LMP 01/13/2016 (Exact Date)   BMI 22.68 kg/m  UPT + about 6+3 weeks by LMP with EDD 10/19/16, Skin warm and dry. Neck: mid line trachea, normal thyroid, good ROM, no lymphadenopathy noted. Lungs: clear to ausculation bilaterally. Cardiovascular: regular rate and rhythm.Abdomen is soft and nontender.    Assessment:     Pregnancy examination or test, positive result - Plan: POCT urine pregnancy  Pregnant  Pregnancy with inconclusive fetal viability, not applicable or unspecified fetus - Plan: US OB Comp Less 14 Wks     Plan:    Eat often Continue flintstones Return in 1 week for dating US Review handout on first trimester

## 2016-02-27 NOTE — Patient Instructions (Signed)
First Trimester of Pregnancy The first trimester of pregnancy is from week 1 until the end of week 12 (months 1 through 3). A week after a sperm fertilizes an egg, the egg will implant on the wall of the uterus. This embryo will begin to develop into a baby. Genes from you and your partner are forming the baby. The female genes determine whether the baby is a boy or a girl. At 6-8 weeks, the eyes and face are formed, and the heartbeat can be seen on ultrasound. At the end of 12 weeks, all the baby's organs are formed.  Now that you are pregnant, you will want to do everything you can to have a healthy baby. Two of the most important things are to get good prenatal care and to follow your health care provider's instructions. Prenatal care is all the medical care you receive before the baby's birth. This care will help prevent, find, and treat any problems during the pregnancy and childbirth. BODY CHANGES Your body goes through many changes during pregnancy. The changes vary from woman to woman.   You may gain or lose a couple of pounds at first.  You may feel sick to your stomach (nauseous) and throw up (vomit). If the vomiting is uncontrollable, call your health care provider.  You may tire easily.  You may develop headaches that can be relieved by medicines approved by your health care provider.  You may urinate more often. Painful urination may mean you have a bladder infection.  You may develop heartburn as a result of your pregnancy.  You may develop constipation because certain hormones are causing the muscles that push waste through your intestines to slow down.  You may develop hemorrhoids or swollen, bulging veins (varicose veins).  Your breasts may begin to grow larger and become tender. Your nipples may stick out more, and the tissue that surrounds them (areola) may become darker.  Your gums may bleed and may be sensitive to brushing and flossing.  Dark spots or blotches (chloasma,  mask of pregnancy) may develop on your face. This will likely fade after the baby is born.  Your menstrual periods will stop.  You may have a loss of appetite.  You may develop cravings for certain kinds of food.  You may have changes in your emotions from day to day, such as being excited to be pregnant or being concerned that something may go wrong with the pregnancy and baby.  You may have more vivid and strange dreams.  You may have changes in your hair. These can include thickening of your hair, rapid growth, and changes in texture. Some women also have hair loss during or after pregnancy, or hair that feels dry or thin. Your hair will most likely return to normal after your baby is born. WHAT TO EXPECT AT YOUR PRENATAL VISITS During a routine prenatal visit:  You will be weighed to make sure you and the baby are growing normally.  Your blood pressure will be taken.  Your abdomen will be measured to track your baby's growth.  The fetal heartbeat will be listened to starting around week 10 or 12 of your pregnancy.  Test results from any previous visits will be discussed. Your health care provider may ask you:  How you are feeling.  If you are feeling the baby move.  If you have had any abnormal symptoms, such as leaking fluid, bleeding, severe headaches, or abdominal cramping.  If you are using any tobacco products,   including cigarettes, chewing tobacco, and electronic cigarettes.  If you have any questions. Other tests that may be performed during your first trimester include:  Blood tests to find your blood type and to check for the presence of any previous infections. They will also be used to check for low iron levels (anemia) and Rh antibodies. Later in the pregnancy, blood tests for diabetes will be done along with other tests if problems develop.  Urine tests to check for infections, diabetes, or protein in the urine.  An ultrasound to confirm the proper growth  and development of the baby.  An amniocentesis to check for possible genetic problems.  Fetal screens for spina bifida and Down syndrome.  You may need other tests to make sure you and the baby are doing well.  HIV (human immunodeficiency virus) testing. Routine prenatal testing includes screening for HIV, unless you choose not to have this test. HOME CARE INSTRUCTIONS  Medicines  Follow your health care provider's instructions regarding medicine use. Specific medicines may be either safe or unsafe to take during pregnancy.  Take your prenatal vitamins as directed.  If you develop constipation, try taking a stool softener if your health care provider approves. Diet  Eat regular, well-balanced meals. Choose a variety of foods, such as meat or vegetable-based protein, fish, milk and low-fat dairy products, vegetables, fruits, and whole grain breads and cereals. Your health care provider will help you determine the amount of weight gain that is right for you.  Avoid raw meat and uncooked cheese. These carry germs that can cause birth defects in the baby.  Eating four or five small meals rather than three large meals a day may help relieve nausea and vomiting. If you start to feel nauseous, eating a few soda crackers can be helpful. Drinking liquids between meals instead of during meals also seems to help nausea and vomiting.  If you develop constipation, eat more high-fiber foods, such as fresh vegetables or fruit and whole grains. Drink enough fluids to keep your urine clear or pale yellow. Activity and Exercise  Exercise only as directed by your health care provider. Exercising will help you:  Control your weight.  Stay in shape.  Be prepared for labor and delivery.  Experiencing pain or cramping in the lower abdomen or low back is a good sign that you should stop exercising. Check with your health care provider before continuing normal exercises.  Try to avoid standing for long  periods of time. Move your legs often if you must stand in one place for a long time.  Avoid heavy lifting.  Wear low-heeled shoes, and practice good posture.  You may continue to have sex unless your health care provider directs you otherwise. Relief of Pain or Discomfort  Wear a good support bra for breast tenderness.   Take warm sitz baths to soothe any pain or discomfort caused by hemorrhoids. Use hemorrhoid cream if your health care provider approves.   Rest with your legs elevated if you have leg cramps or low back pain.  If you develop varicose veins in your legs, wear support hose. Elevate your feet for 15 minutes, 3-4 times a day. Limit salt in your diet. Prenatal Care  Schedule your prenatal visits by the twelfth week of pregnancy. They are usually scheduled monthly at first, then more often in the last 2 months before delivery.  Write down your questions. Take them to your prenatal visits.  Keep all your prenatal visits as directed by your   health care provider. Safety  Wear your seat belt at all times when driving.  Make a list of emergency phone numbers, including numbers for family, friends, the hospital, and police and fire departments. General Tips  Ask your health care provider for a referral to a local prenatal education class. Begin classes no later than at the beginning of month 6 of your pregnancy.  Ask for help if you have counseling or nutritional needs during pregnancy. Your health care provider can offer advice or refer you to specialists for help with various needs.  Do not use hot tubs, steam rooms, or saunas.  Do not douche or use tampons or scented sanitary pads.  Do not cross your legs for long periods of time.  Avoid cat litter boxes and soil used by cats. These carry germs that can cause birth defects in the baby and possibly loss of the fetus by miscarriage or stillbirth.  Avoid all smoking, herbs, alcohol, and medicines not prescribed by  your health care provider. Chemicals in these affect the formation and growth of the baby.  Do not use any tobacco products, including cigarettes, chewing tobacco, and electronic cigarettes. If you need help quitting, ask your health care provider. You may receive counseling support and other resources to help you quit.  Schedule a dentist appointment. At home, brush your teeth with a soft toothbrush and be gentle when you floss. SEEK MEDICAL CARE IF:   You have dizziness.  You have mild pelvic cramps, pelvic pressure, or nagging pain in the abdominal area.  You have persistent nausea, vomiting, or diarrhea.  You have a bad smelling vaginal discharge.  You have pain with urination.  You notice increased swelling in your face, hands, legs, or ankles. SEEK IMMEDIATE MEDICAL CARE IF:   You have a fever.  You are leaking fluid from your vagina.  You have spotting or bleeding from your vagina.  You have severe abdominal cramping or pain.  You have rapid weight gain or loss.  You vomit blood or material that looks like coffee grounds.  You are exposed to German measles and have never had them.  You are exposed to fifth disease or chickenpox.  You develop a severe headache.  You have shortness of breath.  You have any kind of trauma, such as from a fall or a car accident.   This information is not intended to replace advice given to you by your health care provider. Make sure you discuss any questions you have with your health care provider.   Document Released: 06/22/2001 Document Revised: 07/19/2014 Document Reviewed: 05/08/2013 Elsevier Interactive Patient Education 2016 Elsevier Inc. Return in 1 week for dating US  

## 2016-03-08 ENCOUNTER — Ambulatory Visit (INDEPENDENT_AMBULATORY_CARE_PROVIDER_SITE_OTHER): Payer: 59

## 2016-03-08 DIAGNOSIS — O3680X0 Pregnancy with inconclusive fetal viability, not applicable or unspecified: Secondary | ICD-10-CM | POA: Diagnosis not present

## 2016-03-08 DIAGNOSIS — Z3A08 8 weeks gestation of pregnancy: Secondary | ICD-10-CM | POA: Diagnosis not present

## 2016-03-08 NOTE — Progress Notes (Signed)
US 7+6 wks,single IUP w/ys,pos fht 162 bpm,crl 16.6 mm

## 2016-03-10 ENCOUNTER — Other Ambulatory Visit: Payer: 59

## 2016-03-22 ENCOUNTER — Encounter: Payer: 59 | Admitting: Women's Health

## 2016-03-26 ENCOUNTER — Other Ambulatory Visit (INDEPENDENT_AMBULATORY_CARE_PROVIDER_SITE_OTHER): Payer: 59

## 2016-03-26 ENCOUNTER — Other Ambulatory Visit: Payer: Self-pay | Admitting: Obstetrics & Gynecology

## 2016-03-26 ENCOUNTER — Encounter: Payer: Self-pay | Admitting: Adult Health

## 2016-03-26 ENCOUNTER — Ambulatory Visit (INDEPENDENT_AMBULATORY_CARE_PROVIDER_SITE_OTHER): Payer: 59 | Admitting: Adult Health

## 2016-03-26 ENCOUNTER — Other Ambulatory Visit: Payer: Self-pay | Admitting: Adult Health

## 2016-03-26 VITALS — BP 90/60 | HR 80 | Wt 142.0 lb

## 2016-03-26 DIAGNOSIS — O021 Missed abortion: Secondary | ICD-10-CM | POA: Diagnosis not present

## 2016-03-26 DIAGNOSIS — Z1389 Encounter for screening for other disorder: Secondary | ICD-10-CM | POA: Diagnosis not present

## 2016-03-26 DIAGNOSIS — O3680X0 Pregnancy with inconclusive fetal viability, not applicable or unspecified: Secondary | ICD-10-CM

## 2016-03-26 DIAGNOSIS — Z3A08 8 weeks gestation of pregnancy: Secondary | ICD-10-CM | POA: Diagnosis not present

## 2016-03-26 DIAGNOSIS — Z331 Pregnant state, incidental: Secondary | ICD-10-CM

## 2016-03-26 DIAGNOSIS — Z0283 Encounter for blood-alcohol and blood-drug test: Secondary | ICD-10-CM

## 2016-03-26 DIAGNOSIS — Z369 Encounter for antenatal screening, unspecified: Secondary | ICD-10-CM

## 2016-03-26 LAB — POCT URINALYSIS DIPSTICK
Glucose, UA: NEGATIVE
Ketones, UA: NEGATIVE
Leukocytes, UA: NEGATIVE
Nitrite, UA: NEGATIVE
Protein, UA: NEGATIVE
RBC UA: NEGATIVE

## 2016-03-26 NOTE — Patient Instructions (Signed)
[name]  03/26/2016     @PREFPERIOPPHARMACY @   Your procedure is scheduled on 03/30/2016.  Report to [facility] at 6:30 A.M.  Call this number if you have problems the morning of surgery:  [phone]   Remember:  Do not eat food or drink liquids after midnight.  Take these medicines the morning of surgery with A SIP OF WATER None   Do not wear jewelry, make-up or nail polish.  Do not wear lotions, powders, or perfumes, or deoderant.  Do not shave 48 hours prior to surgery.  Men may shave face and neck.  Do not bring valuables to the hospital.  [facility] is not responsible for any belongings or valuables.  Contacts, dentures or bridgework may not be worn into surgery.  Leave your suitcase in the car.  After surgery it may be brought to your room.  For patients admitted to the hospital, discharge time will be determined by your treatment team.  Patients discharged the day of surgery will not be allowed to drive home.   Please read over the following fact sheets that you were given. Surgical Site Infection Prevention and Anesthesia Post-op Instructions     PATIENT INSTRUCTIONS POST-ANESTHESIA  IMMEDIATELY FOLLOWING SURGERY:  Do not drive or operate machinery for the first twenty four hours after surgery.  Do not make any important decisions for twenty four hours after surgery or while taking narcotic pain medications or sedatives.  If you develop intractable nausea and vomiting or a severe headache please notify your doctor immediately.  FOLLOW-UP:  Please make an appointment with your surgeon as instructed. You do not need to follow up with anesthesia unless specifically instructed to do so.  WOUND CARE INSTRUCTIONS (if applicable):  Keep a dry clean dressing on the anesthesia/puncture wound site if there is drainage.  Once the wound has quit draining you may leave it open to air.  Generally you should leave the bandage intact for twenty four hours unless there is  drainage.  If the epidural site drains for more than 36-48 hours please call the anesthesia department.  QUESTIONS?:  Please feel free to call your physician or the hospital operator if you have any questions, and they will be happy to assist you.      Dilation and Curettage or Vacuum Curettage Dilation and curettage (D&C) and vacuum curettage are minor procedures. A D&C involves stretching (dilation) the cervix and scraping (curettage) the inside lining of the womb (uterus). During a D&C, tissue is gently scraped from the inside lining of the uterus. During a vacuum curettage, the lining and tissue in the uterus are removed with the use of gentle suction.  Curettage may be performed to either diagnose or treat a problem. As a diagnostic procedure, curettage is performed to examine tissues from the uterus. A diagnostic curettage may be performed for the following symptoms:   Irregular bleeding in the uterus.   Bleeding with the development of clots.   Spotting between menstrual periods.   Prolonged menstrual periods.   Bleeding after menopause.   No menstrual period (amenorrhea).   A change in size and shape of the uterus.  As a treatment procedure, curettage may be performed for the following reasons:   Removal of an IUD (intrauterine device).   Removal of retained placenta after giving birth. Retained placenta can cause an infection or bleeding severe enough to require transfusions.   Abortion.   Miscarriage.   Removal of polyps inside the uterus.  Removal of uncommon types of noncancerous lumps (fibroids).  LET [facility] PROVIDER KNOW ABOUT:   Any allergies you have.   All medicines you are taking, including vitamins, herbs, eye drops, creams, and over-the-counter medicines.   Previous problems you or members of your family have had with the use of anesthetics.   Any blood disorders you have.   Previous surgeries you have had.   Medical  conditions you have. RISKS AND COMPLICATIONS  Generally, this is a safe procedure. However, as with any procedure, complications can occur. Possible complications include:  Excessive bleeding.   Infection of the uterus.   Damage to the cervix.   Development of scar tissue (adhesions) inside the uterus, later causing abnormal amounts of menstrual bleeding.   Complications from the general anesthetic, if a general anesthetic is used.   Putting a hole (perforation) in the uterus. This is rare.  BEFORE THE PROCEDURE   Eat and drink before the procedure only as directed by your health care provider.   Arrange for someone to take you home.  PROCEDURE  This procedure usually takes about 15-30 minutes.  You will be given one of the following:  A medicine that numbs the area in and around the cervix (local anesthetic).   A medicine to make you sleep through the procedure (general anesthetic).  You will lie on your back with your legs in stirrups.   A warm metal or plastic instrument (speculum) will be placed in your vagina to keep it open and to allow the health care provider to see the cervix.  There are two ways in which your cervix can be softened and dilated. These include:   Taking a medicine.   Having thin rods (laminaria) inserted into your cervix.   A curved tool (curette) will be used to scrape cells from the inside lining of the uterus. In some cases, gentle suction is applied with the curette. The curette will then be removed.  AFTER THE PROCEDURE   You will rest in the recovery area until you are stable and are ready to go home.   You may feel sick to your stomach (nauseous) or throw up (vomit) if you were given a general anesthetic.   You may have a sore throat if a tube was placed in your throat during general anesthesia.   You may have light cramping and bleeding. This may last for 2 days to 2 weeks after the procedure.   Your uterus needs to  make a new lining after the procedure. This may make your next period late.   This information is not intended to replace advice given to you by your health care provider. Make sure you discuss any questions you have with your health care provider.   Document Released: 06/28/2005 Document Revised: 02/28/2013 Document Reviewed: 01/25/2013 Elsevier Interactive Patient Education [employer]2016 Elsevier Inc.

## 2016-03-26 NOTE — Progress Notes (Signed)
US TV 7+6 wks,single IUP, NO FHT ,CRL 14.9 mm,GS 45.2 mm,normal ov's bilat,results discussed with [name]

## 2016-03-26 NOTE — Progress Notes (Signed)
[name] was here for new OB and US performed to check for FHM and found to have no heart beat, and fetal pole was about 1.35 cm, Dr Despina HiddenEure for co exam and agreed [facility]. Discussed that this was missed abortion or miscarriage, and options were of waiting and watching, cytotec and D&C.She wants to proceed with D&C and Dr Despina HiddenEure will schedule for next week Skin warm and dry. Neck: mid line trachea, normal thyroid, good ROM, no lymphadenopathy noted. Lungs: clear to ausculation bilaterally. Cardiovascular: regular rate and rhythm.Blood type O+ Follow up in 2 weeks with Dr Despina HiddenEure.

## 2016-03-26 NOTE — Patient Instructions (Signed)
Miscarriage  A miscarriage is the sudden loss of an unborn baby (fetus) before the 20th week of pregnancy. Most miscarriages happen in the first 3 months of pregnancy. Sometimes, it happens before a woman even knows she is pregnant. A miscarriage is also called a "spontaneous miscarriage" or "early pregnancy loss." Having a miscarriage can be an emotional experience. Talk with your caregiver about any questions you may have about miscarrying, the grieving process, and your future pregnancy plans.  CAUSES    Problems with the fetal chromosomes that make it impossible for the baby to develop normally. Problems with the baby's genes or chromosomes are most often the result of errors that occur, by chance, as the embryo divides and grows. The problems are not inherited from the parents.   Infection of the cervix or uterus.    Hormone problems.    Problems with the cervix, such as having an incompetent cervix. This is when the tissue in the cervix is not strong enough to hold the pregnancy.    Problems with the uterus, such as an abnormally shaped uterus, uterine fibroids, or congenital abnormalities.    Certain medical conditions.    Smoking, drinking alcohol, or taking illegal drugs.    Trauma.   Often, the cause of a miscarriage is unknown.   SYMPTOMS    Vaginal bleeding or spotting, with or without cramps or pain.   Pain or cramping in the abdomen or lower back.   Passing fluid, tissue, or blood clots from the vagina.  DIAGNOSIS   Your caregiver will perform a physical exam. You may also have an ultrasound to confirm the miscarriage. Blood or urine tests may also be ordered.  TREATMENT    Sometimes, treatment is not necessary if you naturally pass all the fetal tissue that was in the uterus. If some of the fetus or placenta remains in the body (incomplete miscarriage), tissue left behind may become infected and must be removed. Usually, a dilation and curettage (D and C) procedure is performed.  During a D and C procedure, the cervix is widened (dilated) and any remaining fetal or placental tissue is gently removed from the uterus.   Antibiotic medicines are prescribed if there is an infection. Other medicines may be given to reduce the size of the uterus (contract) if there is a lot of bleeding.   If you have Rh negative blood and your baby was Rh positive, you will need a Rh immunoglobulin shot. This shot will protect any future baby from having Rh blood problems in future pregnancies.  HOME CARE INSTRUCTIONS    Your caregiver may order bed rest or may allow you to continue light activity. Resume activity as directed by your caregiver.   Have someone help with home and family responsibilities during this time.    Keep track of the number of sanitary pads you use each day and how soaked (saturated) they are. Write down this information.    Do not use tampons. Do not douche or have sexual intercourse until approved by your caregiver.    Only take over-the-counter or prescription medicines for pain or discomfort as directed by your caregiver.    Do not take aspirin. Aspirin can cause bleeding.    Keep all follow-up appointments with your caregiver.    If you or your partner have problems with grieving, talk to your caregiver or seek counseling to help cope with the pregnancy loss. Allow enough time to grieve before trying to get pregnant again.     SEEK IMMEDIATE MEDICAL CARE IF:    You have severe cramps or pain in your back or abdomen.   You have a fever.   You pass large blood clots (walnut-sized or larger) ortissue from your vagina. Save any tissue for your caregiver to inspect.    Your bleeding increases.    You have a thick, bad-smelling vaginal discharge.   You become lightheaded, weak, or you faint.    You have chills.   MAKE SURE YOU:   Understand these instructions.   Will watch your condition.   Will get help right away if you are not doing well or get worse.     This  information is not intended to replace advice given to you by your health care provider. Make sure you discuss any questions you have with your health care provider.     Document Released: 12/22/2000 Document Revised: 10/23/2012 Document Reviewed: 08/17/2011  Elsevier Interactive Patient Education 2016 Elsevier Inc.

## 2016-03-26 NOTE — Addendum Note (Signed)
Addended by: Malachy MoodYOUNG, Trine Fread S on: 03/26/2016 11:04 AM   Modules accepted: Orders

## 2016-03-29 ENCOUNTER — Encounter (HOSPITAL_COMMUNITY)
Admission: RE | Admit: 2016-03-29 | Discharge: 2016-03-29 | Disposition: A | Discharge status: Home / Self Care | Payer: 59 | Source: Ambulatory Visit | Attending: Obstetrics & Gynecology | Admitting: Obstetrics & Gynecology

## 2016-03-29 ENCOUNTER — Encounter (HOSPITAL_COMMUNITY): Payer: Self-pay | Admitting: *Deleted

## 2016-03-29 DIAGNOSIS — O021 Missed abortion: Secondary | ICD-10-CM | POA: Diagnosis not present

## 2016-03-29 LAB — CBC
HEMATOCRIT: 39.2 % (ref 36.0–46.0)
HEMOGLOBIN: 13.5 g/dL (ref 12.0–15.0)
MCH: 31.8 pg (ref 26.0–34.0)
MCHC: 34.4 g/dL (ref 30.0–36.0)
MCV: 92.5 fL (ref 78.0–100.0)
Platelets: 202 10*3/uL (ref 150–400)
RBC: 4.24 MIL/uL (ref 3.87–5.11)
RDW: 12.5 % (ref 11.5–15.5)
WBC: 7.6 10*3/uL (ref 4.0–10.5)

## 2016-03-29 LAB — URINALYSIS, ROUTINE W REFLEX MICROSCOPIC
Bilirubin Urine: NEGATIVE
Glucose, UA: NEGATIVE mg/dL
Hgb urine dipstick: NEGATIVE
KETONES UR: NEGATIVE mg/dL
LEUKOCYTES UA: NEGATIVE
NITRITE: NEGATIVE
PH: 6.5 (ref 5.0–8.0)
Protein, ur: NEGATIVE mg/dL
Specific Gravity, Urine: 1.02 (ref 1.005–1.030)

## 2016-03-29 LAB — COMPREHENSIVE METABOLIC PANEL
ALBUMIN: 4.4 g/dL (ref 3.5–5.0)
ALK PHOS: 53 U/L (ref 38–126)
ALT: 14 U/L (ref 14–54)
AST: 17 U/L (ref 15–41)
Anion gap: 10 (ref 5–15)
BILIRUBIN TOTAL: 0.7 mg/dL (ref 0.3–1.2)
BUN: 11 mg/dL (ref 6–20)
CALCIUM: 9.4 mg/dL (ref 8.9–10.3)
CO2: 24 mmol/L (ref 22–32)
CREATININE: 0.69 mg/dL (ref 0.44–1.00)
Chloride: 104 mmol/L (ref 101–111)
GFR calc Af Amer: >60 mL/min (ref >60)
GFR calc non Af Amer: >60 mL/min (ref >60)
GLUCOSE: 97 mg/dL (ref 65–99)
Potassium: 3.5 mmol/L (ref 3.5–5.1)
Sodium: 138 mmol/L (ref 135–145)
Total Protein: 7.7 g/dL (ref 6.5–8.1)

## 2016-03-30 ENCOUNTER — Ambulatory Visit (HOSPITAL_COMMUNITY): Payer: 59 | Admitting: Anesthesiology

## 2016-03-30 ENCOUNTER — Encounter (HOSPITAL_COMMUNITY): Payer: Self-pay | Admitting: *Deleted

## 2016-03-30 ENCOUNTER — Ambulatory Visit (HOSPITAL_COMMUNITY)
Admission: RE | Admit: 2016-03-30 | Discharge: 2016-03-30 | Disposition: A | Discharge status: Home / Self Care | Payer: 59 | Source: Ambulatory Visit | Attending: Obstetrics & Gynecology | Admitting: Obstetrics & Gynecology

## 2016-03-30 ENCOUNTER — Encounter (HOSPITAL_COMMUNITY)
Admission: RE | Disposition: A | Discharge status: Home / Self Care | Payer: Self-pay | Source: Ambulatory Visit | Attending: Obstetrics & Gynecology

## 2016-03-30 DIAGNOSIS — O021 Missed abortion: Secondary | ICD-10-CM | POA: Diagnosis not present

## 2016-03-30 HISTORY — PX: DILATION AND EVACUATION: SHX1459

## 2016-03-30 SURGERY — DILATION AND EVACUATION, UTERUS
Anesthesia: General | Site: Vagina

## 2016-03-30 MED ORDER — METHYLERGONOVINE MALEATE 0.2 MG PO TABS: 0.2000 mg | ORAL_TABLET | Freq: Four times a day (QID) | ORAL | 0 refills | Status: DC

## 2016-03-30 MED ORDER — PROPOFOL 10 MG/ML IV BOLUS
INTRAVENOUS | Status: DC | PRN
  Administered 2016-03-30: 150 mg via INTRAVENOUS

## 2016-03-30 MED ORDER — KETOROLAC TROMETHAMINE 30 MG/ML IJ SOLN
30.0000 mg | Freq: Once | INTRAMUSCULAR | Status: AC
  Administered 2016-03-30: 30 mg via INTRAVENOUS
  Filled 2016-03-30: qty 1

## 2016-03-30 MED ORDER — GLYCOPYRROLATE 0.2 MG/ML IJ SOLN
INTRAMUSCULAR | Status: DC | PRN
  Administered 2016-03-30: 0.2 mg via INTRAVENOUS

## 2016-03-30 MED ORDER — ONDANSETRON HCL 4 MG/2ML IJ SOLN
INTRAMUSCULAR | Status: DC | PRN
  Administered 2016-03-30: 4 mg via INTRAVENOUS

## 2016-03-30 MED ORDER — LIDOCAINE HCL (CARDIAC) 10 MG/ML IV SOLN
INTRAVENOUS | Status: DC | PRN
  Administered 2016-03-30: 50 mg via INTRAVENOUS

## 2016-03-30 MED ORDER — SCOPOLAMINE 1 MG/3DAYS TD PT72
1.0000 | MEDICATED_PATCH | TRANSDERMAL | Status: DC
  Administered 2016-03-30: 1.5 mg via TRANSDERMAL
  Filled 2016-03-30: qty 1

## 2016-03-30 MED ORDER — MIDAZOLAM HCL 5 MG/5ML IJ SOLN
INTRAMUSCULAR | Status: DC | PRN
  Administered 2016-03-30: 2 mg via INTRAVENOUS

## 2016-03-30 MED ORDER — KETOROLAC TROMETHAMINE 10 MG PO TABS: 10.0000 mg | ORAL_TABLET | Freq: Three times a day (TID) | ORAL | 0 refills | Status: DC | PRN

## 2016-03-30 MED ORDER — CEFAZOLIN SODIUM-DEXTROSE 2-4 GM/100ML-% IV SOLN
2.0000 g | INTRAVENOUS | Status: AC
  Administered 2016-03-30: 2 g via INTRAVENOUS
  Filled 2016-03-30: qty 100

## 2016-03-30 MED ORDER — METHYLERGONOVINE MALEATE 0.2 MG/ML IJ SOLN
INTRAMUSCULAR | Status: AC
  Filled 2016-03-30: qty 1

## 2016-03-30 MED ORDER — FENTANYL CITRATE (PF) 100 MCG/2ML IJ SOLN
INTRAMUSCULAR | Status: DC | PRN
  Administered 2016-03-30: 25 ug via INTRAVENOUS
  Administered 2016-03-30: 50 ug via INTRAVENOUS
  Administered 2016-03-30: 25 ug via INTRAVENOUS

## 2016-03-30 MED ORDER — LACTATED RINGERS IV SOLN
INTRAVENOUS | Status: DC
  Administered 2016-03-30: 1000 mL via INTRAVENOUS

## 2016-03-30 MED ORDER — HYDROCODONE-ACETAMINOPHEN 5-325 MG PO TABS: 1.0000 | ORAL_TABLET | Freq: Four times a day (QID) | ORAL | 0 refills | Status: DC | PRN

## 2016-03-30 MED ORDER — METHYLERGONOVINE MALEATE 0.2 MG/ML IJ SOLN
INTRAMUSCULAR | Status: DC | PRN
  Administered 2016-03-30: 0.2 mg via INTRAMUSCULAR

## 2016-03-30 MED ORDER — HYDROMORPHONE HCL 1 MG/ML IJ SOLN: 0.2500 mg | INTRAMUSCULAR | Status: DC | PRN

## 2016-03-30 SURGICAL SUPPLY — 29 items
BAG HAMPER (MISCELLANEOUS) ×3 IMPLANT
CLOTH BEACON ORANGE TIMEOUT ST (SAFETY) ×3 IMPLANT
COVER LIGHT HANDLE STERIS (MISCELLANEOUS) ×6 IMPLANT
CURETTE VACUUM 9MM CVD CLR (CANNULA) ×3 IMPLANT
DECANTER SPIKE VIAL GLASS SM (MISCELLANEOUS) IMPLANT
FORMALIN 10 PREFIL 480ML (MISCELLANEOUS) ×3 IMPLANT
GAUZE SPONGE 4X4 16PLY XRAY LF (GAUZE/BANDAGES/DRESSINGS) ×3 IMPLANT
GLOVE BIOGEL PI IND STRL 7.0 (GLOVE) ×1 IMPLANT
GLOVE BIOGEL PI IND STRL 8 (GLOVE) ×1 IMPLANT
GLOVE BIOGEL PI INDICATOR 7.0 (GLOVE) ×2
GLOVE BIOGEL PI INDICATOR 8 (GLOVE) ×2
GLOVE ECLIPSE 8.0 STRL XLNG CF (GLOVE) ×3 IMPLANT
GOWN STRL REUS W/TWL LRG LVL3 (GOWN DISPOSABLE) ×6 IMPLANT
GOWN STRL REUS W/TWL XL LVL3 (GOWN DISPOSABLE) ×3 IMPLANT
KIT BERKELEY 1ST TRIMESTER 3/8 (MISCELLANEOUS) ×3 IMPLANT
KIT ROOM TURNOVER AP CYSTO (KITS) ×3 IMPLANT
MANIFOLD NEPTUNE II (INSTRUMENTS) ×3 IMPLANT
MARKER SKIN DUAL TIP RULER LAB (MISCELLANEOUS) ×3 IMPLANT
NS IRRIG 1000ML POUR BTL (IV SOLUTION) ×3 IMPLANT
PACK BASIC III (CUSTOM PROCEDURE TRAY) ×3
PACK SRG BSC III STRL LF ECLPS (CUSTOM PROCEDURE TRAY) ×1 IMPLANT
PAD ARMBOARD 7.5X6 YLW CONV (MISCELLANEOUS) ×3 IMPLANT
SET BERKELEY SUCTION TUBING (SUCTIONS) ×3 IMPLANT
SYRINGE 10CC LL (SYRINGE) IMPLANT
TOWEL OR 17X26 4PK STRL BLUE (TOWEL DISPOSABLE) ×3 IMPLANT
VACURETTE 10MM (CANNULA) IMPLANT
VACURETTE 12MM (CANNULA) IMPLANT
VACURETTE 14MM (CANNULA) IMPLANT
VACURETTE 8MM (CANNULA) IMPLANT

## 2016-03-30 NOTE — Anesthesia Postprocedure Evaluation (Signed)
Anesthesia Post Note  Patient: [name]  Procedure(s) Performed: Procedure(s) (LRB): DILATATION AND EVACUATION--suction D&C (N/A)  Patient location during evaluation: PACU Anesthesia Type: General Level of consciousness: awake and alert, oriented, responds to stimulation and patient cooperative Pain management: pain level controlled Vital Signs Assessment: post-procedure vital signs reviewed and stable Respiratory status: spontaneous breathing, respiratory function stable and patient connected to nasal cannula oxygen Cardiovascular status: stable Anesthetic complications: no    Last Vitals:  Vitals:   03/30/16 0730 03/30/16 0838  BP: 111/84 (!) (P) 124/93  Pulse:  (P) 76  Resp: 20 (P) 12  Temp:  (P) 36.8 C    Last Pain:  Vitals:   03/30/16 0707  TempSrc: Oral                 Augustine Leverette

## 2016-03-30 NOTE — Anesthesia Preprocedure Evaluation (Addendum)
Anesthesia Evaluation  Patient identified by MRN, date of birth, ID band Patient awake    Reviewed: Allergy & Precautions, NPO status , Patient's Chart, lab work & pertinent test results  History of Anesthesia Complications (+) PONV and history of anesthetic complications  Airway Mallampati: II  TM Distance: >3 FB Neck ROM: Full    Dental no notable dental hx. (+) Dental Advisory Given   Pulmonary    Pulmonary exam normal        Cardiovascular Normal cardiovascular exam     Neuro/Psych negative neurological ROS  negative psych ROS   GI/Hepatic negative GI ROS, Neg liver ROS,   Endo/Other  negative endocrine ROS  Renal/GU negative Renal ROS     Musculoskeletal   Abdominal   Peds  Hematology negative hematology ROS (+)   Anesthesia Other Findings   Reproductive/Obstetrics (+) Pregnancy                           Anesthesia Physical Anesthesia Plan  ASA: II  Anesthesia Plan: General   Post-op Pain Management:    Induction: Intravenous  Airway Management Planned: LMA  Additional Equipment:   Intra-op Plan:   Post-operative Plan: Extubation in OR  Informed Consent: I have reviewed the patients History and Physical, chart, labs and discussed the procedure including the risks, benefits and alternatives for the proposed anesthesia with the patient or authorized representative who has indicated his/her understanding and acceptance.   Dental advisory given  Plan Discussed with: CRNA and Anesthesiologist  Anesthesia Plan Comments:        Anesthesia Quick Evaluation

## 2016-03-30 NOTE — Discharge Instructions (Signed)
Miscarriage °A miscarriage is the sudden loss of an unborn baby (fetus) before the 20th week of pregnancy. Most miscarriages happen in the first 3 months of pregnancy. Sometimes, it happens before a woman even knows she is pregnant. A miscarriage is also called a "spontaneous miscarriage" or "early pregnancy loss." Having a miscarriage can be an emotional experience. Talk with your caregiver about any questions you may have about miscarrying, the grieving process, and your future pregnancy plans. °CAUSES  °· Problems with the fetal chromosomes that make it impossible for the baby to develop normally. Problems with the baby's genes or chromosomes are most often the result of errors that occur, by chance, as the embryo divides and grows. The problems are not inherited from the parents. °· Infection of the cervix or uterus.   °· Hormone problems.   °· Problems with the cervix, such as having an incompetent cervix. This is when the tissue in the cervix is not strong enough to hold the pregnancy.   °· Problems with the uterus, such as an abnormally shaped uterus, uterine fibroids, or congenital abnormalities.   °· Certain medical conditions.   °· Smoking, drinking alcohol, or taking illegal drugs.   °· Trauma.   °Often, the cause of a miscarriage is unknown.  °SYMPTOMS  °· Vaginal bleeding or spotting, with or without cramps or pain. °· Pain or cramping in the abdomen or lower back. °· Passing fluid, tissue, or blood clots from the vagina. °DIAGNOSIS  °Your caregiver will perform a physical exam. You may also have an ultrasound to confirm the miscarriage. Blood or urine tests may also be ordered. °TREATMENT  °· Sometimes, treatment is not necessary if you naturally pass all the fetal tissue that was in the uterus. If some of the fetus or placenta remains in the body (incomplete miscarriage), tissue left behind may become infected and must be removed. Usually, a dilation and curettage (D and C) procedure is performed.  During a D and C procedure, the cervix is widened (dilated) and any remaining fetal or placental tissue is gently removed from the uterus. °· Antibiotic medicines are prescribed if there is an infection. Other medicines may be given to reduce the size of the uterus (contract) if there is a lot of bleeding. °· If you have Rh negative blood and your baby was Rh positive, you will need a Rh immunoglobulin shot. This shot will protect any future baby from having Rh blood problems in future pregnancies. °HOME CARE INSTRUCTIONS  °· Your caregiver may order bed rest or may allow you to continue light activity. Resume activity as directed by your caregiver. °· Have someone help with home and family responsibilities during this time.   °· Keep track of the number of sanitary pads you use each day and how soaked (saturated) they are. Write down this information.   °· Do not use tampons. Do not douche or have sexual intercourse until approved by your caregiver.   °· Only take over-the-counter or prescription medicines for pain or discomfort as directed by your caregiver.   °· Do not take aspirin. Aspirin can cause bleeding.   °· Keep all follow-up appointments with your caregiver.   °· If you or your partner have problems with grieving, talk to your caregiver or seek counseling to help cope with the pregnancy loss. Allow enough time to grieve before trying to get pregnant again.   °SEEK IMMEDIATE MEDICAL CARE IF:  °· You have severe cramps or pain in your back or abdomen. °· You have a fever. °· You pass large blood clots (walnut-sized   or larger) ortissue from your vagina. Save any tissue for your caregiver to inspect.   Your bleeding increases.   You have a thick, bad-smelling vaginal discharge.  You become lightheaded, weak, or you faint.   You have chills.  MAKE SURE YOU:  Understand these instructions.  Will watch your condition.  Will get help right away if you are not doing well or get worse.   This  information is not intended to replace advice given to you by your health care provider. Make sure you discuss any questions you have with your health care provider.   Document Released: 12/22/2000 Document Revised: 10/23/2012 Document Reviewed: 08/17/2011 Elsevier Interactive Patient Education 2016 [employer]  PATIENT INSTRUCTIONS POST-ANESTHESIA  IMMEDIATELY FOLLOWING SURGERY:  Do not drive or operate machinery for the first twenty four hours after surgery.  Do not make any important decisions for twenty four hours after surgery or while taking narcotic pain medications or sedatives.  If you develop intractable nausea and vomiting or a severe headache please notify your doctor immediately.  FOLLOW-UP:  Please make an appointment with your surgeon as instructed. You do not need to follow up with anesthesia unless specifically instructed to do so.  WOUND CARE INSTRUCTIONS (if applicable):  Keep a dry clean dressing on the anesthesia/puncture wound site if there is drainage.  Once the wound has quit draining you may leave it open to air.  Generally you should leave the bandage intact for twenty four hours unless there is drainage.  If the epidural site drains for more than 36-48 hours please call the anesthesia department.  QUESTIONS?:  Please feel free to call your physician or the hospital operator if you have any questions, and they will be happy to assist you.

## 2016-03-30 NOTE — Transfer of Care (Signed)
Immediate Anesthesia Transfer of Care Note  Patient: [name]  Procedure(s) Performed: Procedure(s): DILATATION AND EVACUATION--suction D&C (N/A)  Patient Location: PACU  Anesthesia Type:General  Level of Consciousness: awake, alert , oriented, patient cooperative and responds to stimulation  Airway & Oxygen Therapy: Patient Spontanous Breathing and Patient connected to nasal cannula oxygen  Post-op Assessment: Report given to RN, Post -op Vital signs reviewed and stable and Patient moving all extremities  Post vital signs: Reviewed and stable  Last Vitals:  Vitals:   03/30/16 0725 03/30/16 0730  BP:  111/84  Pulse:    Resp: 20 20  Temp:      Last Pain:  Vitals:   03/30/16 0707  TempSrc: Oral      Patients Stated Pain Goal: 9 (03/30/16 0707)  Complications: No apparent anesthesia complications

## 2016-03-30 NOTE — Op Note (Signed)
Preoperative diagnosis:  Missed AB in the first trimester  Postoperative diagnosis:  Same as above  Procedure:  Cervical dilation with suction and sharp uterine curettage  Surgeon:  Lazaro ArmsEURE,Afiya Ferrebee H  Anesthesia:  Laryngeal mask airway  Findings:  The patient was known to have a first trimester pregnancy loss by  serial sonogram evaluations from the office.  There was fetal cardiac activity on an earlier sonogram with CRL 8 weeks, now no fetal cardiac activity and actually smaller CRL on the second sonogram consistent with a missed ab.  After discussing options including expectant management, oral/vagina cytotec vs operative management, patient and her family choose operative management.  Description of operation:  The patient was taken to the operating room and placed in the supine position.  She underwent laryngeal mask airway general anesthesia.  The patient was placed in the dorsal lithotomy position.  The vagina was prepped and draped in the usual sterile fashion.  A Graves speculum was placed.  The anterior cervix was grasped with a single-tooth tenaculum.  The cervix was dilated serially with Hegar dilators.  A #9 curved suction curette was placed in the uterus.  The suction pressure was placed at 55 and several passes were made.  All of the intrauterine contents were removed.  The sharp curette was used x1 to feel uterine crie in all areas.  The patient was given Methergine 0.2 mg IM x1.  There was good hemostasis.  The patient was given ancef and toradol preoperatively.   Estimated blood loss for the procedure was 100 cc.  The patient was awakened from anesthesia taken to the recovery room in good stable condition.  All counts were correct x3.  Lazaro ArmsEURE,Eunie Lawn H, MD 03/30/2016 8:27 AM

## 2016-03-30 NOTE — Addendum Note (Signed)
Addendum  created 03/30/16 1056 by Lillith Mcneff, MD   Sign clinical note    

## 2016-03-30 NOTE — Anesthesia Procedure Notes (Signed)
Procedure Name: LMA Insertion Date/Time: 03/30/2016 8:08 AM Performed by: Patrcia DollyMOSES, Abhiraj Dozal Pre-anesthesia Checklist: Patient identified, Patient being monitored, Timeout performed, Emergency Drugs available and Suction available Patient Re-evaluated:Patient Re-evaluated prior to inductionOxygen Delivery Method: Circle System Utilized Preoxygenation: Pre-oxygenation with 100% oxygen Intubation Type: IV induction Ventilation: Mask ventilation without difficulty Tube size: 7.0 mm Number of attempts: 1 Placement Confirmation: positive ETCO2 and breath sounds checked- equal and bilateral Tube secured with: Tape

## 2016-03-30 NOTE — H&P (Signed)
Preoperative History and Physical  [name] is a 25 y.o. G2P1001 with Patient's last menstrual period was 01/13/2016 (exact date). admitted for a suction/sharp uterine curettage for missed ab in the first trimester..  Pt had sonogram 03/08/16 which revealed a viable IUP +fetal cardiac activity and CRL [redacted] weeks gestation. Came in for initial prenatal visit 9/15 and sonogram revealed no fetal cardiac activity, confirmed by 3 scans As a result pt opted for surgical management  PMH:    Past Medical History:  Diagnosis Date  . Gestational hypertension, antepartum 06/06/2012  . No pertinent past medical history   . PONV (postoperative nausea and vomiting)   . Pyelonephritis   . Urinary tract infection     PSH:     Past Surgical History:  Procedure Laterality Date  . Bladder stem stretcher    . GALLBLADDER SURGERY    . TONSILLECTOMY      POb/GynH:      OB History    Gravida Para Term Preterm AB Living   2 1 1  0 0 1   SAB TAB Ectopic Multiple Live Births   0 0 0 0 1      SH:   Social History  Substance Use Topics  . Smoking status: Never Smoker  . Smokeless tobacco: Never Used  . Alcohol use No    FH:    Family History  Problem Relation Age of Onset  . Breast cancer Mother   . Leukemia Paternal Grandfather   . Other Neg Hx      Allergies: No Known Allergies  Medications:       Current Facility-Administered Medications:  .  ceFAZolin (ANCEF) IVPB 2g/100 mL premix, 2 g, Intravenous, On Call to OR, Lazaro Arms, MD .  lactated ringers infusion, , Intravenous, Continuous, Heather Roberts, MD, Last Rate: 10 mL/hr at 03/30/16 0728, 1,000 mL at 03/30/16 0728 .  scopolamine (TRANSDERM-SCOP) 1 MG/3DAYS 1.5 mg, 1 patch, Transdermal, Q72H, Heather Roberts, MD, 1.5 mg at 03/30/16 1610  Review of Systems:   Review of Systems  Constitutional: Negative for fever, chills, weight loss, malaise/fatigue and diaphoresis.  HENT: Negative for hearing loss, ear pain, nosebleeds,  congestion, sore throat, neck pain, tinnitus and ear discharge.   Eyes: Negative for blurred vision, double vision, photophobia, pain, discharge and redness.  Respiratory: Negative for cough, hemoptysis, sputum production, shortness of breath, wheezing and stridor.   Cardiovascular: Negative for chest pain, palpitations, orthopnea, claudication, leg swelling and PND.  Gastrointestinal: Positive for abdominal pain. Negative for heartburn, nausea, vomiting, diarrhea, constipation, blood in stool and melena.  Genitourinary: Negative for dysuria, urgency, frequency, hematuria and flank pain.  Musculoskeletal: Negative for myalgias, back pain, joint pain and falls.  Skin: Negative for itching and rash.  Neurological: Negative for dizziness, tingling, tremors, sensory change, speech change, focal weakness, seizures, loss of consciousness, weakness and headaches.  Endo/Heme/Allergies: Negative for environmental allergies and polydipsia. Does not bruise/bleed easily.  Psychiatric/Behavioral: Negative for depression, suicidal ideas, hallucinations, memory loss and substance abuse. The patient is not nervous/anxious and does not have insomnia.      PHYSICAL EXAM:  Blood pressure 111/84, pulse 81, temperature 98.2 F (36.8 C), temperature source Oral, resp. rate 20, height 5\' 6"  (1.676 m), weight 140 lb (63.5 kg), last menstrual period 01/13/2016, SpO2 98 %, unknown if currently breastfeeding.    Vitals reviewed. Constitutional: She is oriented to person, place, and time. She appears well-developed and well-nourished.  HENT:  Head: Normocephalic and atraumatic.  Right  Ear: External ear normal.  Left Ear: External ear normal.  Nose: Nose normal.  Mouth/Throat: Oropharynx is clear and moist.  Eyes: Conjunctivae and EOM are normal. Pupils are equal, round, and reactive to light. Right eye exhibits no discharge. Left eye exhibits no discharge. No scleral icterus.  Neck: Normal range of motion. Neck  supple. No tracheal deviation present. No thyromegaly present.  Cardiovascular: Normal rate, regular rhythm, normal heart sounds and intact distal pulses.  Exam reveals no gallop and no friction rub.   No murmur heard. Respiratory: Effort normal and breath sounds normal. No respiratory distress. She has no wheezes. She has no rales. She exhibits no tenderness.  GI: Soft. Bowel sounds are normal. She exhibits no distension and no mass. There is tenderness. There is no rebound and no guarding.  Genitourinary:       Vulva is normal without lesions Vagina is pink moist without discharge Cervix normal in appearance and pap is normal Uterus is normal size, contour, position, consistency, mobility, non-tender Adnexa is negative with normal sized ovaries by sonogram  Musculoskeletal: Normal range of motion. She exhibits no edema and no tenderness.  Neurological: She is alert and oriented to person, place, and time. She has normal reflexes. She displays normal reflexes. No cranial nerve deficit. She exhibits normal muscle tone. Coordination normal.  Skin: Skin is warm and dry. No rash noted. No erythema. No pallor.  Psychiatric: She has a normal mood and affect. Her behavior is normal. Judgment and thought content normal.    Labs: Results for orders placed or performed during the hospital encounter of 03/29/16 (from the past 336 hour(s))  CBC   Collection Time: 03/29/16  2:40 PM  Result Value Ref Range   WBC 7.6 4.0 - 10.5 K/uL   RBC 4.24 3.87 - 5.11 MIL/uL   Hemoglobin 13.5 12.0 - 15.0 g/dL   HCT 54.039.2 98.136.0 - 19.146.0 %   MCV 92.5 78.0 - 100.0 fL   MCH 31.8 26.0 - 34.0 pg   MCHC 34.4 30.0 - 36.0 g/dL   RDW 47.812.5 29.511.5 - 62.115.5 %   Platelets 202 150 - 400 K/uL  Comprehensive metabolic panel   Collection Time: 03/29/16  2:40 PM  Result Value Ref Range   Sodium 138 135 - 145 mmol/L   Potassium 3.5 3.5 - 5.1 mmol/L   Chloride 104 101 - 111 mmol/L   CO2 24 22 - 32 mmol/L   Glucose, Bld 97 65 - 99  mg/dL   BUN 11 6 - 20 mg/dL   Creatinine, Ser 3.080.69 0.44 - 1.00 mg/dL   Calcium 9.4 8.9 - 65.710.3 mg/dL   Total Protein 7.7 6.5 - 8.1 g/dL   Albumin 4.4 3.5 - 5.0 g/dL   AST 17 15 - 41 U/L   ALT 14 14 - 54 U/L   Alkaline Phosphatase 53 38 - 126 U/L   Total Bilirubin 0.7 0.3 - 1.2 mg/dL   GFR calc non Af Amer >60 >60 mL/min   GFR calc Af Amer >60 >60 mL/min   Anion gap 10 5 - 15  Urinalysis, Routine w reflex microscopic (not [facility])   Collection Time: 03/29/16  2:40 PM  Result Value Ref Range   Color, Urine YELLOW YELLOW   APPearance CLEAR CLEAR   Specific Gravity, Urine 1.020 1.005 - 1.030   pH 6.5 5.0 - 8.0   Glucose, UA NEGATIVE NEGATIVE mg/dL   Hgb urine dipstick NEGATIVE NEGATIVE   Bilirubin Urine NEGATIVE NEGATIVE  Ketones, ur NEGATIVE NEGATIVE mg/dL   Protein, ur NEGATIVE NEGATIVE mg/dL   Nitrite NEGATIVE NEGATIVE   Leukocytes, UA NEGATIVE NEGATIVE  Results for orders placed or performed in visit on 03/26/16 (from the past 336 hour(s))  POCT Urinalysis Dipstick   Collection Time: 03/26/16  9:50 AM  Result Value Ref Range   Color, UA     Clarity, UA     Glucose, UA neg    Bilirubin, UA     Ketones, UA neg    Spec Grav, UA     Blood, UA neg    pH, UA     Protein, UA neg    Urobilinogen, UA     Nitrite, UA neg    Leukocytes, UA Negative Negative    EKG: Orders placed or performed during the hospital encounter of 12/05/10  . EKG  . EKG    Imaging Studies: US Ob Comp Less 14 Wks  Result Date: 03/08/2016 DATING AND VIABILITY SONOGRAM [name] is a 25 y.o. year old G2P1001 with LMP 01/13/19/18 which would correlate to  [redacted]w[redacted]d weeks gestation.  She has regular menstrual cycles.   She is here today for a confirmatory initial sonogram. GESTATION:SINGLETON   FETAL ACTIVITY:          Heart rate         162 bpm          CERVIX:Appears closed ADNEXA: The ovaries are normal. GESTATIONAL AGE AND  BIOMETRICS: Gestational criteria: Estimated Date of Delivery: 10/19/16 by  LMP now at [redacted]w[redacted]d Previous Scans:0    CROWN RUMP LENGTH           16.6 mm         8 weeks                                                                      AVERAGE EGA(BY THIS SCAN):  8 weeks WORKING EDD( LMP ):  10/19/2016  TECHNICIAN COMMENTS: [place] 7+6 wks,single IUP w/ys,pos fht 162 bpm,crl 16.6 mm A copy of this report including all images has been saved and backed up to a second source for retrieval if needed. All measures and details of the anatomical scan, placentation, fluid volume and pelvic anatomy are contained in that report. Amber Flora Lipps 03/08/2016 4:33 PM Clinical Impression and recommendations: I have reviewed the sonogram results above. Combined with the patient's current clinical course, below are my impressions and any appropriate recommendations for management based on the sonographic findings: 1.Singleton intrauterine pregnancy, fetal cardiac activity and yolk sac, consistent with menstrual EDD of 10/19/2016 Christin Bach V  US Ob Transvaginal  Result Date: 03/26/2016 FOLLOW UP SONOGRAM [name] is in the office for a follow up sonogram for viablitiy. She is a 25 y.o. year old G32P1001 with Estimated Date of Delivery: 10/19/16 by LMP now at  [redacted]w[redacted]d weeks gestation. Thus far the pregnancy has been complicated by no fht seen during office visit. GESTATION: SINGLETON FETAL ACTIVITY:          Heart rate         NO FHT          The fetus is inactive. AMNIOTIC FLUID: The amniotic fluid volume is  normal CERVIX: Appears closed ADNEXA:  The ovaries are normal. GESTATIONAL AGE AND  BIOMETRICS: Gestational criteria: Estimated Date of Delivery: 10/19/16 by LMP now at [redacted]w[redacted]d Previous Scans:1 GESTATIONAL SAC           45.2 mm         10 weeks CROWN RUMP LENGTH            14.6 mm         7+6 weeks                                                 AVERAGE EGA(BY THIS SCAN):  7+6 weeks                                                SUSPECTED ABNORMALITIES:  yes/NO FHT QUALITY OF SCAN: satisfactory TECHNICIAN  COMMENTS: US TV 7+6 wks,single IUP, NO FHT ,CRL 14.9 mm,GS 45.2 mm,normal ov's bilat,results discussed with Victorino Dike A copy of this report including all images has been saved and backed up to a second source for retrieval if needed. All measures and details of the anatomical scan, placentation, fluid volume and pelvic anatomy are contained in that report. Amber Flora Lipps 03/26/2016 10:44 AM      Assessment: Missed ab in the first trimester  Plan: Cervical dilation, suction and sharp curettage as management for non viable first trimester pregnancy  Amish Mintzer H 03/30/2016 7:44 AM

## 2016-03-30 NOTE — Anesthesia Postprocedure Evaluation (Signed)
Anesthesia Post Note  Patient: [name]  Procedure(s) Performed: Procedure(s) (LRB): DILATATION AND EVACUATION--suction D&C (N/A)  Patient location during evaluation: PACU Anesthesia Type: General Level of consciousness: sedated Pain management: pain level controlled Vital Signs Assessment: post-procedure vital signs reviewed and stable Respiratory status: spontaneous breathing and respiratory function stable Cardiovascular status: stable Anesthetic complications: no    Last Vitals:  Vitals:   03/30/16 0915 03/30/16 0924  BP: 114/81 112/86  Pulse: 72 86  Resp: 16 18  Temp:  36.8 C    Last Pain:  Vitals:   03/30/16 0924  TempSrc: Oral                 Mackenzie Lia DANIEL

## 2016-04-02 ENCOUNTER — Encounter (HOSPITAL_COMMUNITY): Payer: Self-pay | Admitting: Obstetrics & Gynecology

## 2016-04-12 ENCOUNTER — Encounter: Payer: 59 | Admitting: Obstetrics & Gynecology

## 2016-04-13 ENCOUNTER — Encounter: Payer: Self-pay | Admitting: Obstetrics & Gynecology

## 2016-04-13 ENCOUNTER — Ambulatory Visit (INDEPENDENT_AMBULATORY_CARE_PROVIDER_SITE_OTHER): Payer: 59 | Admitting: Obstetrics & Gynecology

## 2016-04-13 VITALS — BP 100/60 | HR 76 | Wt 139.0 lb

## 2016-04-13 DIAGNOSIS — Z9889 Other specified postprocedural states: Secondary | ICD-10-CM

## 2016-04-13 MED ORDER — NORGESTIM-ETH ESTRAD TRIPHASIC 0.18/0.215/0.25 MG-25 MCG PO TABS: 1.0000 | ORAL_TABLET | Freq: Every day | ORAL | 11 refills | Status: DC

## 2016-04-13 NOTE — Progress Notes (Signed)
  HPI: Patient returns for routine postoperative follow-up having undergone suction and sharp uterine curettage on 03/30/2016.  The patient's immediate postoperative recovery has been unremarkable. Since hospital discharge the patient reports no problems, bleeding has stopped.   No current outpatient prescriptions on file. No current facility-administered medications for this visit.     Blood pressure 100/60, pulse 76, weight 139 lb (63 kg), last menstrual period 01/13/2016, unknown if currently breastfeeding.  Physical Exam: SSE no blood in vault Cervix No CMT uterus normal size non tender  Diagnostic Tests:   Pathology: benign  Impression: S/P D&C for first triester pregnancy loss  Plan: Restart OCP at patient's request  Follow up: Prn/yearly    Meds ordered this encounter  Medications  . Norgestimate-Ethinyl Estradiol Triphasic 0.18/0.215/0.25 MG-25 MCG tab    Sig: Take 1 tablet by mouth daily.    Dispense:  1 Package    Refill:  11     Lazaro ArmsEURE,LUTHER H, MD

## 2016-11-18 DIAGNOSIS — Z1389 Encounter for screening for other disorder: Secondary | ICD-10-CM | POA: Diagnosis not present

## 2016-11-18 DIAGNOSIS — Z6822 Body mass index (BMI) 22.0-22.9, adult: Secondary | ICD-10-CM | POA: Diagnosis not present

## 2016-11-18 DIAGNOSIS — L259 Unspecified contact dermatitis, unspecified cause: Secondary | ICD-10-CM | POA: Diagnosis not present

## 2017-03-04 ENCOUNTER — Other Ambulatory Visit: Payer: Self-pay | Admitting: Obstetrics & Gynecology

## 2018-03-02 ENCOUNTER — Other Ambulatory Visit: Payer: Self-pay | Admitting: Obstetrics & Gynecology

## 2018-05-01 ENCOUNTER — Other Ambulatory Visit (HOSPITAL_COMMUNITY): Payer: Self-pay | Admitting: Physician Assistant

## 2018-05-01 ENCOUNTER — Ambulatory Visit (HOSPITAL_COMMUNITY)
Admission: RE | Admit: 2018-05-01 | Discharge: 2018-05-01 | Disposition: A | Discharge status: Home / Self Care | Payer: 59 | Source: Ambulatory Visit | Attending: Physician Assistant | Admitting: Physician Assistant

## 2018-05-01 DIAGNOSIS — Z1389 Encounter for screening for other disorder: Secondary | ICD-10-CM | POA: Diagnosis not present

## 2018-05-01 DIAGNOSIS — R0602 Shortness of breath: Secondary | ICD-10-CM | POA: Insufficient documentation

## 2018-05-01 DIAGNOSIS — R0789 Other chest pain: Secondary | ICD-10-CM | POA: Diagnosis not present

## 2018-05-01 DIAGNOSIS — Z0000 Encounter for general adult medical examination without abnormal findings: Secondary | ICD-10-CM | POA: Diagnosis not present

## 2018-05-01 DIAGNOSIS — Z6822 Body mass index (BMI) 22.0-22.9, adult: Secondary | ICD-10-CM | POA: Diagnosis not present

## 2018-05-01 DIAGNOSIS — E785 Hyperlipidemia, unspecified: Secondary | ICD-10-CM | POA: Diagnosis not present

## 2018-05-01 DIAGNOSIS — R05 Cough: Secondary | ICD-10-CM | POA: Diagnosis not present

## 2019-03-12 ENCOUNTER — Other Ambulatory Visit: Payer: Self-pay | Admitting: Obstetrics & Gynecology

## 2019-06-01 ENCOUNTER — Other Ambulatory Visit: Payer: Self-pay

## 2019-06-01 ENCOUNTER — Ambulatory Visit (HOSPITAL_COMMUNITY)
Admission: RE | Admit: 2019-06-01 | Discharge: 2019-06-01 | Disposition: A | Discharge status: Home / Self Care | Payer: 59 | Source: Ambulatory Visit | Attending: Family Medicine | Admitting: Family Medicine

## 2019-06-01 ENCOUNTER — Other Ambulatory Visit (HOSPITAL_COMMUNITY): Payer: Self-pay | Admitting: Family Medicine

## 2019-06-01 DIAGNOSIS — S8001XA Contusion of right knee, initial encounter: Secondary | ICD-10-CM

## 2019-06-01 DIAGNOSIS — M25561 Pain in right knee: Secondary | ICD-10-CM | POA: Diagnosis not present

## 2020-03-05 ENCOUNTER — Other Ambulatory Visit: Payer: Self-pay | Admitting: Obstetrics & Gynecology

## 2020-03-07 DIAGNOSIS — Z20822 Contact with and (suspected) exposure to covid-19: Secondary | ICD-10-CM | POA: Diagnosis not present

## 2020-10-14 ENCOUNTER — Telehealth: Payer: 59 | Admitting: Emergency Medicine

## 2020-10-14 DIAGNOSIS — R399 Unspecified symptoms and signs involving the genitourinary system: Secondary | ICD-10-CM | POA: Diagnosis not present

## 2020-10-14 MED ORDER — CEPHALEXIN 500 MG PO CAPS: 500.0000 mg | ORAL_CAPSULE | Freq: Two times a day (BID) | ORAL | 0 refills | Status: AC

## 2020-10-14 NOTE — Progress Notes (Signed)

## 2021-02-17 ENCOUNTER — Ambulatory Visit (INDEPENDENT_AMBULATORY_CARE_PROVIDER_SITE_OTHER): Payer: PRIVATE HEALTH INSURANCE | Admitting: *Deleted

## 2021-02-17 ENCOUNTER — Encounter: Payer: Self-pay | Admitting: *Deleted

## 2021-02-17 ENCOUNTER — Other Ambulatory Visit: Payer: Self-pay

## 2021-02-17 VITALS — Ht 66.0 in | Wt 164.0 lb

## 2021-02-17 DIAGNOSIS — Z3201 Encounter for pregnancy test, result positive: Secondary | ICD-10-CM | POA: Diagnosis not present

## 2021-02-17 LAB — POCT URINE PREGNANCY: Preg Test, Ur: POSITIVE — AB

## 2021-02-17 NOTE — Progress Notes (Signed)
Chart reviewed for nurse visit. Agree with plan of care.  Adline Potter, NP 02/17/2021 11:23 AM

## 2021-02-17 NOTE — Progress Notes (Signed)
   NURSE VISIT- PREGNANCY CONFIRMATION   SUBJECTIVE:  [name] is a 30 y.o. G65P1011 female at [redacted]w[redacted]d by certain LMP of Patient's last menstrual period was 01/13/2021. Here for pregnancy confirmation.  Home pregnancy test: positive x 3   She reports cramping.  She is taking prenatal vitamins.    OBJECTIVE:  Ht 5\' 6"  (1.676 m)   Wt 164 lb (74.4 kg)   LMP 01/13/2021   Breastfeeding No   BMI 26.47 kg/m   Appears well, in no apparent distress  Results for orders placed or performed in visit on 02/17/21 (from the past 24 hour(s))  POCT urine pregnancy   Collection Time: 02/17/21 10:59 AM  Result Value Ref Range   Preg Test, Ur Positive (A) Negative    ASSESSMENT: Positive pregnancy test, [redacted]w[redacted]d by LMP    PLAN: Schedule for dating ultrasound in 3 weeks Prenatal vitamins: continue   Nausea medicines: not currently needed   OB packet given: Yes  [redacted]w[redacted]d  02/17/2021 11:06 AM

## 2021-02-27 ENCOUNTER — Telehealth: Payer: Self-pay | Admitting: Obstetrics & Gynecology

## 2021-02-27 ENCOUNTER — Telehealth: Payer: Self-pay | Admitting: Women's Health

## 2021-02-27 NOTE — Telephone Encounter (Signed)
PT HAS A FEW MORE QUESTIONS BUT OVER THE COUNTER FOR [place] WANTING TO KNOW IF YOU COULD MSG HER THROUGH MYCHART FOR SOME REASON SHE STATES THAT IT IS NOT ALLOWING HER TO SEND MSG

## 2021-03-09 ENCOUNTER — Other Ambulatory Visit: Payer: Self-pay | Admitting: Adult Health

## 2021-03-09 DIAGNOSIS — O3680X0 Pregnancy with inconclusive fetal viability, not applicable or unspecified: Secondary | ICD-10-CM

## 2021-03-10 ENCOUNTER — Other Ambulatory Visit: Payer: Self-pay

## 2021-03-10 ENCOUNTER — Ambulatory Visit (INDEPENDENT_AMBULATORY_CARE_PROVIDER_SITE_OTHER): Payer: PRIVATE HEALTH INSURANCE

## 2021-03-10 DIAGNOSIS — O3680X0 Pregnancy with inconclusive fetal viability, not applicable or unspecified: Secondary | ICD-10-CM | POA: Diagnosis not present

## 2021-03-10 DIAGNOSIS — Z3A08 8 weeks gestation of pregnancy: Secondary | ICD-10-CM

## 2021-03-10 NOTE — Progress Notes (Signed)
[place] 9 wks,single IUP with YS,positive FHT 169 bpm,normal ovaries,CRL 23.28 mm

## 2021-03-17 ENCOUNTER — Other Ambulatory Visit: Payer: Self-pay

## 2021-03-17 ENCOUNTER — Other Ambulatory Visit (INDEPENDENT_AMBULATORY_CARE_PROVIDER_SITE_OTHER): Payer: PRIVATE HEALTH INSURANCE

## 2021-03-17 DIAGNOSIS — R399 Unspecified symptoms and signs involving the genitourinary system: Secondary | ICD-10-CM | POA: Diagnosis not present

## 2021-03-17 LAB — POCT URINALYSIS DIPSTICK OB
Blood, UA: NEGATIVE
Glucose, UA: NEGATIVE
Ketones, UA: NEGATIVE
Nitrite, UA: NEGATIVE
POC,PROTEIN,UA: NEGATIVE

## 2021-03-17 MED ORDER — CEPHALEXIN 500 MG PO CAPS: 500.0000 mg | ORAL_CAPSULE | Freq: Four times a day (QID) | ORAL | 0 refills | Status: DC

## 2021-03-17 NOTE — Progress Notes (Addendum)
   NURSE VISIT- UTI SYMPTOMS   SUBJECTIVE:  [name] is a 30 y.o. G54P1011 female here for UTI symptoms. She is [redacted]w[redacted]d pregnant. She reports urinary frequency, urinary urgency, and burning with urination .  OBJECTIVE:  LMP 01/13/2021   Appears well, in no apparent distress  Results for orders placed or performed in visit on 03/17/21 (from the past 24 hour(s))  POC Urinalysis Dipstick OB   Collection Time: 03/17/21  3:41 PM  Result Value Ref Range   Color, UA     Clarity, UA     Glucose, UA Negative Negative   Bilirubin, UA     Ketones, UA neg    Spec Grav, UA     Blood, UA neg    pH, UA     POC,PROTEIN,UA Negative Negative, Trace, Small (1+), Moderate (2+), Large (3+), 4+   Urobilinogen, UA     Nitrite, UA neg    Leukocytes, UA Trace (A) Negative   Appearance     Odor      ASSESSMENT: Pregnancy [redacted]w[redacted]d with UTI symptoms and negative nitrites  PLAN: Note routed to Joellyn Haff, CNM, [facility]   Rx sent by provider today: Yes Urine culture sent Call or return to clinic prn if these symptoms worsen or fail to improve as anticipated. Follow-up: as scheduled   Vinod Mikesell A Clothilde Tippetts  03/17/2021 3:41 PM   Chart reviewed for nurse visit. Agree with plan of care. Rx keflex Cheral Marker, [place] 03/17/2021 3:46 PM

## 2021-03-17 NOTE — Addendum Note (Signed)
Addended by: Cheral Marker on: 03/17/2021 03:46 PM   Modules accepted: Orders

## 2021-03-19 ENCOUNTER — Telehealth: Payer: Self-pay | Admitting: Obstetrics & Gynecology

## 2021-03-19 LAB — URINE CULTURE

## 2021-03-19 NOTE — Telephone Encounter (Signed)
[name] is calling her las name she states that you know her by is [name], she is wanting to speak to you directly about a lump she found on her breast.

## 2021-03-19 NOTE — Telephone Encounter (Signed)
I called the patient and will examine her at her NOB appt, most likely a sebaceous cyst based on patient's description

## 2021-03-26 ENCOUNTER — Other Ambulatory Visit: Payer: Self-pay | Admitting: Advanced Practice Midwife

## 2021-03-26 MED ORDER — PROMETHAZINE HCL 25 MG PO TABS: 25.0000 mg | ORAL_TABLET | Freq: Four times a day (QID) | ORAL | 1 refills | Status: DC | PRN

## 2021-03-26 NOTE — Progress Notes (Signed)
Phenergan for nausea

## 2021-04-03 ENCOUNTER — Encounter: Payer: PRIVATE HEALTH INSURANCE | Admitting: Advanced Practice Midwife

## 2021-04-03 ENCOUNTER — Ambulatory Visit: Payer: PRIVATE HEALTH INSURANCE | Admitting: *Deleted

## 2021-04-03 ENCOUNTER — Other Ambulatory Visit: Payer: PRIVATE HEALTH INSURANCE

## 2021-04-03 ENCOUNTER — Other Ambulatory Visit: Payer: Self-pay | Admitting: Obstetrics & Gynecology

## 2021-04-03 DIAGNOSIS — Z3682 Encounter for antenatal screening for nuchal translucency: Secondary | ICD-10-CM

## 2021-04-06 ENCOUNTER — Other Ambulatory Visit (HOSPITAL_COMMUNITY)
Admission: RE | Admit: 2021-04-06 | Discharge: 2021-04-06 | Disposition: A | Discharge status: Home / Self Care | Payer: PRIVATE HEALTH INSURANCE | Source: Ambulatory Visit | Attending: Advanced Practice Midwife | Admitting: Advanced Practice Midwife

## 2021-04-06 ENCOUNTER — Ambulatory Visit (INDEPENDENT_AMBULATORY_CARE_PROVIDER_SITE_OTHER): Payer: PRIVATE HEALTH INSURANCE | Admitting: Women's Health

## 2021-04-06 ENCOUNTER — Ambulatory Visit (INDEPENDENT_AMBULATORY_CARE_PROVIDER_SITE_OTHER): Payer: PRIVATE HEALTH INSURANCE

## 2021-04-06 ENCOUNTER — Other Ambulatory Visit: Payer: Self-pay

## 2021-04-06 ENCOUNTER — Encounter: Payer: Self-pay | Admitting: Women's Health

## 2021-04-06 ENCOUNTER — Ambulatory Visit: Payer: PRIVATE HEALTH INSURANCE | Admitting: *Deleted

## 2021-04-06 VITALS — BP 123/84 | HR 76 | Wt 169.0 lb

## 2021-04-06 DIAGNOSIS — Z3490 Encounter for supervision of normal pregnancy, unspecified, unspecified trimester: Secondary | ICD-10-CM | POA: Insufficient documentation

## 2021-04-06 DIAGNOSIS — Z3480 Encounter for supervision of other normal pregnancy, unspecified trimester: Secondary | ICD-10-CM | POA: Diagnosis not present

## 2021-04-06 DIAGNOSIS — Z3481 Encounter for supervision of other normal pregnancy, first trimester: Secondary | ICD-10-CM

## 2021-04-06 DIAGNOSIS — Z3682 Encounter for antenatal screening for nuchal translucency: Secondary | ICD-10-CM

## 2021-04-06 DIAGNOSIS — Z3A12 12 weeks gestation of pregnancy: Secondary | ICD-10-CM | POA: Diagnosis not present

## 2021-04-06 DIAGNOSIS — Z803 Family history of malignant neoplasm of breast: Secondary | ICD-10-CM | POA: Insufficient documentation

## 2021-04-06 DIAGNOSIS — Z8759 Personal history of other complications of pregnancy, childbirth and the puerperium: Secondary | ICD-10-CM | POA: Insufficient documentation

## 2021-04-06 LAB — POCT URINALYSIS DIPSTICK OB
Blood, UA: NEGATIVE
Glucose, UA: NEGATIVE
Ketones, UA: NEGATIVE
Leukocytes, UA: NEGATIVE
Nitrite, UA: NEGATIVE
POC,PROTEIN,UA: NEGATIVE

## 2021-04-06 MED ORDER — ASPIRIN 81 MG PO TBEC
162.0000 mg | DELAYED_RELEASE_TABLET | Freq: Every day | ORAL | 2 refills | Status: DC
Start: 2021-04-06 — End: 2021-09-25

## 2021-04-06 NOTE — Patient Instructions (Signed)
[name], thank you for choosing our office today! We appreciate the opportunity to meet your healthcare needs. You may receive a short survey by mail, e-mail, or through MyChart. If you are happy with your care we would appreciate if you could take just a few minutes to complete the survey questions. We read all of your comments and take your feedback very seriously. Thank you again for choosing our office.  [facility] ([address] [place], [place] [address]) Entrance C, located off of [address] Free 24/7 valet parking   Nausea & Vomiting  Have saltine crackers or pretzels by your bed and eat a few bites before you raise your head out of bed in the morning  Eat small frequent meals throughout the day instead of large meals  Drink plenty of fluids throughout the day to stay hydrated, just don't drink a lot of fluids with your meals.  This can make your stomach fill up faster making you feel sick  Do not brush your teeth right after you eat  Products with real ginger are good for nausea, like ginger ale and ginger hard candy Make sure it says made with real ginger!  Sucking on sour candy like lemon heads is also good for nausea  If your prenatal vitamins make you nauseated, take them at night so you will sleep through the nausea  Sea Bands  If you feel like you need medicine for the nausea & vomiting please let us know  If you are unable to keep any fluids or food down please let us know   Constipation  Drink plenty of fluid, preferably water, throughout the day  Eat foods high in fiber such as fruits, vegetables, and grains  Exercise, such as walking, is a good way to keep your bowels regular  Drink warm fluids, especially warm prune juice, or decaf coffee  Eat a 1/2 cup of real oatmeal (not instant), 1/2 cup applesauce, and 1/2-1 cup warm prune juice every day  If needed, you may take Colace  (docusate sodium) stool softener once or twice a day to help keep the stool soft.   If you still are having problems with constipation, you may take Miralax once daily as needed to help keep your bowels regular.   Home Blood Pressure Monitoring for Patients   Your provider has recommended that you check your blood pressure (BP) at least once a week at home. If you do not have a blood pressure cuff at home, one will be provided for you. Contact your provider if you have not received your monitor within 1 week.   Helpful Tips for Accurate Home Blood Pressure Checks  . Don't smoke, exercise, or drink caffeine 30 minutes before checking your BP . Use the restroom before checking your BP (a full bladder can raise your pressure) . Relax in a comfortable upright chair . Feet on the ground . Left arm resting comfortably on a flat surface at the level of your heart . Legs uncrossed . Back supported . Sit quietly and don't talk . Place the cuff on your bare arm . Adjust snuggly, so that only two fingertips can fit between your skin and the top of the cuff . Check 2 readings separated by at least one minute . Keep a log of your BP readings . For a visual, please reference this diagram: http://ccnc.care/bpdiagram  Provider Name: [facility]       Phone: [phone]  Zone 1: ALL CLEAR  Continue to monitor your symptoms:  . BP reading is less than 140 (top number) or less than 90 (bottom number)  . No right upper stomach pain . No headaches or seeing spots . No feeling nauseated or throwing up . No swelling in face and hands  Zone 2: CAUTION Call your doctor's office for any of the following:  . BP reading is greater than 140 (top number) or greater than 90 (bottom number)  . Stomach pain under your ribs in the middle or right side . Headaches or seeing spots . Feeling nauseated or throwing up . Swelling in face and hands  Zone 3: EMERGENCY  Seek immediate medical care if you have  any of the following:  . BP reading is greater than160 (top number) or greater than 110 (bottom number) . Severe headaches not improving with Tylenol . Serious difficulty catching your breath . Any worsening symptoms from Zone 2    First Trimester of Pregnancy The first trimester of pregnancy is from week 1 until the end of week 12 (months 1 through 3). A week after a sperm fertilizes an egg, the egg will implant on the wall of the uterus. This embryo will begin to develop into a baby. Genes from you and your partner are forming the baby. The female genes determine whether the baby is a boy or a girl. At 6-8 weeks, the eyes and face are formed, and the heartbeat can be seen on ultrasound. At the end of 12 weeks, all the baby's organs are formed.  Now that you are pregnant, you will want to do everything you can to have a healthy baby. Two of the most important things are to get good prenatal care and to follow your health care provider's instructions. Prenatal care is all the medical care you receive before the baby's birth. This care will help prevent, find, and treat any problems during the pregnancy and childbirth. BODY CHANGES Your body goes through many changes during pregnancy. The changes vary from woman to woman.   You may gain or lose a couple of pounds at first.  You may feel sick to your stomach (nauseous) and throw up (vomit). If the vomiting is uncontrollable, call your health care provider.  You may tire easily.  You may develop headaches that can be relieved by medicines approved by your health care provider.  You may urinate more often. Painful urination may mean you have a bladder infection.  You may develop heartburn as a result of your pregnancy.  You may develop constipation because certain hormones are causing the muscles that push waste through your intestines to slow down.  You may develop hemorrhoids or swollen, bulging veins (varicose veins).  Your breasts may begin  to grow larger and become tender. Your nipples may stick out more, and the tissue that surrounds them (areola) may become darker.  Your gums may bleed and may be sensitive to brushing and flossing.  Dark spots or blotches (chloasma, mask of pregnancy) may develop on your face. This will likely fade after the baby is born.  Your menstrual periods will stop.  You may have a loss of appetite.  You may develop cravings for certain kinds of food.  You may have changes in your emotions from day to day, such as being excited to be pregnant or being concerned that something may go wrong with the pregnancy and baby.  You may have more vivid and strange   dreams.  You may have changes in your hair. These can include thickening of your hair, rapid growth, and changes in texture. Some women also have hair loss during or after pregnancy, or hair that feels dry or thin. Your hair will most likely return to normal after your baby is born. WHAT TO EXPECT AT YOUR PRENATAL VISITS During a routine prenatal visit:  You will be weighed to make sure you and the baby are growing normally.  Your blood pressure will be taken.  Your abdomen will be measured to track your baby's growth.  The fetal heartbeat will be listened to starting around week 10 or 12 of your pregnancy.  Test results from any previous visits will be discussed. Your health care provider may ask you:  How you are feeling.  If you are feeling the baby move.  If you have had any abnormal symptoms, such as leaking fluid, bleeding, severe headaches, or abdominal cramping.  If you have any questions. Other tests that may be performed during your first trimester include:  Blood tests to find your blood type and to check for the presence of any previous infections. They will also be used to check for low iron levels (anemia) and Rh antibodies. Later in the pregnancy, blood tests for diabetes will be done along with other tests if problems  develop.  Urine tests to check for infections, diabetes, or protein in the urine.  An ultrasound to confirm the proper growth and development of the baby.  An amniocentesis to check for possible genetic problems.  Fetal screens for spina bifida and Down syndrome.  You may need other tests to make sure you and the baby are doing well. HOME CARE INSTRUCTIONS  Medicines  Follow your health care provider's instructions regarding medicine use. Specific medicines may be either safe or unsafe to take during pregnancy.  Take your prenatal vitamins as directed.  If you develop constipation, try taking a stool softener if your health care provider approves. Diet  Eat regular, well-balanced meals. Choose a variety of foods, such as meat or vegetable-based protein, fish, milk and low-fat dairy products, vegetables, fruits, and whole grain breads and cereals. Your health care provider will help you determine the amount of weight gain that is right for you.  Avoid raw meat and uncooked cheese. These carry germs that can cause birth defects in the baby.  Eating four or five small meals rather than three large meals a day may help relieve nausea and vomiting. If you start to feel nauseous, eating a few soda crackers can be helpful. Drinking liquids between meals instead of during meals also seems to help nausea and vomiting.  If you develop constipation, eat more high-fiber foods, such as fresh vegetables or fruit and whole grains. Drink enough fluids to keep your urine clear or pale yellow. Activity and Exercise  Exercise only as directed by your health care provider. Exercising will help you:  Control your weight.  Stay in shape.  Be prepared for labor and delivery.  Experiencing pain or cramping in the lower abdomen or low back is a good sign that you should stop exercising. Check with your health care provider before continuing normal exercises.  Try to avoid standing for long periods of  time. Move your legs often if you must stand in one place for a long time.  Avoid heavy lifting.  Wear low-heeled shoes, and practice good posture.  You may continue to have sex unless your health care provider directs   you otherwise. Relief of Pain or Discomfort  Wear a good support bra for breast tenderness.    Take warm sitz baths to soothe any pain or discomfort caused by hemorrhoids. Use hemorrhoid cream if your health care provider approves.    Rest with your legs elevated if you have leg cramps or low back pain.  If you develop varicose veins in your legs, wear support hose. Elevate your feet for 15 minutes, 3-4 times a day. Limit salt in your diet. Prenatal Care  Schedule your prenatal visits by the twelfth week of pregnancy. They are usually scheduled monthly at first, then more often in the last 2 months before delivery.  Write down your questions. Take them to your prenatal visits.  Keep all your prenatal visits as directed by your health care provider. Safety  Wear your seat belt at all times when driving.  Make a list of emergency phone numbers, including numbers for family, friends, the hospital, and police and fire departments. General Tips  Ask your health care provider for a referral to a local prenatal education class. Begin classes no later than at the beginning of month 6 of your pregnancy.  Ask for help if you have counseling or nutritional needs during pregnancy. Your health care provider can offer advice or refer you to specialists for help with various needs.  Do not use hot tubs, steam rooms, or saunas.  Do not douche or use tampons or scented sanitary pads.  Do not cross your legs for long periods of time.  Avoid cat litter boxes and soil used by cats. These carry germs that can cause birth defects in the baby and possibly loss of the fetus by miscarriage or stillbirth.  Avoid all smoking, herbs, alcohol, and medicines not prescribed by your health  care provider. Chemicals in these affect the formation and growth of the baby.  Schedule a dentist appointment. At home, brush your teeth with a soft toothbrush and be gentle when you floss. SEEK MEDICAL CARE IF:   You have dizziness.  You have mild pelvic cramps, pelvic pressure, or nagging pain in the abdominal area.  You have persistent nausea, vomiting, or diarrhea.  You have a bad smelling vaginal discharge.  You have pain with urination.  You notice increased swelling in your face, hands, legs, or ankles. SEEK IMMEDIATE MEDICAL CARE IF:   You have a fever.  You are leaking fluid from your vagina.  You have spotting or bleeding from your vagina.  You have severe abdominal cramping or pain.  You have rapid weight gain or loss.  You vomit blood or material that looks like coffee grounds.  You are exposed to German measles and have never had them.  You are exposed to fifth disease or chickenpox.  You develop a severe headache.  You have shortness of breath.  You have any kind of trauma, such as from a fall or a car accident. Document Released: 06/22/2001 Document Revised: 11/12/2013 Document Reviewed: 05/08/2013 ExitCare Patient Information 2015 [employer]. This information is not intended to replace advice given to you by your health care provider. Make sure you discuss any questions you have with your health care provider.   

## 2021-04-06 NOTE — Progress Notes (Signed)
INITIAL OBSTETRICAL VISIT Patient name: [name] MRN [id number]  Date of birth: 1991-01-16 Chief Complaint:   Initial Prenatal Visit  History of Present Illness:   [name] is a 30 y.o. G64P1011 Caucasian female at [redacted]w[redacted]d by [place] at 9 weeks with an Estimated Date of Delivery: 10/13/21 being seen today for her initial obstetrical visit.   Patient's last menstrual period was 01/13/2021. Her obstetrical history is significant for  term uncomplicated SVB w/ GHTN dx on admission, SAB x 1 .   Today she reports  some nausea, vit b6/unisom helps, trying to wean off . Found lump Rt upper outer quadrant Rt breast recently, no changes. Had discussed w/ LHE who said he would assess today at her visit. Pt's mom dx w/ breast cancer @ 40-41yo, she passed away in 04/25/23 of last year from DIC/covid s/p remdesivir.   Last pap 2016. Results were:  neg  Depression screen [facility] 2/9 04/06/2021  Decreased Interest 0  Down, Depressed, Hopeless 0  PHQ - 2 Score 0  Altered sleeping 0  Tired, decreased energy 0  Change in appetite 0  Feeling bad or failure about yourself  0  Trouble concentrating 0  Moving slowly or fidgety/restless 0  Suicidal thoughts 0  PHQ-9 Score 0     GAD 7 : Generalized Anxiety Score 04/06/2021  Nervous, Anxious, on Edge 0  Control/stop worrying 0  Worry too much - different things 0  Trouble relaxing 0  Restless 0  Easily annoyed or irritable 0  Afraid - awful might happen 0  Total GAD 7 Score 0     Review of Systems:   Pertinent items are noted in HPI Denies cramping/contractions, leakage of fluid, vaginal bleeding, abnormal vaginal discharge w/ itching/odor/irritation, headaches, visual changes, shortness of breath, chest pain, abdominal pain, severe nausea/vomiting, or problems with urination or bowel movements unless otherwise stated above.  Pertinent History Reviewed:  Reviewed past medical,surgical, social, obstetrical and family history.  Reviewed problem list,  medications and allergies. OB History  Gravida Para Term Preterm AB Living  3 1 1  0 1 1  SAB IAB Ectopic Multiple Live Births  1 0 0 0 1    # Outcome Date GA Lbr Len/2nd Weight Sex Delivery Anes PTL Lv  3 Current           2 SAB 2018          1 Term 06/06/12 [redacted]w[redacted]d 53:01 / 03:15 7 lb 12.7 oz (3.535 kg) M Vag-Spont EPI  LIV     Birth Comments: WNL   Physical Assessment:   Vitals:   04/06/21 1414  BP: 123/84  Pulse: 76  Weight: 169 lb (76.7 kg)  Body mass index is 27.28 kg/m.       Physical Examination:  General appearance - well appearing, and in no distress  Mental status - alert, oriented to person, place, and time  Psych:  She has a normal mood and affect  Skin - warm and dry, normal color, no suspicious lesions noted  Chest - effort normal, all lung fields clear to auscultation bilaterally  Heart - normal rate and regular rhythm  Breast: exam per LHE, feels area of concern is superficial and feels like sebaceous cyst- does not recommend u/s   Abdomen - soft, nontender  Extremities:  No swelling or varicosities noted  Pelvic - VULVA: normal appearing vulva with no masses, tenderness or lesions  VAGINA: normal appearing vagina with normal color and discharge, no  lesions  CERVIX: normal appearing cervix without discharge or lesions, no CMT  Thin prep pap is done w/ reflex HR HPV cotesting  Chaperone: Faith Rogue    TODAY'S NT [place] 12+6 wks,measurements c/w dates,CRL 66.17 mm,NT 1.4 mm,NB present,normal ovaries,anterior placenta gr 0,fhr 159 bpm  Results for orders placed or performed in visit on 04/06/21 (from the past 24 hour(s))  POC Urinalysis Dipstick OB   Collection Time: 04/06/21  2:54 PM  Result Value Ref Range   Color, UA     Clarity, UA     Glucose, UA Negative Negative   Bilirubin, UA     Ketones, UA neg    Spec Grav, UA     Blood, UA neg    pH, UA     POC,PROTEIN,UA Negative Negative, Trace, Small (1+), Moderate (2+), Large (3+), 4+   Urobilinogen, UA      Nitrite, UA neg    Leukocytes, UA Negative Negative   Appearance     Odor      Assessment & Plan:  1) Low-Risk Pregnancy G3P1011 at [redacted]w[redacted]d with an Estimated Date of Delivery: 10/13/21   2) Initial OB visit  3) H/O gHTN> start ASA 162mg , baseline labs today  4) Rt breast lump w/ family h/o mom dx w/ breast cancer @ 40-41yo> exam [facility], feels it is a sebaceous cyst/superficial, does not recommend u/s, pt ok w/ this. To monitor area and let [facility] know if anything changes.  Meds:  Meds ordered this encounter  Medications   aspirin 81 MG EC tablet    Sig: Take 2 tablets (162 mg total) by mouth daily. Swallow whole.    Dispense:  180 tablet    Refill:  2    Order Specific Question:   Supervising Provider    Answer:   [place] H [[id number]]    Initial labs obtained Continue prenatal vitamins Reviewed n/v relief measures and warning s/s to report Reviewed recommended weight gain based on pre-gravid BMI Encouraged well-balanced diet Genetic & carrier screening discussed: requests Panorama and NT/IT, declines Horizon  Ultrasound discussed; fetal survey: requested CCNC completed> form faxed if has or is planning to apply for medicaid The nature of [place] [facility] with multiple MDs and other Advanced Practice Providers was explained to patient; also emphasized that fellows, residents, and students are part of our team. Does have home bp cuff. Office bp cuff given: no. Rx sent: no. Check bp weekly, let [employer] know if consistently >140/90.   Follow-up: Return in about 4 weeks (around 05/04/2021) for LROB, 2nd IT, CNM, in person.   Orders Placed This Encounter  Procedures   Urine Culture   Integrated 1   Protein / creatinine ratio, urine   Pain Management Screening Profile (10S)   Genetic Screening   Comprehensive metabolic panel   CBC/D/Plt+RPR+Rh+ABO+RubIgG...   POC Urinalysis Dipstick OB    05/06/2021 CNM, [facility] 04/06/2021 3:32 PM

## 2021-04-06 NOTE — Progress Notes (Signed)
[place] 12+6 wks,measurements c/w dates,CRL 66.17 mm,NT 1.4 mm,NB present,normal ovaries,anterior placenta gr 0,fhr 159 bpm

## 2021-04-07 ENCOUNTER — Other Ambulatory Visit: Payer: Self-pay

## 2021-04-07 ENCOUNTER — Emergency Department (HOSPITAL_COMMUNITY)
Admission: EM | Admit: 2021-04-07 | Discharge: 2021-04-08 | Disposition: A | Discharge status: Home / Self Care | Payer: PRIVATE HEALTH INSURANCE | Attending: Emergency Medicine | Admitting: Emergency Medicine

## 2021-04-07 ENCOUNTER — Encounter (HOSPITAL_COMMUNITY): Payer: Self-pay

## 2021-04-07 DIAGNOSIS — R112 Nausea with vomiting, unspecified: Secondary | ICD-10-CM | POA: Diagnosis not present

## 2021-04-07 DIAGNOSIS — R1010 Upper abdominal pain, unspecified: Secondary | ICD-10-CM | POA: Insufficient documentation

## 2021-04-07 DIAGNOSIS — Z7982 Long term (current) use of aspirin: Secondary | ICD-10-CM | POA: Insufficient documentation

## 2021-04-07 LAB — CBC
HCT: 40.5 % (ref 36.0–46.0)
Hemoglobin: 14.5 g/dL (ref 12.0–15.0)
MCH: 33.6 pg (ref 26.0–34.0)
MCHC: 35.8 g/dL (ref 30.0–36.0)
MCV: 94 fL (ref 80.0–100.0)
Platelets: 245 10*3/uL (ref 150–400)
RBC: 4.31 MIL/uL (ref 3.87–5.11)
RDW: 12.5 % (ref 11.5–15.5)
WBC: 11.7 10*3/uL — ABNORMAL HIGH (ref 4.0–10.5)
nRBC: 0 % (ref 0.0–0.2)

## 2021-04-07 LAB — COMPREHENSIVE METABOLIC PANEL
ALT: 23 U/L (ref 0–44)
AST: 22 U/L (ref 15–41)
Albumin: 4.2 g/dL (ref 3.5–5.0)
Alkaline Phosphatase: 63 U/L (ref 38–126)
Anion gap: 12 (ref 5–15)
BUN: 9 mg/dL (ref 6–20)
CO2: 21 mmol/L — ABNORMAL LOW (ref 22–32)
Calcium: 9.3 mg/dL (ref 8.9–10.3)
Chloride: 101 mmol/L (ref 98–111)
Creatinine, Ser: 0.49 mg/dL (ref 0.44–1.00)
GFR, Estimated: >60 mL/min (ref >60)
Glucose, Bld: 84 mg/dL (ref 70–99)
Potassium: 3.4 mmol/L — ABNORMAL LOW (ref 3.5–5.1)
Sodium: 134 mmol/L — ABNORMAL LOW (ref 135–145)
Total Bilirubin: 0.8 mg/dL (ref 0.3–1.2)
Total Protein: 8.4 g/dL — ABNORMAL HIGH (ref 6.5–8.1)

## 2021-04-07 LAB — PMP SCREEN PROFILE (10S), URINE
Amphetamine Scrn, Ur: NEGATIVE ng/mL
BARBITURATE SCREEN URINE: NEGATIVE ng/mL
BENZODIAZEPINE SCREEN, URINE: NEGATIVE ng/mL
CANNABINOIDS UR QL SCN: NEGATIVE ng/mL
Cocaine (Metab) Scrn, Ur: NEGATIVE ng/mL
Creatinine(Crt), U: 24.4 mg/dL (ref 20.0–300.0)
Methadone Screen, Urine: NEGATIVE ng/mL
OXYCODONE+OXYMORPHONE UR QL SCN: NEGATIVE ng/mL
Opiate Scrn, Ur: NEGATIVE ng/mL
Ph of Urine: 6.3 (ref 4.5–8.9)
Phencyclidine Qn, Ur: NEGATIVE ng/mL
Propoxyphene Scrn, Ur: NEGATIVE ng/mL

## 2021-04-07 LAB — LIPASE, BLOOD: Lipase: 32 U/L (ref 11–51)

## 2021-04-07 MED ORDER — FAMOTIDINE IN NACL 20-0.9 MG/50ML-% IV SOLN
20.0000 mg | Freq: Once | INTRAVENOUS | Status: AC
  Administered 2021-04-07: 20 mg via INTRAVENOUS
  Filled 2021-04-07: qty 50

## 2021-04-07 MED ORDER — ONDANSETRON HCL 4 MG/2ML IJ SOLN
4.0000 mg | Freq: Once | INTRAMUSCULAR | Status: AC
  Administered 2021-04-07: 4 mg via INTRAVENOUS
  Filled 2021-04-07: qty 2

## 2021-04-07 MED ORDER — SODIUM CHLORIDE 0.9 % IV BOLUS (SEPSIS)
1000.0000 mL | Freq: Once | INTRAVENOUS | Status: AC
  Administered 2021-04-07: 1000 mL via INTRAVENOUS

## 2021-04-07 MED ORDER — SODIUM CHLORIDE 0.9 % IV SOLN
1000.0000 mL | INTRAVENOUS | Status: DC
  Administered 2021-04-08: 1000 mL via INTRAVENOUS

## 2021-04-07 MED ORDER — METOCLOPRAMIDE HCL 10 MG PO TABS
10.0000 mg | ORAL_TABLET | Freq: Once | ORAL | Status: AC
  Administered 2021-04-07: 10 mg via ORAL
  Filled 2021-04-07: qty 1

## 2021-04-07 NOTE — ED Triage Notes (Signed)
Pt presents to Ed from home with c/o vomiting and intermittent epigastric pain. Sx started yesterday, unable to keep anything down, pt is [redacted] weeks pregnant.

## 2021-04-07 NOTE — ED Provider Notes (Signed)
[facility] EMERGENCY DEPARTMENT Provider Note   CSN: [id number] Arrival date & time: 04/07/21  2115     History Chief Complaint  Patient presents with   Emesis    [name] is a 30 y.o. female.   Emesis  Patient is G3, P1 at [redacted] weeks EGA who presents with nausea vomiting and upper abdominal pain.  Patient states she has had nausea and vomiting since yesterday.  She has had a cramping discomfort primarily in her upper abdomen.  She has had multiple episodes of vomiting and is now unable to keep anything down.  She denies any diarrhea.  No fevers.  No vaginal bleeding.  No urinary symptoms.  Patient called the OB/GYN doctor and was instructed come to the ED for further evaluation.  Past Medical History:  Diagnosis Date   Gestational hypertension, antepartum 06/06/2012   No pertinent past medical history    PONV (postoperative nausea and vomiting)    Pyelonephritis    Urinary tract infection     Patient Active Problem List   Diagnosis Date Noted   History of gestational hypertension 04/06/2021   Encounter for supervision of normal pregnancy, antepartum 04/06/2021   Family history of breast cancer in mother 04/06/2021    Past Surgical History:  Procedure Laterality Date   Bladder stem stretcher     DILATION AND EVACUATION N/A 03/30/2016   Procedure: DILATATION AND EVACUATION--suction D&C;  Surgeon: Lazaro Arms, MD;  Location: [facility];  Service: Gynecology;  Laterality: N/A;   GALLBLADDER SURGERY     TONSILLECTOMY       OB History     Gravida  3   Para  1   Term  1   Preterm  0   AB  1   Living  1      SAB  1   IAB  0   Ectopic  0   Multiple  0   Live Births  1           Family History  Problem Relation Age of Onset   Breast cancer Mother    Leukemia Paternal Grandfather    Other Neg Hx     Social History   Tobacco Use   Smoking status: Never   Smokeless tobacco: Never  Vaping Use   Vaping Use: Never used  Substance Use  Topics   Alcohol use: No   Drug use: No    Home Medications Prior to Admission medications   Medication Sig Start Date End Date Taking? Authorizing Provider  ondansetron (ZOFRAN ODT) 4 MG disintegrating tablet 4mg  ODT q4 hours prn nausea/vomit 04/08/21  Yes Mesner, 04/10/21, MD  promethazine (PHENERGAN) 25 MG tablet Take 1 tablet (25 mg total) by mouth every 6 (six) hours as needed for nausea or vomiting. 04/08/21  Yes Mesner, 04/10/21, MD  aspirin 81 MG EC tablet Take 2 tablets (162 mg total) by mouth daily. Swallow whole. 04/06/21   04/08/21, CNM  doxylamine, Sleep, (UNISOM) 25 MG tablet Take 25 mg by mouth at bedtime as needed.    [provider]  Prenatal MV & Min w/FA-DHA (PRENATAL ADULT GUMMY/DHA/FA PO) Take by mouth. Takes 2 daily    [provider]  pyridOXINE (VITAMIN B-6) 100 MG tablet Take 100 mg by mouth 2 (two) times daily.    [provider]    Allergies    Patient has no known allergies.  Review of Systems   Review of Systems  Gastrointestinal:  Positive for vomiting.  All other systems reviewed and are negative.  Physical Exam Updated Vital Signs BP 113/75 (BP Location: Left Arm)   Pulse 90   Temp 98 F (36.7 C) (Oral)   Resp 18   Ht 1.676 m (5\' 6" )   Wt 73 kg   LMP 01/13/2021   SpO2 100%   BMI 25.99 kg/m   Physical Exam Vitals and nursing note reviewed.  Constitutional:      General: She is not in acute distress.    Appearance: She is well-developed. She is not diaphoretic.  HENT:     Head: Normocephalic and atraumatic.     Right Ear: External ear normal.     Left Ear: External ear normal.  Eyes:     General: No scleral icterus.       Right eye: No discharge.        Left eye: No discharge.     Conjunctiva/sclera: Conjunctivae normal.  Neck:     Trachea: No tracheal deviation.  Cardiovascular:     Rate and Rhythm: Normal rate and regular rhythm.  Pulmonary:     Effort: Pulmonary effort is normal. No respiratory  distress.     Breath sounds: Normal breath sounds. No stridor. No wheezing or rales.  Abdominal:     General: Bowel sounds are normal. There is no distension.     Palpations: Abdomen is soft.     Tenderness: There is no abdominal tenderness. There is no guarding or rebound.  Musculoskeletal:        General: No tenderness or deformity.     Cervical back: Neck supple.  Skin:    General: Skin is warm and dry.     Findings: No rash.  Neurological:     General: No focal deficit present.     Mental Status: She is alert.     Cranial Nerves: No cranial nerve deficit (no facial droop, extraocular movements intact, no slurred speech).     Sensory: No sensory deficit.     Motor: No abnormal muscle tone or seizure activity.     Coordination: Coordination normal.  Psychiatric:        Mood and Affect: Mood normal.    ED Results / Procedures / Treatments   Labs (all labs ordered are listed, but only abnormal results are displayed) Labs Reviewed  COMPREHENSIVE METABOLIC PANEL - Abnormal; Notable for the following components:      Result Value   Sodium 134 (*)    Potassium 3.4 (*)    CO2 21 (*)    Total Protein 8.4 (*)    All other components within normal limits  CBC - Abnormal; Notable for the following components:   WBC 11.7 (*)    All other components within normal limits  URINALYSIS, ROUTINE W REFLEX MICROSCOPIC - Abnormal; Notable for the following components:   APPearance HAZY (*)    Ketones, ur 80 (*)    Protein, ur 30 (*)    Bacteria, UA RARE (*)    All other components within normal limits  LIPASE, BLOOD    EKG None  Radiology No results found.  Procedures Procedures   Medications Ordered in ED Medications  ondansetron (ZOFRAN) injection 4 mg (4 mg Intravenous Given 04/07/21 2307)  sodium chloride 0.9 % bolus 1,000 mL (0 mLs Intravenous Stopped 04/08/21 0316)    Followed by  sodium chloride 0.9 % bolus 1,000 mL (0 mLs Intravenous Stopped 04/08/21 0316)  famotidine  (PEPCID) IVPB 20 mg premix (  0 mg Intravenous Stopped 04/08/21 0316)  metoCLOPramide (REGLAN) tablet 10 mg (10 mg Oral Given 04/07/21 2309)  ondansetron (ZOFRAN) injection 4 mg (4 mg Intravenous Given 04/08/21 0025)  acetaminophen (TYLENOL) tablet 1,000 mg (1,000 mg Oral Given 04/08/21 0314)  prochlorperazine (COMPAZINE) injection 10 mg (10 mg Intravenous Given 04/08/21 0318)    ED Course  I have reviewed the triage vital signs and the nursing notes.  Pertinent labs & imaging results that were available during my care of the patient were reviewed by me and considered in my medical decision making (see chart for details).    MDM Rules/Calculators/A&P                           Pt presented with nausea and vomiting, early pregnancy.  NO complaints of vaginal bleeding.  Abd benign.  DDX hyperemesis, pancreatitis, hepatitis.  Labs IV fluids ordered.  Improved after initial response to medications.  Anticipate dc home. Care turned over to Dr Erin Hearing to reassess. Final Clinical Impression(s) / ED Diagnoses Final diagnoses:  Non-intractable vomiting with nausea, unspecified vomiting type    Rx / DC Orders ED Discharge Orders          Ordered    ondansetron (ZOFRAN ODT) 4 MG disintegrating tablet        04/08/21 0338    promethazine (PHENERGAN) 25 MG tablet  Every 6 hours PRN        04/08/21 6979             Linwood Dibbles, MD 04/09/21 0900

## 2021-04-08 ENCOUNTER — Telehealth: Payer: Self-pay | Admitting: Women's Health

## 2021-04-08 DIAGNOSIS — R112 Nausea with vomiting, unspecified: Secondary | ICD-10-CM | POA: Diagnosis not present

## 2021-04-08 LAB — CYTOLOGY - PAP
Chlamydia: NEGATIVE
Comment: NEGATIVE
Comment: NEGATIVE
Comment: NORMAL
Diagnosis: NEGATIVE
High risk HPV: NEGATIVE
Neisseria Gonorrhea: NEGATIVE

## 2021-04-08 LAB — COMPREHENSIVE METABOLIC PANEL
ALT: 13 IU/L (ref 0–32)
AST: 16 IU/L (ref 0–40)
Albumin/Globulin Ratio: 1.6 (ref 1.2–2.2)
Albumin: 4.4 g/dL (ref 3.9–5.0)
Alkaline Phosphatase: 68 IU/L (ref 44–121)
BUN/Creatinine Ratio: 11 (ref 9–23)
BUN: 6 mg/dL (ref 6–20)
Bilirubin Total: 0.2 mg/dL (ref 0.0–1.2)
CO2: 20 mmol/L (ref 20–29)
Calcium: 9.6 mg/dL (ref 8.7–10.2)
Chloride: 97 mmol/L (ref 96–106)
Creatinine, Ser: 0.53 mg/dL — ABNORMAL LOW (ref 0.57–1.00)
Globulin, Total: 2.7 g/dL (ref 1.5–4.5)
Glucose: 83 mg/dL (ref 70–99)
Potassium: 3.8 mmol/L (ref 3.5–5.2)
Sodium: 135 mmol/L (ref 134–144)
Total Protein: 7.1 g/dL (ref 6.0–8.5)
eGFR: 128 mL/min/{1.73_m2} (ref >59)

## 2021-04-08 LAB — HCV INTERPRETATION

## 2021-04-08 LAB — CBC/D/PLT+RPR+RH+ABO+RUBIGG...
Antibody Screen: NEGATIVE
Basophils Absolute: 0 10*3/uL (ref 0.0–0.2)
Basos: 0 %
EOS (ABSOLUTE): 0.1 10*3/uL (ref 0.0–0.4)
Eos: 1 %
HCV Ab: <0.1 s/co ratio (ref 0.0–0.9)
HIV Screen 4th Generation wRfx: NONREACTIVE
Hematocrit: 39.7 % (ref 34.0–46.6)
Hemoglobin: 14 g/dL (ref 11.1–15.9)
Hepatitis B Surface Ag: NEGATIVE
Immature Grans (Abs): 0 10*3/uL (ref 0.0–0.1)
Immature Granulocytes: 0 %
Lymphocytes Absolute: 2 10*3/uL (ref 0.7–3.1)
Lymphs: 22 %
MCH: 32.6 pg (ref 26.6–33.0)
MCHC: 35.3 g/dL (ref 31.5–35.7)
MCV: 93 fL (ref 79–97)
Monocytes Absolute: 0.5 10*3/uL (ref 0.1–0.9)
Monocytes: 5 %
Neutrophils Absolute: 6.5 10*3/uL (ref 1.4–7.0)
Neutrophils: 72 %
Platelets: 241 10*3/uL (ref 150–450)
RBC: 4.29 x10E6/uL (ref 3.77–5.28)
RDW: 12.7 % (ref 11.7–15.4)
RPR Ser Ql: NONREACTIVE
Rh Factor: POSITIVE
Rubella Antibodies, IGG: 3.35 index (ref >0.99)
WBC: 9.1 10*3/uL (ref 3.4–10.8)

## 2021-04-08 LAB — URINALYSIS, ROUTINE W REFLEX MICROSCOPIC
Bilirubin Urine: NEGATIVE
Glucose, UA: NEGATIVE mg/dL
Hgb urine dipstick: NEGATIVE
Ketones, ur: 80 mg/dL — AB
Leukocytes,Ua: NEGATIVE
Nitrite: NEGATIVE
Protein, ur: 30 mg/dL — AB
Specific Gravity, Urine: 1.029 (ref 1.005–1.030)
pH: 5 (ref 5.0–8.0)

## 2021-04-08 LAB — INTEGRATED 1
Crown Rump Length: 66.2 mm
Gest. Age on Collection Date: 12.7 weeks
Maternal Age at EDD: 30.1 yr
Nuchal Translucency (NT): 1.4 mm
Number of Fetuses: 1
PAPP-A Value: 1570.7 ng/mL
Weight: 169 [lb_av]

## 2021-04-08 LAB — PROTEIN / CREATININE RATIO, URINE
Creatinine, Urine: 21.3 mg/dL
Protein, Ur: <4 mg/dL

## 2021-04-08 MED ORDER — ACETAMINOPHEN 500 MG PO TABS
1000.0000 mg | ORAL_TABLET | Freq: Once | ORAL | Status: AC
  Administered 2021-04-08: 1000 mg via ORAL
  Filled 2021-04-08: qty 2

## 2021-04-08 MED ORDER — DOXYLAMINE SUCCINATE (SLEEP) 25 MG PO TABS
25.0000 mg | ORAL_TABLET | Freq: Every evening | ORAL | Status: DC | PRN
  Filled 2021-04-08: qty 1

## 2021-04-08 MED ORDER — ONDANSETRON HCL 4 MG/2ML IJ SOLN
4.0000 mg | Freq: Once | INTRAMUSCULAR | Status: AC
  Administered 2021-04-08: 4 mg via INTRAVENOUS
  Filled 2021-04-08: qty 2

## 2021-04-08 MED ORDER — PYRIDOXINE HCL 25 MG PO TABS
25.0000 mg | ORAL_TABLET | Freq: Every day | ORAL | Status: DC
  Filled 2021-04-08: qty 1

## 2021-04-08 MED ORDER — PROMETHAZINE HCL 25 MG PO TABS: 25.0000 mg | ORAL_TABLET | Freq: Four times a day (QID) | ORAL | 0 refills | Status: DC | PRN

## 2021-04-08 MED ORDER — ONDANSETRON 4 MG PO TBDP: ORAL_TABLET | ORAL | 0 refills | Status: DC

## 2021-04-08 MED ORDER — PROCHLORPERAZINE EDISYLATE 10 MG/2ML IJ SOLN
10.0000 mg | Freq: Once | INTRAMUSCULAR | Status: AC
  Administered 2021-04-08: 10 mg via INTRAVENOUS
  Filled 2021-04-08: qty 2

## 2021-04-08 NOTE — Telephone Encounter (Signed)
Pt was seen at [facility] ED 9/27 & they advised her to let us know Please review ED note & advise

## 2021-04-08 NOTE — ED Notes (Signed)
Pt still nauseous. EDP notified.

## 2021-04-08 NOTE — ED Provider Notes (Signed)
5:14 AM Assumed care from Dr. Lynelle Doctor, please see their note for full history, physical and decision making until this point. In brief this is a 30 y.o. year old female who presented to the ED tonight with Emesis     [redacted] weeks pregnant here with hyperemesis. Improving but still nauseous, pending urine.   After more meds patient states she feels well enough to go home. Urine ok. Has some meds. Will fu w/ ob, return here if worsening.   Discharge instructions, including strict return precautions for new or worsening symptoms, given. Patient and/or family verbalized understanding and agreement with the plan as described.   Labs, studies and imaging reviewed by myself and considered in medical decision making if ordered. Imaging interpreted by radiology.  Labs Reviewed  COMPREHENSIVE METABOLIC PANEL - Abnormal; Notable for the following components:      Result Value   Sodium 134 (*)    Potassium 3.4 (*)    CO2 21 (*)    Total Protein 8.4 (*)    All other components within normal limits  CBC - Abnormal; Notable for the following components:   WBC 11.7 (*)    All other components within normal limits  URINALYSIS, ROUTINE W REFLEX MICROSCOPIC - Abnormal; Notable for the following components:   APPearance HAZY (*)    Ketones, ur 80 (*)    Protein, ur 30 (*)    Bacteria, UA RARE (*)    All other components within normal limits  LIPASE, BLOOD    No orders to display    No follow-ups on file.    Tomasita Beevers, Barbara Cower, MD 04/08/21 [phone]

## 2021-04-09 LAB — URINE CULTURE: Organism ID, Bacteria: NO GROWTH

## 2021-04-15 ENCOUNTER — Encounter: Payer: Self-pay | Admitting: Women's Health

## 2021-05-05 ENCOUNTER — Encounter: Payer: Self-pay | Admitting: Women's Health

## 2021-05-05 ENCOUNTER — Other Ambulatory Visit: Payer: Self-pay

## 2021-05-05 ENCOUNTER — Ambulatory Visit (INDEPENDENT_AMBULATORY_CARE_PROVIDER_SITE_OTHER): Payer: PRIVATE HEALTH INSURANCE | Admitting: Women's Health

## 2021-05-05 VITALS — BP 100/71 | HR 77 | Wt 174.5 lb

## 2021-05-05 DIAGNOSIS — Z3A17 17 weeks gestation of pregnancy: Secondary | ICD-10-CM

## 2021-05-05 DIAGNOSIS — Z3482 Encounter for supervision of other normal pregnancy, second trimester: Secondary | ICD-10-CM

## 2021-05-05 DIAGNOSIS — Z363 Encounter for antenatal screening for malformations: Secondary | ICD-10-CM

## 2021-05-05 DIAGNOSIS — Z1379 Encounter for other screening for genetic and chromosomal anomalies: Secondary | ICD-10-CM

## 2021-05-05 DIAGNOSIS — Z23 Encounter for immunization: Secondary | ICD-10-CM | POA: Diagnosis not present

## 2021-05-05 DIAGNOSIS — Z3480 Encounter for supervision of other normal pregnancy, unspecified trimester: Secondary | ICD-10-CM

## 2021-05-05 NOTE — Progress Notes (Signed)
    LOW-RISK PREGNANCY VISIT Patient name: [name] MRN [id number]  Date of birth: 1991-03-07 Chief Complaint:   Routine Prenatal Visit (2nd IT)  History of Present Illness:   [name] is a 30 y.o. G30P1011 female at [redacted]w[redacted]d with an Estimated Date of Delivery: 10/13/21 being seen today for ongoing management of a low-risk pregnancy.   Today she reports  some occasional lower abd pressure/cramping . Contractions: Not present. Vag. Bleeding: None.  Movement: Absent. denies leaking of fluid.  Depression screen [facility] 2/9 04/06/2021  Decreased Interest 0  Down, Depressed, Hopeless 0  PHQ - 2 Score 0  Altered sleeping 0  Tired, decreased energy 0  Change in appetite 0  Feeling bad or failure about yourself  0  Trouble concentrating 0  Moving slowly or fidgety/restless 0  Suicidal thoughts 0  PHQ-9 Score 0     GAD 7 : Generalized Anxiety Score 04/06/2021  Nervous, Anxious, on Edge 0  Control/stop worrying 0  Worry too much - different things 0  Trouble relaxing 0  Restless 0  Easily annoyed or irritable 0  Afraid - awful might happen 0  Total GAD 7 Score 0      Review of Systems:   Pertinent items are noted in HPI Denies abnormal vaginal discharge w/ itching/odor/irritation, headaches, visual changes, shortness of breath, chest pain, abdominal pain, severe nausea/vomiting, or problems with urination or bowel movements unless otherwise stated above. Pertinent History Reviewed:  Reviewed past medical,surgical, social, obstetrical and family history.  Reviewed problem list, medications and allergies. Physical Assessment:   Vitals:   05/05/21 1547  BP: 100/71  Pulse: 77  Weight: 174 lb 8 oz (79.2 kg)  Body mass index is 28.17 kg/m.        Physical Examination:   General appearance: Well appearing, and in no distress  Mental status: Alert, oriented to person, place, and time  Skin: Warm & dry  Cardiovascular: Normal heart rate noted  Respiratory: Normal respiratory  effort, no distress  Abdomen: Soft, gravid, nontender  Pelvic: Cervical exam deferred         Extremities: Edema: Trace  Fetal Status: Fetal Heart Rate (bpm): 147   Movement: Absent    Chaperone: N/A   No results found for this or any previous visit (from the past 24 hour(s)).  Assessment & Plan:  1) Low-risk pregnancy G3P1011 at [redacted]w[redacted]d with an Estimated Date of Delivery: 10/13/21   2) H/O GHTN, ASA   Meds: No orders of the defined types were placed in this encounter.  Labs/procedures today: 2nd IT  Plan:  Continue routine obstetrical care  Next visit: prefers will be in person for u/s     Reviewed: Preterm labor symptoms and general obstetric precautions including but not limited to vaginal bleeding, contractions, leaking of fluid and fetal movement were reviewed in detail with the patient.  All questions were answered. Does have home bp cuff. Office bp cuff given: not applicable. Check bp weekly, let us know if consistently >140 and/or >90.  Follow-up: Return in about 2 weeks (around 05/19/2021) for LROB, NA:TFTDDUK, CNM, in person.  No future appointments.  Orders Placed This Encounter  Procedures   US OB Comp + 14 Wk   Flu Vaccine QUAD 69mo+IM (Fluarix, Fluzone & Alfiuria Quad PF)   INTEGRATED 2   Cheral Marker CNM, [facility] 05/05/2021 4:13 PM

## 2021-05-05 NOTE — Patient Instructions (Signed)
[name], thank you for choosing our office today! We appreciate the opportunity to meet your healthcare needs. You may receive a short survey by mail, e-mail, or through MyChart. If you are happy with your care we would appreciate if you could take just a few minutes to complete the survey questions. We read all of your comments and take your feedback very seriously. Thank you again for choosing our office.  [facility] Team at [facility] ([address] [place], [place] [address]) Entrance C, located off of [address] Free 24/7 valet parking  Go to [email or web address] to register for FREE online childbirth classes  Call the office ([phone]) or go to [facility] if: You begin to severe cramping Your water breaks.  Sometimes it is a big gush of fluid, sometimes it is just a trickle that keeps getting your panties wet or running down your legs You have vaginal bleeding.  It is normal to have a small amount of spotting if your cervix was checked.   [place] [facility] ([facility]): [address], [phone]           [facility]: [address], [phone]                [facility] ([facility]): [address], [phone] (call to ask if accepting patients) [facility]: [address], [place], [phone]    [place] [facility] ([facility]): [address], [phone] [facility]: [address], [phone] [facility]: [address], [phone]  [place] [facility]  [facility] ([facility]): [phone] [facility]: [address], [phone]   [place] [facility]: [address], [phone]  [facility]  [facility]: [address], [phone]  Home Blood Pressure Monitoring for Patients   Your provider has recommended that you check your blood pressure (BP) at least once a week at home. If you do not have a blood pressure cuff at home, one will be provided for you. Contact your provider if you have not received your monitor within 1 week.   Helpful Tips for Accurate Home Blood Pressure Checks  Don't smoke, exercise, or drink caffeine 30 minutes before checking your BP Use the restroom before checking your BP (a full bladder can raise your pressure) Relax in a comfortable upright chair Feet on the ground Left arm resting comfortably on a flat surface at the level of your heart Legs uncrossed Back supported Sit quietly and don't talk Place the cuff on your bare arm Adjust snuggly, so that only two fingertips can fit between your skin and the top of the cuff Check 2 readings separated by at least one minute Keep a log of your BP readings For a visual, please reference this diagram: http://ccnc.care/bpdiagram  Provider Name: [facility]     Phone: [phone]  Zone 1: ALL CLEAR  Continue to monitor your symptoms:  BP reading is less than 140 (top number) or less than 90 (bottom number)  No right upper stomach pain No headaches or seeing spots No feeling nauseated or throwing up No swelling in face and hands  Zone 2: CAUTION Call your doctor's office for any of the following:  BP reading is greater than 140 (top number) or greater than   90 (bottom number)  Stomach pain under your ribs in the middle or right side Headaches or seeing spots Feeling nauseated or throwing up Swelling in face and hands  Zone 3: EMERGENCY  Seek immediate medical care if you have any of the following:  BP reading is greater than160 (top number) or greater than 110 (bottom number) Severe headaches not improving with Tylenol Serious difficulty catching your breath Any worsening symptoms from  Zone 2     Second Trimester of Pregnancy The second trimester is from week 14 through week 27 (months 4 through 6). The second trimester is often a time when you feel your best. Your body has adjusted to being pregnant, and you begin to feel better physically. Usually, morning sickness has lessened or quit completely, you may have more energy, and you may have an increase in appetite. The second trimester is also a time when the fetus is growing rapidly. At the end of the sixth month, the fetus is about 9 inches long and weighs about 1 pounds. You will likely begin to feel the baby move (quickening) between 16 and 20 weeks of pregnancy. Body changes during your second trimester Your body continues to go through many changes during your second trimester. The changes vary from woman to woman. Your weight will continue to increase. You will notice your lower abdomen bulging out. You may begin to get stretch marks on your hips, abdomen, and breasts. You may develop headaches that can be relieved by medicines. The medicines should be approved by your health care provider. You may urinate more often because the fetus is pressing on your bladder. You may develop or continue to have heartburn as a result of your pregnancy. You may develop constipation because certain hormones are causing the muscles that push waste through your intestines to slow down. You may develop hemorrhoids or swollen, bulging veins (varicose veins). You may have back pain. This is caused by: Weight gain. Pregnancy hormones that are relaxing the joints in your pelvis. A shift in weight and the muscles that support your balance. Your breasts will continue to grow and they will continue to become tender. Your gums may bleed and may be sensitive to brushing and flossing. Dark spots or blotches (chloasma, mask of pregnancy) may develop on your face. This will likely fade after the baby is born. A dark line from your belly button to  the pubic area (linea nigra) may appear. This will likely fade after the baby is born. You may have changes in your hair. These can include thickening of your hair, rapid growth, and changes in texture. Some women also have hair loss during or after pregnancy, or hair that feels dry or thin. Your hair will most likely return to normal after your baby is born.  What to expect at prenatal visits During a routine prenatal visit: You will be weighed to make sure you and the fetus are growing normally. Your blood pressure will be taken. Your abdomen will be measured to track your baby's growth. The fetal heartbeat will be listened to. Any test results from the previous visit will be discussed.  Your health care provider may ask you: How you are feeling. If you are feeling the baby move. If you have had any abnormal symptoms, such as leaking fluid, bleeding, severe headaches, or abdominal cramping. If you are using any tobacco products, including cigarettes, chewing tobacco, and electronic cigarettes. If you have any questions.  Other tests that may be performed during   your second trimester include: Blood tests that check for: Low iron levels (anemia). High blood sugar that affects pregnant women (gestational diabetes) between 24 and 28 weeks. Rh antibodies. This is to check for a protein on red blood cells (Rh factor). Urine tests to check for infections, diabetes, or protein in the urine. An ultrasound to confirm the proper growth and development of the baby. An amniocentesis to check for possible genetic problems. Fetal screens for spina bifida and Down syndrome. HIV (human immunodeficiency virus) testing. Routine prenatal testing includes screening for HIV, unless you choose not to have this test.  Follow these instructions at home: Medicines Follow your health care provider's instructions regarding medicine use. Specific medicines may be either safe or unsafe to take during  pregnancy. Take a prenatal vitamin that contains at least 600 micrograms (mcg) of folic acid. If you develop constipation, try taking a stool softener if your health care provider approves. Eating and drinking Eat a balanced diet that includes fresh fruits and vegetables, whole grains, good sources of protein such as meat, eggs, or tofu, and low-fat dairy. Your health care provider will help you determine the amount of weight gain that is right for you. Avoid raw meat and uncooked cheese. These carry germs that can cause birth defects in the baby. If you have low calcium intake from food, talk to your health care provider about whether you should take a daily calcium supplement. Limit foods that are high in fat and processed sugars, such as fried and sweet foods. To prevent constipation: Drink enough fluid to keep your urine clear or pale yellow. Eat foods that are high in fiber, such as fresh fruits and vegetables, whole grains, and beans. Activity Exercise only as directed by your health care provider. Most women can continue their usual exercise routine during pregnancy. Try to exercise for 30 minutes at least 5 days a week. Stop exercising if you experience uterine contractions. Avoid heavy lifting, wear low heel shoes, and practice good posture. A sexual relationship may be continued unless your health care provider directs you otherwise. Relieving pain and discomfort Wear a good support bra to prevent discomfort from breast tenderness. Take warm sitz baths to soothe any pain or discomfort caused by hemorrhoids. Use hemorrhoid cream if your health care provider approves. Rest with your legs elevated if you have leg cramps or low back pain. If you develop varicose veins, wear support hose. Elevate your feet for 15 minutes, 3-4 times a day. Limit salt in your diet. Prenatal Care Write down your questions. Take them to your prenatal visits. Keep all your prenatal visits as told by your health  care provider. This is important. Safety Wear your seat belt at all times when driving. Make a list of emergency phone numbers, including numbers for family, friends, the hospital, and police and fire departments. General instructions Ask your health care provider for a referral to a local prenatal education class. Begin classes no later than the beginning of month 6 of your pregnancy. Ask for help if you have counseling or nutritional needs during pregnancy. Your health care provider can offer advice or refer you to specialists for help with various needs. Do not use hot tubs, steam rooms, or saunas. Do not douche or use tampons or scented sanitary pads. Do not cross your legs for long periods of time. Avoid cat litter boxes and soil used by cats. These carry germs that can cause birth defects in the baby and possibly loss of the   fetus by miscarriage or stillbirth. Avoid all smoking, herbs, alcohol, and unprescribed drugs. Chemicals in these products can affect the formation and growth of the baby. Do not use any products that contain nicotine or tobacco, such as cigarettes and e-cigarettes. If you need help quitting, ask your health care provider. Visit your dentist if you have not gone yet during your pregnancy. Use a soft toothbrush to brush your teeth and be gentle when you floss. Contact a health care provider if: You have dizziness. You have mild pelvic cramps, pelvic pressure, or nagging pain in the abdominal area. You have persistent nausea, vomiting, or diarrhea. You have a bad smelling vaginal discharge. You have pain when you urinate. Get help right away if: You have a fever. You are leaking fluid from your vagina. You have spotting or bleeding from your vagina. You have severe abdominal cramping or pain. You have rapid weight gain or weight loss. You have shortness of breath with chest pain. You notice sudden or extreme swelling of your face, hands, ankles, feet, or legs. You  have not felt your baby move in over an hour. You have severe headaches that do not go away when you take medicine. You have vision changes. Summary The second trimester is from week 14 through week 27 (months 4 through 6). It is also a time when the fetus is growing rapidly. Your body goes through many changes during pregnancy. The changes vary from woman to woman. Avoid all smoking, herbs, alcohol, and unprescribed drugs. These chemicals affect the formation and growth your baby. Do not use any tobacco products, such as cigarettes, chewing tobacco, and e-cigarettes. If you need help quitting, ask your health care provider. Contact your health care provider if you have any questions. Keep all prenatal visits as told by your health care provider. This is important. This information is not intended to replace advice given to you by your health care provider. Make sure you discuss any questions you have with your health care provider. Document Released: 06/22/2001 Document Revised: 12/04/2015 Document Reviewed: 08/29/2012 Elsevier Interactive Patient Education  2017 Elsevier Inc.  

## 2021-05-07 LAB — INTEGRATED 2
AFP MoM: 1.01
Alpha-Fetoprotein: 33.7 ng/mL
Crown Rump Length: 66.2 mm
DIA MoM: 0.86
DIA Value: 122.3 pg/mL
Estriol, Unconjugated: 1.34 ng/mL
Gest. Age on Collection Date: 12.7 weeks
Gestational Age: 16.9 weeks
Maternal Age at EDD: 30.1 yr
Nuchal Translucency (NT): 1.4 mm
Nuchal Translucency MoM: 0.94
Number of Fetuses: 1
PAPP-A MoM: 1.74
PAPP-A Value: 1570.7 ng/mL
Test Results:: NEGATIVE
Weight: 169 [lb_av]
Weight: 175 [lb_av]
hCG MoM: 1.27
hCG Value: 37.5 IU/mL
uE3 MoM: 1.22

## 2021-05-18 ENCOUNTER — Ambulatory Visit (INDEPENDENT_AMBULATORY_CARE_PROVIDER_SITE_OTHER): Payer: PRIVATE HEALTH INSURANCE | Admitting: Obstetrics & Gynecology

## 2021-05-18 ENCOUNTER — Ambulatory Visit (INDEPENDENT_AMBULATORY_CARE_PROVIDER_SITE_OTHER): Payer: PRIVATE HEALTH INSURANCE

## 2021-05-18 ENCOUNTER — Other Ambulatory Visit: Payer: Self-pay

## 2021-05-18 ENCOUNTER — Encounter: Payer: Self-pay | Admitting: Obstetrics & Gynecology

## 2021-05-18 VITALS — BP 104/70 | HR 83 | Wt 171.0 lb

## 2021-05-18 DIAGNOSIS — Z363 Encounter for antenatal screening for malformations: Secondary | ICD-10-CM

## 2021-05-18 DIAGNOSIS — Z3482 Encounter for supervision of other normal pregnancy, second trimester: Secondary | ICD-10-CM | POA: Diagnosis not present

## 2021-05-18 DIAGNOSIS — Z3480 Encounter for supervision of other normal pregnancy, unspecified trimester: Secondary | ICD-10-CM

## 2021-05-18 DIAGNOSIS — Z3A18 18 weeks gestation of pregnancy: Secondary | ICD-10-CM

## 2021-05-18 NOTE — Progress Notes (Signed)
   LOW-RISK PREGNANCY VISIT Patient name: [name] MRN [id number]  Date of birth: 1991-06-07 Chief Complaint:   Routine Prenatal Visit  History of Present Illness:   [name] is a 30 y.o. G51P1011 female at [redacted]w[redacted]d with an Estimated Date of Delivery: 10/13/21 being seen today for ongoing management of a low-risk pregnancy.  Depression screen [facility] 2/9 04/06/2021  Decreased Interest 0  Down, Depressed, Hopeless 0  PHQ - 2 Score 0  Altered sleeping 0  Tired, decreased energy 0  Change in appetite 0  Feeling bad or failure about yourself  0  Trouble concentrating 0  Moving slowly or fidgety/restless 0  Suicidal thoughts 0  PHQ-9 Score 0    Today she reports no complaints. Contractions: Not present. Vag. Bleeding: None.  Movement: Absent. denies leaking of fluid. Review of Systems:   Pertinent items are noted in HPI Denies abnormal vaginal discharge w/ itching/odor/irritation, headaches, visual changes, shortness of breath, chest pain, abdominal pain, severe nausea/vomiting, or problems with urination or bowel movements unless otherwise stated above. Pertinent History Reviewed:  Reviewed past medical,surgical, social, obstetrical and family history.  Reviewed problem list, medications and allergies. Physical Assessment:   Vitals:   05/18/21 1151  BP: 104/70  Pulse: 83  Weight: 171 lb (77.6 kg)  Body mass index is 27.6 kg/m.        Physical Examination:   General appearance: Well appearing, and in no distress  Mental status: Alert, oriented to person, place, and time  Skin: Warm & dry  Cardiovascular: Normal heart rate noted  Respiratory: Normal respiratory effort, no distress  Abdomen: Soft, gravid, nontender  Pelvic: Cervical exam deferred         Extremities: Edema: Trace  Fetal Status:     Movement: Absent    Chaperone: n/a    No results found for this or any previous visit (from the past 24 hour(s)).  Assessment & Plan:  1) Low-risk pregnancy G3P1011 at  [redacted]w[redacted]d with an Estimated Date of Delivery: 10/13/21   2) LV EICF, normal otherwise,   3)Ringworm, use OTC anti fungals   Meds: No orders of the defined types were placed in this encounter.  Labs/procedures today: sonogram  Plan:  Continue routine obstetrical care  Next visit: prefers in person    Reviewed: Preterm labor symptoms and general obstetric precautions including but not limited to vaginal bleeding, contractions, leaking of fluid and fetal movement were reviewed in detail with the patient.  All questions were answered. HAS home bp cuff. Rx faxed to . Check bp weekly, let us know if >140/90.   Follow-up: Return in about 4 weeks (around 06/15/2021) for LROB.  No orders of the defined types were placed in this encounter.   Lazaro Arms, MD 05/18/2021 12:03 PM

## 2021-05-18 NOTE — Progress Notes (Signed)
[place] 18+6 wks,breech,anterior placenta,cx  4 cm,LVEICF 2.2 mm,FHR 159 bpm,SVP of fluid 3.2 cm,normal ovaries,EFW 273 g 58%,anatomy complete

## 2021-05-21 ENCOUNTER — Other Ambulatory Visit: Payer: Self-pay

## 2021-05-21 ENCOUNTER — Other Ambulatory Visit (HOSPITAL_COMMUNITY)
Admission: RE | Admit: 2021-05-21 | Discharge: 2021-05-21 | Disposition: A | Discharge status: Home / Self Care | Payer: PRIVATE HEALTH INSURANCE | Source: Ambulatory Visit | Attending: Obstetrics & Gynecology | Admitting: Obstetrics & Gynecology

## 2021-05-21 ENCOUNTER — Ambulatory Visit (INDEPENDENT_AMBULATORY_CARE_PROVIDER_SITE_OTHER): Payer: PRIVATE HEALTH INSURANCE | Admitting: Obstetrics & Gynecology

## 2021-05-21 ENCOUNTER — Encounter: Payer: Self-pay | Admitting: Obstetrics & Gynecology

## 2021-05-21 VITALS — BP 116/75 | HR 101 | Wt 175.2 lb

## 2021-05-21 DIAGNOSIS — N9089 Other specified noninflammatory disorders of vulva and perineum: Secondary | ICD-10-CM | POA: Diagnosis present

## 2021-05-21 MED ORDER — TRIAMCINOLONE ACETONIDE 0.5 % EX OINT: 1.0000 "application " | TOPICAL_OINTMENT | Freq: Two times a day (BID) | CUTANEOUS | 0 refills | Status: DC

## 2021-05-21 NOTE — Progress Notes (Signed)
   GYN VISIT Patient name: [name] MRN [id number]  Date of birth: 1991-06-07 Chief Complaint:   (Vaginal redness and sore)  History of Present Illness:   [name] is a 30 y.o. G48P1011 pregnant female being seen today for vulvar irritation.   Started yesterday after work, noted irritation, burning and pain.  Tried OTC diaper ointment, but seemed to make things worse.  Denies discharge or itching.  Mostly just pain and soreness.    Of note, pt notes "ring" shaped lesions on trunk/upper arm- prior recommendations per Dr. Despina Hidden  Presents with her husband  Patient's last menstrual period was 01/13/2021.  Depression screen [facility] 2/9 04/06/2021  Decreased Interest 0  Down, Depressed, Hopeless 0  PHQ - 2 Score 0  Altered sleeping 0  Tired, decreased energy 0  Change in appetite 0  Feeling bad or failure about yourself  0  Trouble concentrating 0  Moving slowly or fidgety/restless 0  Suicidal thoughts 0  PHQ-9 Score 0     Review of Systems:   Pertinent items are noted in HPI Denies fever/chills, dizziness, headaches, visual disturbances, fatigue, shortness of breath, chest pain, abdominal pain, vomiting, bowel movements, urination, or intercourse unless otherwise stated above.  Pertinent History Reviewed:  Reviewed past medical,surgical, social, obstetrical and family history.  Reviewed problem list, medications and allergies. Physical Assessment:   Vitals:   05/21/21 1407  BP: 116/75  Pulse: (!) 101  Weight: 175 lb 3.2 oz (79.5 kg)  Body mass index is 28.28 kg/m.       Physical Examination:   General appearance: alert, well appearing, and in no distress  Psych: mood appropriate, normal affect  Skin: warm & dry   Cardiovascular: normal heart rate noted  Respiratory: normal respiratory effort, no distress  Abdomen: soft, non-tender   Pelvic: VULVA: normal appearing vulva with no masses, well demarcated pink rash ~ 2cm bilateral perineum noted, no other discrete  abnormalities appreciated, no  tenderness or lesions, VAGINA: normal appearing vagina with normal color and discharge, no lesions  Extremities: no edema, ring-shaped lesion noted on right hip and right upper arm  Chaperone: Latisha Cresenzo    Assessment & Plan:  1) Vulvar irritation -plan to r/o underlying infection -suspect dermatitis- reviewed conservative options -topical ointment sent in  2) Ring worm -follow prior recommendations -f/u with PCP if no improvement/worsening   No orders of the defined types were placed in this encounter.   Return for as scheduled.   [name], DO Attending Obstetrician & Gynecologist, [facility] for [employer], [facility]

## 2021-05-25 LAB — CERVICOVAGINAL ANCILLARY ONLY
Bacterial Vaginitis (gardnerella): NEGATIVE
Candida Glabrata: NEGATIVE
Candida Vaginitis: NEGATIVE
Comment: NEGATIVE
Comment: NEGATIVE
Comment: NEGATIVE

## 2021-06-09 ENCOUNTER — Ambulatory Visit (INDEPENDENT_AMBULATORY_CARE_PROVIDER_SITE_OTHER): Payer: PRIVATE HEALTH INSURANCE | Admitting: Obstetrics & Gynecology

## 2021-06-09 ENCOUNTER — Other Ambulatory Visit: Payer: Self-pay

## 2021-06-09 VITALS — BP 113/65 | HR 106 | Wt 180.8 lb

## 2021-06-09 DIAGNOSIS — I809 Phlebitis and thrombophlebitis of unspecified site: Secondary | ICD-10-CM

## 2021-06-09 NOTE — Progress Notes (Signed)
   GYN VISIT Patient name: [name] MRN [id number]  Date of birth: 05-29-1991 Chief Complaint:   Routine Prenatal Visit (W/I for swollen labia)  History of Present Illness:   [name] is a 30 y.o. [phone] @ [redacted]w[redacted]d who presents for GYN concern.  Pt reports that swelling started last night and got progressively worse.  Aside from the swelling she notes considerable pain and discomfort.  She denies drainage or discharge.  No fever or chills.  No other acute complaints  Of note, pt recently finished course of Keflex due to UTI.  Patient's last menstrual period was 01/13/2021.  Depression screen [facility] 2/9 04/06/2021  Decreased Interest 0  Down, Depressed, Hopeless 0  PHQ - 2 Score 0  Altered sleeping 0  Tired, decreased energy 0  Change in appetite 0  Feeling bad or failure about yourself  0  Trouble concentrating 0  Moving slowly or fidgety/restless 0  Suicidal thoughts 0  PHQ-9 Score 0     Review of Systems:   Pertinent items are noted in HPI Denies fever/chills, dizziness, headaches, visual disturbances, fatigue, shortness of breath, chest pain, abdominal pain, vomiting, bowel movements, urination, or intercourse unless otherwise stated above.  Pertinent History Reviewed:  Reviewed problem list, medications and allergies. Physical Assessment:   Vitals:   06/09/21 1405  BP: 113/65  Pulse: (!) 106  Weight: 180 lb 12.8 oz (82 kg)  Body mass index is 29.18 kg/m.       Physical Examination:   General appearance: alert, well appearing, and in no distress  Psych: mood appropriate, normal affect  Skin: warm & dry   Cardiovascular: normal heart rate noted  Respiratory: normal respiratory effort, no distress  Abdomen: soft, non-tender   Pelvic: Upper portion of labia majora ~ 3cm x 2cm- erythematous, tender to palpation, though firmness noted, no induration appreciated, swelling seems reducible lying down and with palpation.  Small varicosity noted.  Per pt pain  radiate diagonally into mons- no discrete lymphadenopathy appreciated  Extremities: no edema   Chaperone: Faith Rogue    Assessment & Plan:  1) Phlebitis vs early abscess -based on exam concern for phlebitis -reviewed conservative therapy for the next couple of days, f/u as scheduled on Monday -if no improvement or worsening of symptoms, pt to call back in- may consider short course of ABX to treat abscess -Questions and concerns were addressed   Return for as scheduled.   Judith Hidalgo, DO Attending Obstetrician & Gynecologist, [facility] for [employer], [facility]

## 2021-06-15 ENCOUNTER — Ambulatory Visit (INDEPENDENT_AMBULATORY_CARE_PROVIDER_SITE_OTHER): Payer: PRIVATE HEALTH INSURANCE | Admitting: Women's Health

## 2021-06-15 ENCOUNTER — Other Ambulatory Visit: Payer: Self-pay

## 2021-06-15 ENCOUNTER — Encounter: Payer: Self-pay | Admitting: Women's Health

## 2021-06-15 VITALS — BP 100/72 | HR 92 | Wt 176.0 lb

## 2021-06-15 DIAGNOSIS — Z3482 Encounter for supervision of other normal pregnancy, second trimester: Secondary | ICD-10-CM

## 2021-06-15 DIAGNOSIS — Z3480 Encounter for supervision of other normal pregnancy, unspecified trimester: Secondary | ICD-10-CM

## 2021-06-15 NOTE — Progress Notes (Signed)
LOW-RISK PREGNANCY VISIT Patient name: [name] MRN [id number]  Date of birth: June 06, 1991 Chief Complaint:   Routine Prenatal Visit  History of Present Illness:   [name] is a 30 y.o. G15P1011 female at [redacted]w[redacted]d with an Estimated Date of Delivery: 10/13/21 being seen today for ongoing management of a low-risk pregnancy.   Today she reports  n/v, has tried to stop otc unisom/vitb6, but gets sick- has noticed  baby doesn't move as much when she takes it, so is only taking vitb6 right since Friday, has only gotten sick once, has zofran and it helps. Had left swollen labia 11/29, saw Dr. Charlotta Newton, the day or so active had a red tender bump pop up but has since improved-declined exam today, will let us know if it returns.  Contractions: Not present. Vag. Bleeding: None.  Movement: Present. denies leaking of fluid.  Depression screen [facility] 2/9 04/06/2021  Decreased Interest 0  Down, Depressed, Hopeless 0  PHQ - 2 Score 0  Altered sleeping 0  Tired, decreased energy 0  Change in appetite 0  Feeling bad or failure about yourself  0  Trouble concentrating 0  Moving slowly or fidgety/restless 0  Suicidal thoughts 0  PHQ-9 Score 0     GAD 7 : Generalized Anxiety Score 04/06/2021  Nervous, Anxious, on Edge 0  Control/stop worrying 0  Worry too much - different things 0  Trouble relaxing 0  Restless 0  Easily annoyed or irritable 0  Afraid - awful might happen 0  Total GAD 7 Score 0      Review of Systems:   Pertinent items are noted in HPI Denies abnormal vaginal discharge w/ itching/odor/irritation, headaches, visual changes, shortness of breath, chest pain, abdominal pain, severe nausea/vomiting, or problems with urination or bowel movements unless otherwise stated above. Pertinent History Reviewed:  Reviewed past medical,surgical, social, obstetrical and family history.  Reviewed problem list, medications and allergies. Physical Assessment:   Vitals:   06/15/21 0837  BP:  100/72  Pulse: 92  Weight: 176 lb (79.8 kg)  Body mass index is 28.41 kg/m.        Physical Examination:   General appearance: Well appearing, and in no distress  Mental status: Alert, oriented to person, place, and time  Skin: Warm & dry  Cardiovascular: Normal heart rate noted  Respiratory: Normal respiratory effort, no distress  Abdomen: Soft, gravid, nontender  Pelvic: Cervical exam deferred         Extremities: Edema: Trace  Fetal Status: Fetal Heart Rate (bpm): 156 Fundal Height: 22 cm Movement: Present    Chaperone: N/A   No results found for this or any previous visit (from the past 24 hour(s)).  Assessment & Plan:  1) Low-risk pregnancy G3P1011 at [redacted]w[redacted]d with an Estimated Date of Delivery: 10/13/21   2) N/V, doing ok on vitb6 and zofran, doesn't like taking unisom b/c makes baby sleepy/not move as much  3) Presumed resolving boil> declined exam today, will let us know if returns  4) H/O GHTN> bp great, continue ASA   Meds: No orders of the defined types were placed in this encounter.  Labs/procedures today: none  Plan:  Continue routine obstetrical care  Next visit: prefers will be in person for pn2     Reviewed: Preterm labor symptoms and general obstetric precautions including but not limited to vaginal bleeding, contractions, leaking of fluid and fetal movement were reviewed in detail with the patient.  All questions were answered. Does  have home bp cuff. Office bp cuff given: not applicable. Check bp weekly, let us know if consistently >140 and/or >90.  Follow-up: Return in about 4 weeks (around 07/13/2021) for LROB, PN2, CNM, in person.  Future Appointments  Date Time Provider Department Center  07/14/2021  8:30 AM [facility]  07/14/2021  9:10 AM Eure, Amaryllis Dyke, MD [facility]    No orders of the defined types were placed in this encounter.  Cheral Marker CNM, [facility] 06/15/2021 9:21 AM

## 2021-06-15 NOTE — Patient Instructions (Signed)
[name], thank you for choosing our office today! We appreciate the opportunity to meet your healthcare needs. You may receive a short survey by mail, e-mail, or through MyChart. If you are happy with your care we would appreciate if you could take just a few minutes to complete the survey questions. We read all of your comments and take your feedback very seriously. Thank you again for choosing our office.  [facility] [place] ([address] [place], [place] [address]) Entrance C, located off of [address] Free 24/7 valet parking   You will have your sugar test next visit.  Please do not eat or drink anything after midnight the night before you come, not even water.  You will be here for at least two hours.  Please make an appointment online for the bloodwork at [email or web address] for 8:00am (or as close to this as possible). Make sure you select the [address] service center.   CLASSES: Go to [email or web address] to register for classes (childbirth, breastfeeding, waterbirth, infant CPR, daddy bootcamp, etc.)  Call the office ([phone]) or go to [facility] if: You begin to have strong, frequent contractions Your water breaks.  Sometimes it is a big gush of fluid, sometimes it is just a trickle that keeps getting your panties wet or running down your legs You have vaginal bleeding.  It is normal to have a small amount of spotting if your cervix was checked.  You don't feel your baby moving like normal.  If you don't, get you something to eat and drink and lay down and focus on feeling your baby move.   If your baby is still not moving like normal, you should call the office or go to [facility].  Call the office ([phone]) or go to [facility] for these signs of pre-eclampsia: Severe headache that does not go away with Tylenol Visual changes- seeing spots, double, blurred vision Pain under your right breast or  upper abdomen that does not go away with Tums or heartburn medicine Nausea and/or vomiting Severe swelling in your hands, feet, and face    [place] Pediatricians/Family Doctors [facility] ([facility]): [address], [phone]           [facility]: [address], [phone]                [facility] ([facility]): [address], [phone] (call to ask if accepting patients) [facility]: [address], [place], [phone]    [place] Pediatricians/Family Doctors [facility] ([facility]): [address], [phone] [facility]: [address], [phone] [facility]: [address], [phone]  [place] [facility]  [facility] ([facility]): [phone] [facility]: [address], [phone]   [place] Family Doctors [facility]: [address], [phone]  [place] [facility]  [facility]: [address], [phone]  Home Blood Pressure Monitoring for Patients   Your provider has recommended that you check your blood pressure (BP) at least once a week at home. If you do not have a blood pressure cuff at home, one will be provided for you. Contact your provider if you have not received your monitor within 1 week.   Helpful Tips for Accurate Home Blood Pressure Checks  Don't smoke, exercise, or drink   caffeine 30 minutes before checking your BP Use the restroom before checking your BP (a full bladder can raise your pressure) Relax in a comfortable upright chair Feet on the ground Left arm resting comfortably on a flat surface at the level of your heart Legs uncrossed Back supported Sit quietly and don't talk Place the cuff on your bare arm Adjust snuggly, so that only two fingertips can fit between your skin and the top of the cuff Check 2  readings separated by at least one minute Keep a log of your BP readings For a visual, please reference this diagram: http://ccnc.care/bpdiagram  Provider Name: [facility]     Phone: [phone]  Zone 1: ALL CLEAR  Continue to monitor your symptoms:  BP reading is less than 140 (top number) or less than 90 (bottom number)  No right upper stomach pain No headaches or seeing spots No feeling nauseated or throwing up No swelling in face and hands  Zone 2: CAUTION Call your doctor's office for any of the following:  BP reading is greater than 140 (top number) or greater than 90 (bottom number)  Stomach pain under your ribs in the middle or right side Headaches or seeing spots Feeling nauseated or throwing up Swelling in face and hands  Zone 3: EMERGENCY  Seek immediate medical care if you have any of the following:  BP reading is greater than160 (top number) or greater than 110 (bottom number) Severe headaches not improving with Tylenol Serious difficulty catching your breath Any worsening symptoms from Zone 2   Second Trimester of Pregnancy The second trimester is from week 13 through week 28, months 4 through 6. The second trimester is often a time when you feel your best. Your body has also adjusted to being pregnant, and you begin to feel better physically. Usually, morning sickness has lessened or quit completely, you may have more energy, and you may have an increase in appetite. The second trimester is also a time when the fetus is growing rapidly. At the end of the sixth month, the fetus is about 9 inches long and weighs about 1 pounds. You will likely begin to feel the baby move (quickening) between 18 and 20 weeks of the pregnancy. BODY CHANGES Your body goes through many changes during pregnancy. The changes vary from woman to woman.  Your weight will continue to increase. You will notice your lower abdomen bulging out. You may begin to get stretch marks on your  hips, abdomen, and breasts. You may develop headaches that can be relieved by medicines approved by your health care provider. You may urinate more often because the fetus is pressing on your bladder. You may develop or continue to have heartburn as a result of your pregnancy. You may develop constipation because certain hormones are causing the muscles that push waste through your intestines to slow down. You may develop hemorrhoids or swollen, bulging veins (varicose veins). You may have back pain because of the weight gain and pregnancy hormones relaxing your joints between the bones in your pelvis and as a result of a shift in weight and the muscles that support your balance. Your breasts will continue to grow and be tender. Your gums may bleed and may be sensitive to brushing and flossing. Dark spots or blotches (chloasma, mask of pregnancy) may develop on your face. This will likely fade after the baby is born. A dark line from your belly button to the pubic area (linea nigra) may appear. This   will likely fade after the baby is born. You may have changes in your hair. These can include thickening of your hair, rapid growth, and changes in texture. Some women also have hair loss during or after pregnancy, or hair that feels dry or thin. Your hair will most likely return to normal after your baby is born. WHAT TO EXPECT AT YOUR PRENATAL VISITS During a routine prenatal visit: You will be weighed to make sure you and the fetus are growing normally. Your blood pressure will be taken. Your abdomen will be measured to track your baby's growth. The fetal heartbeat will be listened to. Any test results from the previous visit will be discussed. Your health care provider may ask you: How you are feeling. If you are feeling the baby move. If you have had any abnormal symptoms, such as leaking fluid, bleeding, severe headaches, or abdominal cramping. If you have any questions. Other tests that may  be performed during your second trimester include: Blood tests that check for: Low iron levels (anemia). Gestational diabetes (between 24 and 28 weeks). Rh antibodies. Urine tests to check for infections, diabetes, or protein in the urine. An ultrasound to confirm the proper growth and development of the baby. An amniocentesis to check for possible genetic problems. Fetal screens for spina bifida and Down syndrome. HOME CARE INSTRUCTIONS  Avoid all smoking, herbs, alcohol, and unprescribed drugs. These chemicals affect the formation and growth of the baby. Follow your health care provider's instructions regarding medicine use. There are medicines that are either safe or unsafe to take during pregnancy. Exercise only as directed by your health care provider. Experiencing uterine cramps is a good sign to stop exercising. Continue to eat regular, healthy meals. Wear a good support bra for breast tenderness. Do not use hot tubs, steam rooms, or saunas. Wear your seat belt at all times when driving. Avoid raw meat, uncooked cheese, cat litter boxes, and soil used by cats. These carry germs that can cause birth defects in the baby. Take your prenatal vitamins. Try taking a stool softener (if your health care provider approves) if you develop constipation. Eat more high-fiber foods, such as fresh vegetables or fruit and whole grains. Drink plenty of fluids to keep your urine clear or pale yellow. Take warm sitz baths to soothe any pain or discomfort caused by hemorrhoids. Use hemorrhoid cream if your health care provider approves. If you develop varicose veins, wear support hose. Elevate your feet for 15 minutes, 3-4 times a day. Limit salt in your diet. Avoid heavy lifting, wear low heel shoes, and practice good posture. Rest with your legs elevated if you have leg cramps or low back pain. Visit your dentist if you have not gone yet during your pregnancy. Use a soft toothbrush to brush your teeth  and be gentle when you floss. A sexual relationship may be continued unless your health care provider directs you otherwise. Continue to go to all your prenatal visits as directed by your health care provider. SEEK MEDICAL CARE IF:  You have dizziness. You have mild pelvic cramps, pelvic pressure, or nagging pain in the abdominal area. You have persistent nausea, vomiting, or diarrhea. You have a bad smelling vaginal discharge. You have pain with urination. SEEK IMMEDIATE MEDICAL CARE IF:  You have a fever. You are leaking fluid from your vagina. You have spotting or bleeding from your vagina. You have severe abdominal cramping or pain. You have rapid weight gain or loss. You have shortness of   breath with chest pain. You notice sudden or extreme swelling of your face, hands, ankles, feet, or legs. You have not felt your baby move in over an hour. You have severe headaches that do not go away with medicine. You have vision changes. Document Released: 06/22/2001 Document Revised: 07/03/2013 Document Reviewed: 08/29/2012 ExitCare Patient Information 2015 [employer]. This information is not intended to replace advice given to you by your health care provider. Make sure you discuss any questions you have with your health care provider.  

## 2021-07-01 ENCOUNTER — Encounter: Payer: Self-pay | Admitting: Obstetrics & Gynecology

## 2021-07-03 ENCOUNTER — Other Ambulatory Visit: Payer: Self-pay | Admitting: Obstetrics & Gynecology

## 2021-07-03 DIAGNOSIS — K219 Gastro-esophageal reflux disease without esophagitis: Secondary | ICD-10-CM

## 2021-07-03 MED ORDER — OMEPRAZOLE 10 MG PO CPDR: 20.0000 mg | DELAYED_RELEASE_CAPSULE | Freq: Every day | ORAL | 0 refills | Status: DC

## 2021-07-03 NOTE — Progress Notes (Signed)
Prescription sent in for GERD

## 2021-07-04 ENCOUNTER — Encounter: Payer: PRIVATE HEALTH INSURANCE | Admitting: Nurse Practitioner

## 2021-07-04 NOTE — Progress Notes (Signed)
Mom Lesly Pontarelli wanted visit for son [name]. She accidentally scheduled under her account instead. Visit has been completed under son's profile at this time.

## 2021-07-04 NOTE — Progress Notes (Signed)
MOM needs mychart visit for son [name] not her. I have instructed her that she Will need to schedule visit for [facility] under his mychart.

## 2021-07-12 NOTE — L&D Delivery Note (Signed)
Delivery Note ?At 10:18 AM a viable female was delivered via Vaginal, Spontaneous (Presentation:   Occiput Anterior).  APGAR: 9, 9; weight 7 lb 5.8 oz (3340 g).   ?Placenta status: Spontaneous;Expressed, Intact.  Cord: 3 vessels with the following complications: None.  Cord pH: not collected ? ?Anesthesia: Epidural ?Episiotomy: None ?Lacerations: None ?Suture Repair:  NA ?Est. Blood Loss (mL): 50 ? ?Mom to postpartum.  Baby to Couplet care / Skin to Skin. ? ?Federico Flake ?09/24/2021, 6:24 PM ? ? ? ?

## 2021-07-14 ENCOUNTER — Other Ambulatory Visit: Payer: 59

## 2021-07-14 ENCOUNTER — Other Ambulatory Visit: Payer: Self-pay

## 2021-07-14 ENCOUNTER — Encounter: Payer: Self-pay | Admitting: Obstetrics & Gynecology

## 2021-07-14 ENCOUNTER — Ambulatory Visit (INDEPENDENT_AMBULATORY_CARE_PROVIDER_SITE_OTHER): Payer: 59 | Admitting: Obstetrics & Gynecology

## 2021-07-14 VITALS — BP 118/82 | HR 100 | Wt 182.5 lb

## 2021-07-14 DIAGNOSIS — Z131 Encounter for screening for diabetes mellitus: Secondary | ICD-10-CM

## 2021-07-14 DIAGNOSIS — Z3483 Encounter for supervision of other normal pregnancy, third trimester: Secondary | ICD-10-CM | POA: Diagnosis not present

## 2021-07-14 DIAGNOSIS — Z3A27 27 weeks gestation of pregnancy: Secondary | ICD-10-CM | POA: Diagnosis not present

## 2021-07-14 DIAGNOSIS — Z3480 Encounter for supervision of other normal pregnancy, unspecified trimester: Secondary | ICD-10-CM

## 2021-07-14 DIAGNOSIS — M545 Low back pain, unspecified: Secondary | ICD-10-CM

## 2021-07-14 LAB — POCT URINALYSIS DIPSTICK OB
Blood, UA: NEGATIVE
Glucose, UA: NEGATIVE
Ketones, UA: NEGATIVE
Leukocytes, UA: NEGATIVE
Nitrite, UA: NEGATIVE
POC,PROTEIN,UA: NEGATIVE

## 2021-07-14 NOTE — Progress Notes (Signed)
LOW-RISK PREGNANCY VISIT Patient name: [name] MRN [id number]  Date of birth: 15-Dec-1990 Chief Complaint:   Routine Prenatal Visit (PN2 today; Low back pain; feels like a sharp cramp)  History of Present Illness:   [name] is a 31 y.o. G50P1011 female at [redacted]w[redacted]d with an Estimated Date of Delivery: 10/13/21 being seen today for ongoing management of a low-risk pregnancy.  Depression screen [facility] 2/9 07/14/2021 04/06/2021  Decreased Interest 0 0  Down, Depressed, Hopeless 0 0  PHQ - 2 Score 0 0  Altered sleeping 0 0  Tired, decreased energy 0 0  Change in appetite 0 0  Feeling bad or failure about yourself  0 0  Trouble concentrating 0 0  Moving slowly or fidgety/restless 0 0  Suicidal thoughts 0 0  PHQ-9 Score 0 0    Today she reports no complaints. Contractions: Not present. Vag. Bleeding: None.  Movement: Present. denies leaking of fluid. Review of Systems:   Pertinent items are noted in HPI Denies abnormal vaginal discharge w/ itching/odor/irritation, headaches, visual changes, shortness of breath, chest pain, abdominal pain, severe nausea/vomiting, or problems with urination or bowel movements unless otherwise stated above. Pertinent History Reviewed:  Reviewed past medical,surgical, social, obstetrical and family history.  Reviewed problem list, medications and allergies. Physical Assessment:   Vitals:   07/14/21 0834  BP: 118/82  Pulse: 100  Weight: 182 lb 8 oz (82.8 kg)  Body mass index is 29.46 kg/m.        Physical Examination:   General appearance: Well appearing, and in no distress  Mental status: Alert, oriented to person, place, and time  Skin: Warm & dry  Cardiovascular: Normal heart rate noted  Respiratory: Normal respiratory effort, no distress  Abdomen: Soft, gravid, nontender  Pelvic: Cervical exam deferred         Extremities: Edema: Trace  Fetal Status: Fetal Heart Rate (bpm): 140 Fundal Height: 28 cm Movement: Present    Chaperone: n/a     Results for orders placed or performed in visit on 07/14/21 (from the past 24 hour(s))  POC Urinalysis Dipstick OB   Collection Time: 07/14/21  8:41 AM  Result Value Ref Range   Color, UA     Clarity, UA     Glucose, UA Negative Negative   Bilirubin, UA     Ketones, UA neg    Spec Grav, UA     Blood, UA neg    pH, UA     POC,PROTEIN,UA Negative Negative, Trace, Small (1+), Moderate (2+), Large (3+), 4+   Urobilinogen, UA     Nitrite, UA neg    Leukocytes, UA Negative Negative   Appearance     Odor      Assessment & Plan:  1) Low-risk pregnancy G3P1011 at [redacted]w[redacted]d with an Estimated Date of Delivery: 10/13/21   2) Back pain/spasm, recommended prenatal yoga and lacrosse ball self massage   Meds: No orders of the defined types were placed in this encounter.  Labs/procedures today: PN2 pending  Plan:  Continue routine obstetrical care  Next visit: prefers in person    Reviewed: Preterm labor symptoms and general obstetric precautions including but not limited to vaginal bleeding, contractions, leaking of fluid and fetal movement were reviewed in detail with the patient.  All questions were answered. Has home bp cuff. Rx faxed to . Check bp weekly, let us know if >140/90.   Follow-up: Return in about 3 weeks (around 08/04/2021) for LROB.  Orders Placed This  Encounter  Procedures   POC Urinalysis Dipstick OB    Lazaro Arms, MD 07/14/2021 10:18 AM

## 2021-07-15 LAB — HIV ANTIBODY (ROUTINE TESTING W REFLEX): HIV Screen 4th Generation wRfx: NONREACTIVE

## 2021-07-15 LAB — CBC
Hematocrit: 36.1 % (ref 34.0–46.6)
Hemoglobin: 12.6 g/dL (ref 11.1–15.9)
MCH: 33.2 pg — ABNORMAL HIGH (ref 26.6–33.0)
MCHC: 34.9 g/dL (ref 31.5–35.7)
MCV: 95 fL (ref 79–97)
Platelets: 200 10*3/uL (ref 150–450)
RBC: 3.8 x10E6/uL (ref 3.77–5.28)
RDW: 12.2 % (ref 11.7–15.4)
WBC: 9.2 10*3/uL (ref 3.4–10.8)

## 2021-07-15 LAB — ANTIBODY SCREEN: Antibody Screen: NEGATIVE

## 2021-07-15 LAB — GLUCOSE TOLERANCE, 2 HOURS W/ 1HR
Glucose, 1 hour: 147 mg/dL (ref 70–179)
Glucose, 2 hour: 98 mg/dL (ref 70–152)
Glucose, Fasting: 72 mg/dL (ref 70–91)

## 2021-07-15 LAB — RPR: RPR Ser Ql: NONREACTIVE

## 2021-07-22 ENCOUNTER — Telehealth: Payer: Self-pay | Admitting: Women's Health

## 2021-07-22 ENCOUNTER — Other Ambulatory Visit: Payer: Self-pay

## 2021-07-22 ENCOUNTER — Other Ambulatory Visit (INDEPENDENT_AMBULATORY_CARE_PROVIDER_SITE_OTHER): Payer: 59

## 2021-07-22 VITALS — BP 113/74 | HR 109 | Temp 98.2°F

## 2021-07-22 DIAGNOSIS — Z3A28 28 weeks gestation of pregnancy: Secondary | ICD-10-CM | POA: Diagnosis not present

## 2021-07-22 DIAGNOSIS — R509 Fever, unspecified: Secondary | ICD-10-CM | POA: Diagnosis not present

## 2021-07-22 DIAGNOSIS — M545 Low back pain, unspecified: Secondary | ICD-10-CM

## 2021-07-22 DIAGNOSIS — Z3480 Encounter for supervision of other normal pregnancy, unspecified trimester: Secondary | ICD-10-CM | POA: Diagnosis not present

## 2021-07-22 LAB — POCT URINALYSIS DIPSTICK OB
Blood, UA: NEGATIVE
Glucose, UA: NEGATIVE
Ketones, UA: NEGATIVE
Leukocytes, UA: NEGATIVE
Nitrite, UA: NEGATIVE
POC,PROTEIN,UA: NEGATIVE

## 2021-07-22 NOTE — Telephone Encounter (Signed)
Patient states she had a fever 100.9 last night, Tylenol would bring temp down but then it would go back up.  Took a COVID test an was negative. She is having body aches and lower back pain.  No other symptoms. She is concerned that in the past she had pyelonephritis and did not have any symptoms, only a fever and was found to be in preterm labor and was started on Procardia.  Advised to come to office this afternoon for urine dip and possible culture to r/o UTI. Pt agreeable to plan.

## 2021-07-22 NOTE — Progress Notes (Addendum)
° °  NURSE VISIT- UTI SYMPTOMS   SUBJECTIVE:  [name] is a 31 y.o. G62P1011 female here for UTI symptoms. She is [redacted]w[redacted]d pregnant. She reports body aches, fever last night 100.3 and lower back pain .  Denies dysuria, frequency, urgency.  Was diagnosed with Pyelonephritis with previous pregnancy. Only symptoms were fever.   OBJECTIVE:  LMP 01/13/2021   Appears well, in no apparent distress  Results for orders placed or performed in visit on 07/22/21 (from the past 24 hour(s))  POC Urinalysis Dipstick OB   Collection Time: 07/22/21  1:51 PM  Result Value Ref Range   Color, UA     Clarity, UA     Glucose, UA Negative Negative   Bilirubin, UA     Ketones, UA neg    Spec Grav, UA     Blood, UA neg    pH, UA     POC,PROTEIN,UA Negative Negative, Trace, Small (1+), Moderate (2+), Large (3+), 4+   Urobilinogen, UA     Nitrite, UA neg    Leukocytes, UA Negative Negative   Appearance     Odor      ASSESSMENT: Pregnancy [redacted]w[redacted]d with UTI symptoms and negative nitrites  PLAN: Discussed with Philipp Deputy, CNM   Rx sent by provider today: No Urine culture sent Call or return to clinic prn if these symptoms worsen or fail to improve as anticipated. Follow-up: as scheduled   Jobe Marker  07/22/2021 1:52 PM

## 2021-07-22 NOTE — Telephone Encounter (Signed)
Patient is [redacted] weeks pregnant. She started running fever last night, and the headache and bodyaches yesterday morning. Please advise.

## 2021-07-23 DIAGNOSIS — Z20828 Contact with and (suspected) exposure to other viral communicable diseases: Secondary | ICD-10-CM | POA: Diagnosis not present

## 2021-07-23 DIAGNOSIS — Z20822 Contact with and (suspected) exposure to covid-19: Secondary | ICD-10-CM | POA: Diagnosis not present

## 2021-07-24 ENCOUNTER — Telehealth: Payer: 59 | Admitting: Family

## 2021-07-24 ENCOUNTER — Telehealth: Payer: Self-pay | Admitting: Obstetrics & Gynecology

## 2021-07-24 DIAGNOSIS — J069 Acute upper respiratory infection, unspecified: Secondary | ICD-10-CM

## 2021-07-24 LAB — URINE CULTURE: Organism ID, Bacteria: NO GROWTH

## 2021-07-24 MED ORDER — FLUTICASONE PROPIONATE 50 MCG/ACT NA SUSP: 2.0000 | Freq: Every day | NASAL | 6 refills | Status: DC

## 2021-07-24 NOTE — Telephone Encounter (Signed)
Patient had evisit with pcp today for cold symptoms. They gave her some nasal medicine and told her to consult with her ob doctor to see if anything else can be given. She has a constant cough during the day. She states that is doesn't keep her up at night though. She has tried cough drops but they're not helping. Please advise.

## 2021-07-24 NOTE — Progress Notes (Signed)

## 2021-07-26 ENCOUNTER — Other Ambulatory Visit: Payer: Self-pay

## 2021-07-26 ENCOUNTER — Ambulatory Visit
Admission: EM | Admit: 2021-07-26 | Discharge: 2021-07-26 | Disposition: A | Discharge status: Home / Self Care | Payer: 59 | Attending: Family Medicine | Admitting: Family Medicine

## 2021-07-26 DIAGNOSIS — J209 Acute bronchitis, unspecified: Secondary | ICD-10-CM

## 2021-07-26 MED ORDER — BUDESONIDE-FORMOTEROL FUMARATE 160-4.5 MCG/ACT IN AERO: 2.0000 | INHALATION_SPRAY | Freq: Two times a day (BID) | RESPIRATORY_TRACT | 0 refills | Status: DC

## 2021-07-26 NOTE — ED Triage Notes (Signed)
Patient states that on Monday night she started with a fever.   Patient states that she feels better now but she can not get rid of her cough.   Patient states that she has tried honey tea and Delsym cough syrup without any relief.   Patient states she has had multiple flu tests and an at home Covid and they were all negative.   Patient is [redacted] weeks pregnant tomorrow

## 2021-07-26 NOTE — ED Provider Notes (Signed)
[facility]    CSN: [id number] Arrival date & time: 07/26/21  [id number]      History   Chief Complaint Chief Complaint  Patient presents with   Cough    HPI [name] is a 31 y.o. female.   Patient resenting today with 6-day history of initially fever, body aches, chills, deep cough, scratchy throat.  Fever, chills, body aches have improved but the cough has worsened the past few days and is now deep and hacking.  She states the cough is keeping her up at night.  She denies chest pain, shortness of breath, abdominal pain, nausea vomiting or diarrhea.  Taking Delsym and honey tea with minimal relief.  Of note, she is [redacted] weeks pregnant.  She did an E-visit several days ago for her symptoms and was given Flonase which she has not taken as she does not feel nasal congestion.  She states she has been tested for flu and COVID since onset of symptoms and both were negative.  No known history of chronic pulmonary disease.  States she had bronchitis that felt just like this with her previous pregnancy and improved on antibiotics.   Past Medical History:  Diagnosis Date   Gestational hypertension, antepartum 06/06/2012   No pertinent past medical history    PONV (postoperative nausea and vomiting)    Pyelonephritis    Urinary tract infection     Patient Active Problem List   Diagnosis Date Noted   History of gestational hypertension 04/06/2021   Encounter for supervision of normal pregnancy, antepartum 04/06/2021   Family history of breast cancer in mother 04/06/2021    Past Surgical History:  Procedure Laterality Date   Bladder stem stretcher     DILATION AND EVACUATION N/A 03/30/2016   Procedure: DILATATION AND EVACUATION--suction D&C;  Surgeon: Lazaro ArmsLuther H Eure, MD;  Location: [facility];  Service: Gynecology;  Laterality: N/A;   GALLBLADDER SURGERY     TONSILLECTOMY      OB History     Gravida  3   Para  1   Term  1   Preterm  0   AB  1   Living  1       SAB  1   IAB  0   Ectopic  0   Multiple  0   Live Births  1            Home Medications    Prior to Admission medications   Medication Sig Start Date End Date Taking? Authorizing Provider  budesonide-formoterol (SYMBICORT) 160-4.5 MCG/ACT inhaler Inhale 2 puffs into the lungs 2 (two) times daily. Rinse mouth with water after each use 07/26/21  Yes Particia NearingLane, Jakyla Reza Elizabeth, PA-C  aspirin 81 MG EC tablet Take 2 tablets (162 mg total) by mouth daily. Swallow whole. 04/06/21   Cheral MarkerBooker, Kimberly R, CNM  Calcium Carbonate Antacid (TUMS PO) Take by mouth.    [provider]  doxylamine, Sleep, (UNISOM) 25 MG tablet Take 25 mg by mouth at bedtime as needed.    [provider]  fluticasone (FLONASE) 50 MCG/ACT nasal spray Place 2 sprays into both nostrils daily. 07/24/21   Junie SpencerHawks, Christy A, FNP  ondansetron (ZOFRAN ODT) 4 MG disintegrating tablet 4mg  ODT q4 hours prn nausea/vomit 04/08/21   Mesner, Barbara CowerJason, MD  Prenatal MV & Min w/FA-DHA (PRENATAL ADULT GUMMY/DHA/FA PO) Take by mouth. Takes 2 daily    [provider]  promethazine (PHENERGAN) 25 MG tablet Take 1 tablet (25 mg  total) by mouth every 6 (six) hours as needed for nausea or vomiting. Patient not taking: Reported on 05/21/2021 04/08/21   Mesner, Barbara Cower, MD  pyridOXINE (VITAMIN B-6) 100 MG tablet Take 100 mg by mouth 2 (two) times daily.    [provider]    Family History Family History  Problem Relation Age of Onset   Breast cancer Mother    Leukemia Paternal Grandfather    Other Neg Hx     Social History Social History   Tobacco Use   Smoking status: Never   Smokeless tobacco: Never  Vaping Use   Vaping Use: Never used  Substance Use Topics   Alcohol use: No   Drug use: No     Allergies   Patient has no known allergies.   Review of Systems Review of Systems Per HPI  Physical Exam Triage Vital Signs ED Triage Vitals  Enc Vitals Group     BP 07/26/21 1044 110/72     Pulse  Rate 07/26/21 1044 (!) 114     Resp 07/26/21 1044 18     Temp 07/26/21 1044 98.6 F (37 C)     Temp Source 07/26/21 1044 Oral     SpO2 07/26/21 1044 98 %     Weight --      Height --      Head Circumference --      Peak Flow --      Pain Score 07/26/21 1046 5     Pain Loc --      Pain Edu? --      Excl. in GC? --    No data found.  Updated Vital Signs BP 110/72 (BP Location: Right Arm)    Pulse (!) 114    Temp 98.6 F (37 C) (Oral)    Resp 18    LMP 01/13/2021    SpO2 98%   Visual Acuity Right Eye Distance:   Left Eye Distance:   Bilateral Distance:    Right Eye Near:   Left Eye Near:    Bilateral Near:     Physical Exam Vitals and nursing note reviewed.  Constitutional:      Appearance: Normal appearance.  HENT:     Head: Atraumatic.     Right Ear: Tympanic membrane and external ear normal.     Left Ear: Tympanic membrane and external ear normal.     Nose:     Comments: Nasal mucosa erythematous and edematous    Mouth/Throat:     Mouth: Mucous membranes are moist.     Pharynx: Posterior oropharyngeal erythema present.  Eyes:     Extraocular Movements: Extraocular movements intact.     Conjunctiva/sclera: Conjunctivae normal.  Cardiovascular:     Rate and Rhythm: Normal rate and regular rhythm.     Heart sounds: Normal heart sounds.  Pulmonary:     Effort: Pulmonary effort is normal.     Breath sounds: Normal breath sounds. No wheezing or rales.  Musculoskeletal:        General: Normal range of motion.     Cervical back: Normal range of motion and neck supple.  Skin:    General: Skin is warm and dry.  Neurological:     Mental Status: She is alert and oriented to person, place, and time.  Psychiatric:        Mood and Affect: Mood normal.        Thought Content: Thought content normal.     UC Treatments / Results  Labs (all labs ordered are listed, but only abnormal results are displayed) Labs Reviewed - No data to display  EKG   Radiology No  results found.  Procedures Procedures (including critical care time)  Medications Ordered in UC Medications - No data to display  Initial Impression / Assessment and Plan / UC Course  I have reviewed the triage vital signs and the nursing notes.  Pertinent labs & imaging results that were available during my care of the patient were reviewed by me and considered in my medical decision making (see chart for details).     Suspect postviral bronchitis, will treat with steroid inhaler given pregnant state in addition to Mucinex, Delsym, supportive home care.  Discussed return precautions for worsening symptoms.  No evidence of secondary bacterial infection today.  Oxygen saturation 98% on room air and breathing comfortably, speaking in full sentences.  Follow-up with PCP or OB/GYN next week for recheck.  Final Clinical Impressions(s) / UC Diagnoses   Final diagnoses:  Acute bronchitis, unspecified organism   Discharge Instructions   None    ED Prescriptions     Medication Sig Dispense Auth. Provider   budesonide-formoterol (SYMBICORT) 160-4.5 MCG/ACT inhaler Inhale 2 puffs into the lungs 2 (two) times daily. Rinse mouth with water after each use 1 each Particia Nearing, PA-C      PDMP not reviewed this encounter.   Particia Nearing, [place] 07/26/21 1127

## 2021-07-27 ENCOUNTER — Other Ambulatory Visit: Payer: Self-pay | Admitting: Women's Health

## 2021-07-27 ENCOUNTER — Telehealth: Payer: Self-pay | Admitting: *Deleted

## 2021-07-27 MED ORDER — BENZONATATE 100 MG PO CAPS: 100.0000 mg | ORAL_CAPSULE | Freq: Three times a day (TID) | ORAL | 0 refills | Status: DC | PRN

## 2021-07-27 NOTE — Telephone Encounter (Signed)
Pt is requesting a prescription be sent in for the cough she has. She has had a virtual visit and inperson urgent care visit recently but they didn't give her any meds. She says that the cough is bothersome and she got sent home from work because of it. She isn't able to rest at night. She has tried delsym and other otc remedies but not helping. Advised that I would send a message to a provider to see if anything else can be sent in.

## 2021-08-04 ENCOUNTER — Ambulatory Visit (INDEPENDENT_AMBULATORY_CARE_PROVIDER_SITE_OTHER): Payer: 59 | Admitting: Women's Health

## 2021-08-04 ENCOUNTER — Encounter: Payer: Self-pay | Admitting: Women's Health

## 2021-08-04 ENCOUNTER — Other Ambulatory Visit: Payer: Self-pay

## 2021-08-04 VITALS — BP 110/73 | HR 85 | Wt 187.5 lb

## 2021-08-04 DIAGNOSIS — Z3483 Encounter for supervision of other normal pregnancy, third trimester: Secondary | ICD-10-CM

## 2021-08-04 DIAGNOSIS — Z3480 Encounter for supervision of other normal pregnancy, unspecified trimester: Secondary | ICD-10-CM

## 2021-08-04 NOTE — Patient Instructions (Signed)
Timara, thank you for choosing our office today! We appreciate the opportunity to meet your healthcare needs. You may receive a short survey by mail, e-mail, or through MyChart. If you are happy with your care we would appreciate if you could take just a few minutes to complete the survey questions. We read all of your comments and take your feedback very seriously. Thank you again for choosing our office.  [facility]  [facility] ([address] [place], [place] [address]) Entrance C, located off of [address] Free 24/7 valet parking   CLASSES: Go to [email or web address] to register for classes (childbirth, breastfeeding, waterbirth, infant CPR, daddy bootcamp, etc.)  Call the office ([phone]) or go to [facility] if: You begin to have strong, frequent contractions Your water breaks.  Sometimes it is a big gush of fluid, sometimes it is just a trickle that keeps getting your panties wet or running down your legs You have vaginal bleeding.  It is normal to have a small amount of spotting if your cervix was checked.  You don't feel your baby moving like normal.  If you don't, get you something to eat and drink and lay down and focus on feeling your baby move.   If your baby is still not moving like normal, you should call the office or go to [facility].  Call the office ([phone]) or go to [facility] for these signs of pre-eclampsia: Severe headache that does not go away with Tylenol Visual changes- seeing spots, double, blurred vision Pain under your right breast or upper abdomen that does not go away with Tums or heartburn medicine Nausea and/or vomiting Severe swelling in your hands, feet, and face   Tdap Vaccine It is recommended that you get the Tdap vaccine during the third trimester of EACH pregnancy to help protect your baby from getting pertussis (whooping cough) 27-36 weeks is the BEST time to do  this so that you can pass the protection on to your baby. During pregnancy is better than after pregnancy, but if you are unable to get it during pregnancy it will be offered at the hospital.  You can get this vaccine with us, at the health department, your family doctor, or some local pharmacies Everyone who will be around your baby should also be up-to-date on their vaccines before the baby comes. Adults (who are not pregnant) only need 1 dose of Tdap during adulthood.   [place] [facility] Doctors [facility] ([facility]): [address], [phone]           [facility]: [address], [phone]                [facility] ([facility]): [address], [phone] (call to ask if accepting patients) [facility]: [address], [place], [phone]    [place] [facility]/Family Doctors [facility] ([facility]): [address], [phone] [facility]: [address], [phone] [facility]: [address], [phone]  [place] [facility]  [facility] ([facility]): [phone] [facility]: [address], [phone]   [place] [facility]: [address], [phone]  [facility]  [facility]: [address], [phone]  Home Blood Pressure Monitoring for Patients   Your provider has recommended that you check your   blood pressure (BP) at least once a week at home. If you do not have a blood pressure cuff at home, one will be provided for you. Contact your provider if you have not received your monitor within 1 week.  ° °Helpful Tips for Accurate Home Blood Pressure Checks  °Don't smoke, exercise, or drink caffeine 30 minutes before checking your BP °Use the restroom before checking your BP (a full bladder can raise your  pressure) °Relax in a comfortable upright chair °Feet on the ground °Left arm resting comfortably on a flat surface at the level of your heart °Legs uncrossed °Back supported °Sit quietly and don't talk °Place the cuff on your bare arm °Adjust snuggly, so that only two fingertips can fit between your skin and the top of the cuff °Check 2 readings separated by at least one minute °Keep a log of your BP readings °For a visual, please reference this diagram: http://ccnc.care/bpdiagram ° °Provider Name: [facility]     Phone: [phone] ° °Zone 1: ALL CLEAR  °Continue to monitor your symptoms:  °BP reading is less than 140 (top number) or less than 90 (bottom number)  °No right upper stomach pain °No headaches or seeing spots °No feeling nauseated or throwing up °No swelling in face and hands ° °Zone 2: CAUTION °Call your doctor's office for any of the following:  °BP reading is greater than 140 (top number) or greater than 90 (bottom number)  °Stomach pain under your ribs in the middle or right side °Headaches or seeing spots °Feeling nauseated or throwing up °Swelling in face and hands ° °Zone 3: EMERGENCY  °Seek immediate medical care if you have any of the following:  °BP reading is greater than160 (top number) or greater than 110 (bottom number) °Severe headaches not improving with Tylenol °Serious difficulty catching your breath °Any worsening symptoms from Zone 2  °Preterm Labor and Birth Information ° °The normal length of a pregnancy is 39-41 weeks. Preterm labor is when labor starts before 37 completed weeks of pregnancy. °What are the risk factors for preterm labor? °Preterm labor is more likely to occur in women who: °Have certain infections during pregnancy such as a bladder infection, sexually transmitted infection, or infection inside the uterus (chorioamnionitis). °Have a shorter-than-normal cervix. °Have gone into preterm labor before. °Have had surgery on their cervix. °Are younger than age 17  or older than age 35. °Are African American. °Are pregnant with twins or multiple babies (multiple gestation). °Take street drugs or smoke while pregnant. °Do not gain enough weight while pregnant. °Became pregnant shortly after having been pregnant. °What are the symptoms of preterm labor? °Symptoms of preterm labor include: °Cramps similar to those that can happen during a menstrual period. The cramps may happen with diarrhea. °Pain in the abdomen or lower back. °Regular uterine contractions that may feel like tightening of the abdomen. °A feeling of increased pressure in the pelvis. °Increased watery or bloody mucus discharge from the vagina. °Water breaking (ruptured amniotic sac). °Why is it important to recognize signs of preterm labor? °It is important to recognize signs of preterm labor because babies who are born prematurely may not be fully developed. This can put them at an increased risk for: °Long-term (chronic) heart and lung problems. °Difficulty immediately after birth with regulating body systems, including blood sugar, body temperature, heart rate, and breathing rate. °Bleeding in the brain. °Cerebral palsy. °Learning difficulties. °Death. °These risks are highest for babies who are born before 34 weeks   of pregnancy. How is preterm labor treated? Treatment depends on the length of your pregnancy, your condition, and the health of your baby. It may involve: Having a stitch (suture) placed in your cervix to prevent your cervix from opening too early (cerclage). Taking or being given medicines, such as: Hormone medicines. These may be given early in pregnancy to help support the pregnancy. Medicine to stop contractions. Medicines to help mature the babys lungs. These may be prescribed if the risk of delivery is high. Medicines to prevent your baby from developing cerebral palsy. If the labor happens before 34 weeks of pregnancy, you may need to stay in the hospital. What should I do if I  think I am in preterm labor? If you think that you are going into preterm labor, call your health care provider right away. How can I prevent preterm labor in future pregnancies? To increase your chance of having a full-term pregnancy: Do not use any tobacco products, such as cigarettes, chewing tobacco, and e-cigarettes. If you need help quitting, ask your health care provider. Do not use street drugs or medicines that have not been prescribed to you during your pregnancy. Talk with your health care provider before taking any herbal supplements, even if you have been taking them regularly. Make sure you gain a healthy amount of weight during your pregnancy. Watch for infection. If you think that you might have an infection, get it checked right away. Make sure to tell your health care provider if you have gone into preterm labor before. This information is not intended to replace advice given to you by your health care provider. Make sure you discuss any questions you have with your health care provider. Document Revised: 10/20/2018 Document Reviewed: 11/19/2015 Elsevier Patient Education  [place].

## 2021-08-04 NOTE — Progress Notes (Signed)
LOW-RISK PREGNANCY VISIT Patient name: [name] MRN [id number]  Date of birth: 1990-10-15 Chief Complaint:   Routine Prenatal Visit  History of Present Illness:   [name] is a 31 y.o. G26P1011 female at [redacted]w[redacted]d with an Estimated Date of Delivery: 10/13/21 being seen today for ongoing management of a low-risk pregnancy.   Today she reports  cough x2wks, starting to improve, a little pain in Rt ear today . Contractions: Not present. Vag. Bleeding: None.  Movement: Present. denies leaking of fluid.  Depression screen [facility] 2/9 07/14/2021 04/06/2021  Decreased Interest 0 0  Down, Depressed, Hopeless 0 0  PHQ - 2 Score 0 0  Altered sleeping 0 0  Tired, decreased energy 0 0  Change in appetite 0 0  Feeling bad or failure about yourself  0 0  Trouble concentrating 0 0  Moving slowly or fidgety/restless 0 0  Suicidal thoughts 0 0  PHQ-9 Score 0 0     GAD 7 : Generalized Anxiety Score 07/14/2021 04/06/2021  Nervous, Anxious, on Edge 0 0  Control/stop worrying 0 0  Worry too much - different things 0 0  Trouble relaxing 0 0  Restless 0 0  Easily annoyed or irritable 0 0  Afraid - awful might happen 0 0  Total GAD 7 Score 0 0      Review of Systems:   Pertinent items are noted in HPI Denies abnormal vaginal discharge w/ itching/odor/irritation, headaches, visual changes, shortness of breath, chest pain, abdominal pain, severe nausea/vomiting, or problems with urination or bowel movements unless otherwise stated above. Pertinent History Reviewed:  Reviewed past medical,surgical, social, obstetrical and family history.  Reviewed problem list, medications and allergies. Physical Assessment:   Vitals:   08/04/21 1510  BP: 110/73  Pulse: 85  Weight: 187 lb 8 oz (85 kg)  Body mass index is 30.26 kg/m.        Physical Examination:   General appearance: Well appearing, and in no distress  Mental status: Alert, oriented to person, place, and time  Skin: Warm & dry  Ears:  bilateral wnl  Cardiovascular: Normal heart rate noted  Respiratory: Normal respiratory effort, no distress  Abdomen: Soft, gravid, nontender  Pelvic: Cervical exam deferred         Extremities: Edema: Trace  Fetal Status: Fetal Heart Rate (bpm): 138 Fundal Height: 30 cm Movement: Present    Chaperone: N/A   No results found for this or any previous visit (from the past 24 hour(s)).  Assessment & Plan:  1) Low-risk pregnancy G3P1011 at [redacted]w[redacted]d with an Estimated Date of Delivery: 10/13/21   2) Recent URI, Rt ear pain, no s/s infection, can try decongestant  3) H/O GHTN> ASA   Meds: No orders of the defined types were placed in this encounter.  Labs/procedures today: none  Plan:  Continue routine obstetrical care  Next visit: prefers in person    Reviewed: Preterm labor symptoms and general obstetric precautions including but not limited to vaginal bleeding, contractions, leaking of fluid and fetal movement were reviewed in detail with the patient.  All questions were answered. Does have home bp cuff. Office bp cuff given: not applicable. Check bp weekly, let us know if consistently >140 and/or >90.  Follow-up: Return in about 2 weeks (around 08/18/2021) for LROB, CNM, in person.  Future Appointments  Date Time Provider Department Center  08/17/2021  9:50 AM Cheral Marker, CNM [facility]    No orders of the defined  types were placed in this encounter.  Cheral Marker CNM, [facility] 08/04/2021 3:32 PM

## 2021-08-17 ENCOUNTER — Encounter: Payer: Self-pay | Admitting: Women's Health

## 2021-08-17 ENCOUNTER — Ambulatory Visit (INDEPENDENT_AMBULATORY_CARE_PROVIDER_SITE_OTHER): Payer: 59 | Admitting: Women's Health

## 2021-08-17 ENCOUNTER — Other Ambulatory Visit: Payer: Self-pay

## 2021-08-17 VITALS — BP 106/78 | HR 101 | Wt 188.0 lb

## 2021-08-17 DIAGNOSIS — Z3480 Encounter for supervision of other normal pregnancy, unspecified trimester: Secondary | ICD-10-CM | POA: Diagnosis not present

## 2021-08-17 DIAGNOSIS — Z3A31 31 weeks gestation of pregnancy: Secondary | ICD-10-CM | POA: Diagnosis not present

## 2021-08-17 DIAGNOSIS — Z3483 Encounter for supervision of other normal pregnancy, third trimester: Secondary | ICD-10-CM

## 2021-08-17 DIAGNOSIS — Z23 Encounter for immunization: Secondary | ICD-10-CM | POA: Diagnosis not present

## 2021-08-17 NOTE — Patient Instructions (Signed)
Kali, thank you for choosing our office today! We appreciate the opportunity to meet your healthcare needs. You may receive a short survey by mail, e-mail, or through MyChart. If you are happy with your care we would appreciate if you could take just a few minutes to complete the survey questions. We read all of your comments and take your feedback very seriously. Thank you again for choosing our office.  [facility]  [facility] ([address] [place], [place] [address]) Entrance C, located off of [address] Free 24/7 valet parking   CLASSES: Go to [email or web address] to register for classes (childbirth, breastfeeding, waterbirth, infant CPR, daddy bootcamp, etc.)  Call the office ([phone]) or go to [facility] if: You begin to have strong, frequent contractions Your water breaks.  Sometimes it is a big gush of fluid, sometimes it is just a trickle that keeps getting your panties wet or running down your legs You have vaginal bleeding.  It is normal to have a small amount of spotting if your cervix was checked.  You don't feel your baby moving like normal.  If you don't, get you something to eat and drink and lay down and focus on feeling your baby move.   If your baby is still not moving like normal, you should call the office or go to [facility].  Call the office ([phone]) or go to [facility] for these signs of pre-eclampsia: Severe headache that does not go away with Tylenol Visual changes- seeing spots, double, blurred vision Pain under your right breast or upper abdomen that does not go away with Tums or heartburn medicine Nausea and/or vomiting Severe swelling in your hands, feet, and face   Tdap Vaccine It is recommended that you get the Tdap vaccine during the third trimester of EACH pregnancy to help protect your baby from getting pertussis (whooping cough) 27-36 weeks is the BEST time to do  this so that you can pass the protection on to your baby. During pregnancy is better than after pregnancy, but if you are unable to get it during pregnancy it will be offered at the hospital.  You can get this vaccine with us, at the health department, your family doctor, or some local pharmacies Everyone who will be around your baby should also be up-to-date on their vaccines before the baby comes. Adults (who are not pregnant) only need 1 dose of Tdap during adulthood.   [place] [facility]/Family Doctors [facility] ([facility]): [address], [phone]           [facility]: [address], [phone]                [facility] ([facility]): [address], [phone] (call to ask if accepting patients) [facility]: [address], [place], [phone]    [place] [facility]/Family Doctors [facility] ([facility]): [address], [phone] [facility]: [address], [phone] [facility]: [address], [phone]  [place] [facility]  [facility] ([facility]): [phone] [facility]: [address], [phone]   [place] [facility]: [address], [phone]  [place] [facility]  [facility]: [address], [phone]  Home Blood Pressure Monitoring for Patients   Your provider has recommended that you check your   blood pressure (BP) at least once a week at home. If you do not have a blood pressure cuff at home, one will be provided for you. Contact your provider if you have not received your monitor within 1 week.  ° °Helpful Tips for Accurate Home Blood Pressure Checks  °Don't smoke, exercise, or drink caffeine 30 minutes before checking your BP °Use the restroom before checking your BP (a full bladder can raise your  pressure) °Relax in a comfortable upright chair °Feet on the ground °Left arm resting comfortably on a flat surface at the level of your heart °Legs uncrossed °Back supported °Sit quietly and don't talk °Place the cuff on your bare arm °Adjust snuggly, so that only two fingertips can fit between your skin and the top of the cuff °Check 2 readings separated by at least one minute °Keep a log of your BP readings °For a visual, please reference this diagram: http://ccnc.care/bpdiagram ° °Provider Name: [facility]     Phone: [phone] ° °Zone 1: ALL CLEAR  °Continue to monitor your symptoms:  °BP reading is less than 140 (top number) or less than 90 (bottom number)  °No right upper stomach pain °No headaches or seeing spots °No feeling nauseated or throwing up °No swelling in face and hands ° °Zone 2: CAUTION °Call your doctor's office for any of the following:  °BP reading is greater than 140 (top number) or greater than 90 (bottom number)  °Stomach pain under your ribs in the middle or right side °Headaches or seeing spots °Feeling nauseated or throwing up °Swelling in face and hands ° °Zone 3: EMERGENCY  °Seek immediate medical care if you have any of the following:  °BP reading is greater than160 (top number) or greater than 110 (bottom number) °Severe headaches not improving with Tylenol °Serious difficulty catching your breath °Any worsening symptoms from Zone 2  °Preterm Labor and Birth Information ° °The normal length of a pregnancy is 39-41 weeks. Preterm labor is when labor starts before 37 completed weeks of pregnancy. °What are the risk factors for preterm labor? °Preterm labor is more likely to occur in women who: °Have certain infections during pregnancy such as a bladder infection, sexually transmitted infection, or infection inside the uterus (chorioamnionitis). °Have a shorter-than-normal cervix. °Have gone into preterm labor before. °Have had surgery on their cervix. °Are younger than age 17  or older than age 35. °Are African American. °Are pregnant with twins or multiple babies (multiple gestation). °Take street drugs or smoke while pregnant. °Do not gain enough weight while pregnant. °Became pregnant shortly after having been pregnant. °What are the symptoms of preterm labor? °Symptoms of preterm labor include: °Cramps similar to those that can happen during a menstrual period. The cramps may happen with diarrhea. °Pain in the abdomen or lower back. °Regular uterine contractions that may feel like tightening of the abdomen. °A feeling of increased pressure in the pelvis. °Increased watery or bloody mucus discharge from the vagina. °Water breaking (ruptured amniotic sac). °Why is it important to recognize signs of preterm labor? °It is important to recognize signs of preterm labor because babies who are born prematurely may not be fully developed. This can put them at an increased risk for: °Long-term (chronic) heart and lung problems. °Difficulty immediately after birth with regulating body systems, including blood sugar, body temperature, heart rate, and breathing rate. °Bleeding in the brain. °Cerebral palsy. °Learning difficulties. °Death. °These risks are highest for babies who are born before 34 weeks   of pregnancy. How is preterm labor treated? Treatment depends on the length of your pregnancy, your condition, and the health of your baby. It may involve: Having a stitch (suture) placed in your cervix to prevent your cervix from opening too early (cerclage). Taking or being given medicines, such as: Hormone medicines. These may be given early in pregnancy to help support the pregnancy. Medicine to stop contractions. Medicines to help mature the babys lungs. These may be prescribed if the risk of delivery is high. Medicines to prevent your baby from developing cerebral palsy. If the labor happens before 34 weeks of pregnancy, you may need to stay in the hospital. What should I do if I  think I am in preterm labor? If you think that you are going into preterm labor, call your health care provider right away. How can I prevent preterm labor in future pregnancies? To increase your chance of having a full-term pregnancy: Do not use any tobacco products, such as cigarettes, chewing tobacco, and e-cigarettes. If you need help quitting, ask your health care provider. Do not use street drugs or medicines that have not been prescribed to you during your pregnancy. Talk with your health care provider before taking any herbal supplements, even if you have been taking them regularly. Make sure you gain a healthy amount of weight during your pregnancy. Watch for infection. If you think that you might have an infection, get it checked right away. Make sure to tell your health care provider if you have gone into preterm labor before. This information is not intended to replace advice given to you by your health care provider. Make sure you discuss any questions you have with your health care provider. Document Revised: 10/20/2018 Document Reviewed: 11/19/2015 Elsevier Patient Education  [place].

## 2021-08-17 NOTE — Progress Notes (Signed)
° ° °  LOW-RISK PREGNANCY VISIT Patient name: [name] MRN [id number]  Date of birth: 09-29-1990 Chief Complaint:   Routine Prenatal Visit  History of Present Illness:   ZIYON CEDOTAL is a 31 y.o. G82P1011 female at [redacted]w[redacted]d with an Estimated Date of Delivery: 10/13/21 being seen today for ongoing management of a low-risk pregnancy.   Today she reports no complaints. Contractions: Irritability. Vag. Bleeding: None.  Movement: Present. denies leaking of fluid.  Depression screen [facility] 2/9 07/14/2021 04/06/2021  Decreased Interest 0 0  Down, Depressed, Hopeless 0 0  PHQ - 2 Score 0 0  Altered sleeping 0 0  Tired, decreased energy 0 0  Change in appetite 0 0  Feeling bad or failure about yourself  0 0  Trouble concentrating 0 0  Moving slowly or fidgety/restless 0 0  Suicidal thoughts 0 0  PHQ-9 Score 0 0     GAD 7 : Generalized Anxiety Score 07/14/2021 04/06/2021  Nervous, Anxious, on Edge 0 0  Control/stop worrying 0 0  Worry too much - different things 0 0  Trouble relaxing 0 0  Restless 0 0  Easily annoyed or irritable 0 0  Afraid - awful might happen 0 0  Total GAD 7 Score 0 0      Review of Systems:   Pertinent items are noted in HPI Denies abnormal vaginal discharge w/ itching/odor/irritation, headaches, visual changes, shortness of breath, chest pain, abdominal pain, severe nausea/vomiting, or problems with urination or bowel movements unless otherwise stated above. Pertinent History Reviewed:  Reviewed past medical,surgical, social, obstetrical and family history.  Reviewed problem list, medications and allergies. Physical Assessment:   Vitals:   08/17/21 0951  BP: 106/78  Pulse: (!) 101  Weight: 188 lb (85.3 kg)  Body mass index is 30.34 kg/m.        Physical Examination:   General appearance: Well appearing, and in no distress  Mental status: Alert, oriented to person, place, and time  Skin: Warm & dry  Cardiovascular: Normal heart rate noted  Respiratory:  Normal respiratory effort, no distress  Abdomen: Soft, gravid, nontender  Pelvic: Cervical exam deferred         Extremities: Edema: Trace  Fetal Status: Fetal Heart Rate (bpm): 158 Fundal Height: 31 cm Movement: Present    Chaperone: N/A   No results found for this or any previous visit (from the past 24 hour(s)).  Assessment & Plan:  1) Low-risk pregnancy G3P1011 at [redacted]w[redacted]d with an Estimated Date of Delivery: 10/13/21   2) H/O GHTN, ASA   Meds: No orders of the defined types were placed in this encounter.  Labs/procedures today: tdap  Plan:  Continue routine obstetrical care  Next visit: prefers in person    Reviewed: Preterm labor symptoms and general obstetric precautions including but not limited to vaginal bleeding, contractions, leaking of fluid and fetal movement were reviewed in detail with the patient.  All questions were answered. Does have home bp cuff. Office bp cuff given: not applicable. Check bp weekly, let us know if consistently >140 and/or >90.  Follow-up: Return in about 2 weeks (around 08/31/2021) for LROB, CNM, in person.  No future appointments.  Orders Placed This Encounter  Procedures   Tdap vaccine greater than or equal to 7yo IM   Cheral Marker CNM, [facility] 08/17/2021 10:05 AM

## 2021-09-02 ENCOUNTER — Other Ambulatory Visit: Payer: Self-pay

## 2021-09-02 ENCOUNTER — Ambulatory Visit (INDEPENDENT_AMBULATORY_CARE_PROVIDER_SITE_OTHER): Payer: 59 | Admitting: Advanced Practice Midwife

## 2021-09-02 VITALS — BP 118/76 | HR 96 | Wt 192.0 lb

## 2021-09-02 DIAGNOSIS — Z3A34 34 weeks gestation of pregnancy: Secondary | ICD-10-CM

## 2021-09-02 DIAGNOSIS — Z3483 Encounter for supervision of other normal pregnancy, third trimester: Secondary | ICD-10-CM

## 2021-09-02 NOTE — Progress Notes (Signed)
° °  LOW-RISK PREGNANCY VISIT Patient name: [name] MRN [id number]  Date of birth: 1991/05/24 Chief Complaint:   Routine Prenatal Visit  History of Present Illness:   [name] is a 31 y.o. G40P1011 female at [redacted]w[redacted]d with an Estimated Date of Delivery: 10/13/21 being seen today for ongoing management of a low-risk pregnancy.  Today she reports  some low abd pressure/cramping . Contractions: Irritability. Vag. Bleeding: None.  Movement: Present. denies leaking of fluid. Review of Systems:   Pertinent items are noted in HPI Denies abnormal vaginal discharge w/ itching/odor/irritation, headaches, visual changes, shortness of breath, chest pain, abdominal pain, severe nausea/vomiting, or problems with urination or bowel movements unless otherwise stated above. Pertinent History Reviewed:  Reviewed past medical,surgical, social, obstetrical and family history.  Reviewed problem list, medications and allergies. Physical Assessment:   Vitals:   09/02/21 1535  BP: 118/76  Pulse: 96  Weight: 192 lb (87.1 kg)  Body mass index is 30.99 kg/m.        Physical Examination:   General appearance: Well appearing, and in no distress  Mental status: Alert, oriented to person, place, and time  Skin: Warm & dry  Cardiovascular: Normal heart rate noted  Respiratory: Normal respiratory effort, no distress  Abdomen: Soft, gravid, nontender  Pelvic: Cervical exam deferred         Extremities: Edema: Trace  Fetal Status: Fetal Heart Rate (bpm): 130 Fundal Height: 33 cm Movement: Present    No results found for this or any previous visit (from the past 24 hour(s)).  Assessment & Plan:  1) Low-risk pregnancy G3P1011 at [redacted]w[redacted]d with an Estimated Date of Delivery: 10/13/21   2) Hx gHTN, bASA; BP stable   Meds: No orders of the defined types were placed in this encounter.  Labs/procedures today: none  Plan:  Continue routine obstetrical care   Reviewed: Preterm labor symptoms and general  obstetric precautions including but not limited to vaginal bleeding, contractions, leaking of fluid and fetal movement were reviewed in detail with the patient.  All questions were answered. Has home bp cuff. Check bp weekly, let us know if >140/90.   Follow-up: Return in about 2 weeks (around 09/16/2021) for LROB, in person; GBS/cultures.  No orders of the defined types were placed in this encounter.  Arabella Merles CNM 09/02/2021 4:09 PM

## 2021-09-02 NOTE — Patient Instructions (Signed)
[name], thank you for choosing our office today! We appreciate the opportunity to meet your healthcare needs. You may receive a short survey by mail, e-mail, or through MyChart. If you are happy with your care we would appreciate if you could take just a few minutes to complete the survey questions. We read all of your comments and take your feedback very seriously. Thank you again for choosing our office.  [facility] [place] ([address] [place], [place] [address]) Entrance C, located off of [address] Free 24/7 valet parking   CLASSES: Go to [email or web address] to register for classes (childbirth, breastfeeding, waterbirth, infant CPR, daddy bootcamp, etc.)  Call the office ([phone]) or go to [facility] if: You begin to have strong, frequent contractions Your water breaks.  Sometimes it is a big gush of fluid, sometimes it is just a trickle that keeps getting your panties wet or running down your legs You have vaginal bleeding.  It is normal to have a small amount of spotting if your cervix was checked.  You don't feel your baby moving like normal.  If you don't, get you something to eat and drink and lay down and focus on feeling your baby move.   If your baby is still not moving like normal, you should call the office or go to [facility].  Call the office ([phone]) or go to [facility] for these signs of pre-eclampsia: Severe headache that does not go away with Tylenol Visual changes- seeing spots, double, blurred vision Pain under your right breast or upper abdomen that does not go away with Tums or heartburn medicine Nausea and/or vomiting Severe swelling in your hands, feet, and face   Tdap Vaccine It is recommended that you get the Tdap vaccine during the third trimester of EACH pregnancy to help protect your baby from getting pertussis (whooping cough) 27-36 weeks is the BEST time to do  this so that you can pass the protection on to your baby. During pregnancy is better than after pregnancy, but if you are unable to get it during pregnancy it will be offered at the hospital.  You can get this vaccine with us, at the health department, your family doctor, or some local pharmacies Everyone who will be around your baby should also be up-to-date on their vaccines before the baby comes. Adults (who are not pregnant) only need 1 dose of Tdap during adulthood.   [place] Pediatricians/Family Doctors [facility] ([facility]): [address], [phone]           [facility]: [address], [phone]                [facility] ([facility]): [address], [phone] (call to ask if accepting patients) [facility]: [address], [place], [phone]    [place] Pediatricians/Family Doctors [facility] ([facility]): [address], [phone] [facility]: [address], [phone] [facility]: [address], [phone]  [place] [facility]  [facility] ([facility]): [phone] [facility]: [address], [phone]   [place] [facility]: [address], [phone]  [place] [facility]  [facility]: [address], [phone]  Home Blood Pressure Monitoring for Patients   Your provider has recommended that you check your   blood pressure (BP) at least once a week at home. If you do not have a blood pressure cuff at home, one will be provided for you. Contact your provider if you have not received your monitor within 1 week.   Helpful Tips for Accurate Home Blood Pressure Checks  Don't smoke, exercise, or drink caffeine 30 minutes before checking your BP Use the restroom before checking your BP (a full bladder can raise your  pressure) Relax in a comfortable upright chair Feet on the ground Left arm resting comfortably on a flat surface at the level of your heart Legs uncrossed Back supported Sit quietly and don't talk Place the cuff on your bare arm Adjust snuggly, so that only two fingertips can fit between your skin and the top of the cuff Check 2 readings separated by at least one minute Keep a log of your BP readings For a visual, please reference this diagram: http://ccnc.care/bpdiagram  Provider Name: [facility]     Phone: [phone]  Zone 1: ALL CLEAR  Continue to monitor your symptoms:  BP reading is less than 140 (top number) or less than 90 (bottom number)  No right upper stomach pain No headaches or seeing spots No feeling nauseated or throwing up No swelling in face and hands  Zone 2: CAUTION Call your doctor's office for any of the following:  BP reading is greater than 140 (top number) or greater than 90 (bottom number)  Stomach pain under your ribs in the middle or right side Headaches or seeing spots Feeling nauseated or throwing up Swelling in face and hands  Zone 3: EMERGENCY  Seek immediate medical care if you have any of the following:  BP reading is greater than160 (top number) or greater than 110 (bottom number) Severe headaches not improving with Tylenol Serious difficulty catching your breath Any worsening symptoms from Zone 2   Third Trimester of Pregnancy The third trimester is from week 29 through week 42, months 7 through 9. The third trimester is a time when the fetus is growing rapidly. At the end of the ninth month, the fetus is about 20 inches in length and weighs 6-10 pounds.  BODY CHANGES Your body goes through many changes during pregnancy. The changes vary from woman to woman.  Your weight will continue to increase. You can expect to gain 25-35 pounds (11-16 kg) by the end of the pregnancy. You may begin to get stretch marks on your hips, abdomen,  and breasts. You may urinate more often because the fetus is moving lower into your pelvis and pressing on your bladder. You may develop or continue to have heartburn as a result of your pregnancy. You may develop constipation because certain hormones are causing the muscles that push waste through your intestines to slow down. You may develop hemorrhoids or swollen, bulging veins (varicose veins). You may have pelvic pain because of the weight gain and pregnancy hormones relaxing your joints between the bones in your pelvis. Backaches may result from overexertion of the muscles supporting your posture. You may have changes in your hair. These can include thickening of your hair, rapid growth, and changes in texture. Some women also have hair loss during or after pregnancy, or hair that feels dry or thin. Your hair will most likely return to normal after your baby is born. Your breasts will continue to grow and be tender. A yellow discharge may leak from your breasts called colostrum. Your belly button may stick out. You may   feel short of breath because of your expanding uterus. You may notice the fetus "dropping," or moving lower in your abdomen. You may have a bloody mucus discharge. This usually occurs a few days to a week before labor begins. Your cervix becomes thin and soft (effaced) near your due date. WHAT TO EXPECT AT YOUR PRENATAL EXAMS  You will have prenatal exams every 2 weeks until week 36. Then, you will have weekly prenatal exams. During a routine prenatal visit: You will be weighed to make sure you and the fetus are growing normally. Your blood pressure is taken. Your abdomen will be measured to track your baby's growth. The fetal heartbeat will be listened to. Any test results from the previous visit will be discussed. You may have a cervical check near your due date to see if you have effaced. At around 36 weeks, your caregiver will check your cervix. At the same time, your  caregiver will also perform a test on the secretions of the vaginal tissue. This test is to determine if a type of bacteria, Group B streptococcus, is present. Your caregiver will explain this further. Your caregiver may ask you: What your birth plan is. How you are feeling. If you are feeling the baby move. If you have had any abnormal symptoms, such as leaking fluid, bleeding, severe headaches, or abdominal cramping. If you have any questions. Other tests or screenings that may be performed during your third trimester include: Blood tests that check for low iron levels (anemia). Fetal testing to check the health, activity level, and growth of the fetus. Testing is done if you have certain medical conditions or if there are problems during the pregnancy. FALSE LABOR You may feel small, irregular contractions that eventually go away. These are called Braxton Hicks contractions, or false labor. Contractions may last for hours, days, or even weeks before true labor sets in. If contractions come at regular intervals, intensify, or become painful, it is best to be seen by your caregiver.  SIGNS OF LABOR  Menstrual-like cramps. Contractions that are 5 minutes apart or less. Contractions that start on the top of the uterus and spread down to the lower abdomen and back. A sense of increased pelvic pressure or back pain. A watery or bloody mucus discharge that comes from the vagina. If you have any of these signs before the 37th week of pregnancy, call your caregiver right away. You need to go to the hospital to get checked immediately. HOME CARE INSTRUCTIONS  Avoid all smoking, herbs, alcohol, and unprescribed drugs. These chemicals affect the formation and growth of the baby. Follow your caregiver's instructions regarding medicine use. There are medicines that are either safe or unsafe to take during pregnancy. Exercise only as directed by your caregiver. Experiencing uterine cramps is a good sign to  stop exercising. Continue to eat regular, healthy meals. Wear a good support bra for breast tenderness. Do not use hot tubs, steam rooms, or saunas. Wear your seat belt at all times when driving. Avoid raw meat, uncooked cheese, cat litter boxes, and soil used by cats. These carry germs that can cause birth defects in the baby. Take your prenatal vitamins. Try taking a stool softener (if your caregiver approves) if you develop constipation. Eat more high-fiber foods, such as fresh vegetables or fruit and whole grains. Drink plenty of fluids to keep your urine clear or pale yellow. Take warm sitz baths to soothe any pain or discomfort caused by hemorrhoids. Use hemorrhoid cream if   your caregiver approves. If you develop varicose veins, wear support hose. Elevate your feet for 15 minutes, 3-4 times a day. Limit salt in your diet. Avoid heavy lifting, wear low heal shoes, and practice good posture. Rest a lot with your legs elevated if you have leg cramps or low back pain. Visit your dentist if you have not gone during your pregnancy. Use a soft toothbrush to brush your teeth and be gentle when you floss. A sexual relationship may be continued unless your caregiver directs you otherwise. Do not travel far distances unless it is absolutely necessary and only with the approval of your caregiver. Take prenatal classes to understand, practice, and ask questions about the labor and delivery. Make a trial run to the hospital. Pack your hospital bag. Prepare the baby's nursery. Continue to go to all your prenatal visits as directed by your caregiver. SEEK MEDICAL CARE IF: You are unsure if you are in labor or if your water has broken. You have dizziness. You have mild pelvic cramps, pelvic pressure, or nagging pain in your abdominal area. You have persistent nausea, vomiting, or diarrhea. You have a bad smelling vaginal discharge. You have pain with urination. SEEK IMMEDIATE MEDICAL CARE IF:  You  have a fever. You are leaking fluid from your vagina. You have spotting or bleeding from your vagina. You have severe abdominal cramping or pain. You have rapid weight loss or gain. You have shortness of breath with chest pain. You notice sudden or extreme swelling of your face, hands, ankles, feet, or legs. You have not felt your baby move in over an hour. You have severe headaches that do not go away with medicine. You have vision changes. Document Released: 06/22/2001 Document Revised: 07/03/2013 Document Reviewed: 08/29/2012 ExitCare Patient Information 2015 [employer]. This information is not intended to replace advice given to you by your health care provider. Make sure you discuss any questions you have with your health care provider.       

## 2021-09-09 ENCOUNTER — Encounter: Payer: Self-pay | Admitting: Women's Health

## 2021-09-16 ENCOUNTER — Ambulatory Visit (INDEPENDENT_AMBULATORY_CARE_PROVIDER_SITE_OTHER): Payer: 59 | Admitting: Advanced Practice Midwife

## 2021-09-16 ENCOUNTER — Other Ambulatory Visit (HOSPITAL_COMMUNITY)
Admission: RE | Admit: 2021-09-16 | Discharge: 2021-09-16 | Disposition: A | Discharge status: Home / Self Care | Payer: 59 | Source: Ambulatory Visit | Attending: Advanced Practice Midwife | Admitting: Advanced Practice Midwife

## 2021-09-16 ENCOUNTER — Other Ambulatory Visit: Payer: Self-pay

## 2021-09-16 VITALS — BP 117/85 | HR 91 | Wt 194.0 lb

## 2021-09-16 DIAGNOSIS — Z3483 Encounter for supervision of other normal pregnancy, third trimester: Secondary | ICD-10-CM | POA: Diagnosis not present

## 2021-09-16 DIAGNOSIS — Z3A36 36 weeks gestation of pregnancy: Secondary | ICD-10-CM | POA: Diagnosis not present

## 2021-09-16 NOTE — Progress Notes (Signed)
? ?  LOW-RISK PREGNANCY VISIT ?Patient name: [name] MRN [id number]  Date of birth: 1991/04/06 ?Chief Complaint:   ?Routine Prenatal Visit ? ?History of Present Illness:   ?[name] is a 31 y.o. G15P1011 female at [redacted]w[redacted]d with an Estimated Date of Delivery: 10/13/21 being seen today for ongoing management of a low-risk pregnancy.  ?Today she reports  more freq BH ctx as well as side/back discomfort . Contractions: Irritability. Vag. Bleeding: None.  Movement: Present. denies leaking of fluid. ?Review of Systems:   ?Pertinent items are noted in HPI ?Denies abnormal vaginal discharge w/ itching/odor/irritation, headaches, visual changes, shortness of breath, chest pain, abdominal pain, severe nausea/vomiting, or problems with urination or bowel movements unless otherwise stated above. ?Pertinent History Reviewed:  ?Reviewed past medical,surgical, social, obstetrical and family history.  ?Reviewed problem list, medications and allergies. ?Physical Assessment:  ? ?Vitals:  ? 09/16/21 1534  ?BP: 117/85  ?Pulse: 91  ?Weight: 194 lb (88 kg)  ?Body mass index is 31.31 kg/m?. ?  ?     Physical Examination:  ? General appearance: Well appearing, and in no distress ? Mental status: Alert, oriented to person, place, and time ? Skin: Warm & dry ? Cardiovascular: Normal heart rate noted ? Respiratory: Normal respiratory effort, no distress ? Abdomen: Soft, gravid, nontender ? Pelvic: Cervical exam performed  Dilation: Closed Effacement (%): Thick Station: -3 ? Extremities: Edema: Trace ? ?Fetal Status: Fetal Heart Rate (bpm): 135 Fundal Height: 35 cm Movement: Present Presentation: Vertex ? ?No results found for this or any previous visit (from the past 24 hour(s)).  ?Assessment & Plan:  ?1) Low-risk pregnancy G3P1011 at [redacted]w[redacted]d with an Estimated Date of Delivery: 10/13/21  ? ?2) Hx gHTN, BP stable ?  ?Meds: No orders of the defined types were placed in this encounter. ? ?Labs/procedures today: GBS/cultures; SVE ? ?Plan:   Continue routine obstetrical care  ? ?Reviewed: Term labor symptoms and general obstetric precautions including but not limited to vaginal bleeding, contractions, leaking of fluid and fetal movement were reviewed in detail with the patient.  All questions were answered. Has home bp cuff.  Check bp weekly, let us know if >140/90.  ? ?Follow-up: Return for As scheduled.(has weekly visits scheduled out) ? ?Orders Placed This Encounter  ?Procedures  ? Culture, beta strep (group b only)  ? ?Arabella Merles CNM ?09/16/2021 ?3:59 PM  ?

## 2021-09-17 DIAGNOSIS — Z029 Encounter for administrative examinations, unspecified: Secondary | ICD-10-CM

## 2021-09-18 LAB — CERVICOVAGINAL ANCILLARY ONLY
Chlamydia: NEGATIVE
Comment: NEGATIVE
Comment: NORMAL
Neisseria Gonorrhea: NEGATIVE

## 2021-09-20 ENCOUNTER — Telehealth: Payer: Self-pay | Admitting: Obstetrics & Gynecology

## 2021-09-20 LAB — CULTURE, BETA STREP (GROUP B ONLY): Strep Gp B Culture: NEGATIVE

## 2021-09-20 NOTE — Telephone Encounter (Signed)
Phone call from [facility].  Patient's BP 123/92.  Left detailed message- no immediate action needed.  Should she note headache, blurry vision- recheck BP.  If above 140/90 call answering service or suspect [facility] will call back OR if acute concern go to [facility] ? ?[name], DO ?Attending Obstetrician & Gynecologist, Faculty Practice ?[facility] [employer], [facility] ? ? ?

## 2021-09-21 ENCOUNTER — Inpatient Hospital Stay (EMERGENCY_DEPARTMENT_HOSPITAL)
Admission: AD | Admit: 2021-09-21 | Discharge: 2021-09-21 | Disposition: A | Discharge status: Home / Self Care | Payer: 59 | Source: Home / Self Care | Attending: Obstetrics and Gynecology | Admitting: Obstetrics and Gynecology

## 2021-09-21 ENCOUNTER — Other Ambulatory Visit: Payer: Self-pay

## 2021-09-21 ENCOUNTER — Encounter (HOSPITAL_COMMUNITY): Payer: Self-pay | Admitting: Obstetrics and Gynecology

## 2021-09-21 ENCOUNTER — Telehealth: Payer: Self-pay

## 2021-09-21 DIAGNOSIS — O09213 Supervision of pregnancy with history of pre-term labor, third trimester: Secondary | ICD-10-CM | POA: Insufficient documentation

## 2021-09-21 DIAGNOSIS — O212 Late vomiting of pregnancy: Secondary | ICD-10-CM | POA: Insufficient documentation

## 2021-09-21 DIAGNOSIS — Z20822 Contact with and (suspected) exposure to covid-19: Secondary | ICD-10-CM | POA: Diagnosis not present

## 2021-09-21 DIAGNOSIS — Z3A36 36 weeks gestation of pregnancy: Secondary | ICD-10-CM | POA: Insufficient documentation

## 2021-09-21 DIAGNOSIS — R519 Headache, unspecified: Secondary | ICD-10-CM | POA: Insufficient documentation

## 2021-09-21 DIAGNOSIS — R030 Elevated blood-pressure reading, without diagnosis of hypertension: Secondary | ICD-10-CM | POA: Insufficient documentation

## 2021-09-21 DIAGNOSIS — O134 Gestational [pregnancy-induced] hypertension without significant proteinuria, complicating childbirth: Secondary | ICD-10-CM | POA: Diagnosis not present

## 2021-09-21 DIAGNOSIS — Z3A37 37 weeks gestation of pregnancy: Secondary | ICD-10-CM | POA: Diagnosis not present

## 2021-09-21 DIAGNOSIS — O26893 Other specified pregnancy related conditions, third trimester: Secondary | ICD-10-CM | POA: Insufficient documentation

## 2021-09-21 DIAGNOSIS — Z7982 Long term (current) use of aspirin: Secondary | ICD-10-CM | POA: Diagnosis not present

## 2021-09-21 LAB — COMPREHENSIVE METABOLIC PANEL
ALT: 11 U/L (ref 0–44)
AST: 20 U/L (ref 15–41)
Albumin: 2.7 g/dL — ABNORMAL LOW (ref 3.5–5.0)
Alkaline Phosphatase: 101 U/L (ref 38–126)
Anion gap: 7 (ref 5–15)
BUN: 5 mg/dL — ABNORMAL LOW (ref 6–20)
CO2: 22 mmol/L (ref 22–32)
Calcium: 8.8 mg/dL — ABNORMAL LOW (ref 8.9–10.3)
Chloride: 105 mmol/L (ref 98–111)
Creatinine, Ser: 0.57 mg/dL (ref 0.44–1.00)
GFR, Estimated: >60 mL/min (ref >60)
Glucose, Bld: 92 mg/dL (ref 70–99)
Potassium: 3.5 mmol/L (ref 3.5–5.1)
Sodium: 134 mmol/L — ABNORMAL LOW (ref 135–145)
Total Bilirubin: 0.5 mg/dL (ref 0.3–1.2)
Total Protein: 6.5 g/dL (ref 6.5–8.1)

## 2021-09-21 LAB — URINALYSIS, ROUTINE W REFLEX MICROSCOPIC
Bilirubin Urine: NEGATIVE
Glucose, UA: NEGATIVE mg/dL
Hgb urine dipstick: NEGATIVE
Ketones, ur: 5 mg/dL — AB
Nitrite: NEGATIVE
Protein, ur: NEGATIVE mg/dL
Specific Gravity, Urine: 1.01 (ref 1.005–1.030)
pH: 6 (ref 5.0–8.0)

## 2021-09-21 LAB — PROTEIN / CREATININE RATIO, URINE
Creatinine, Urine: 85.81 mg/dL
Protein Creatinine Ratio: 0.09 mg/mg{Cre} (ref 0.00–0.15)
Total Protein, Urine: 8 mg/dL

## 2021-09-21 LAB — CBC
HCT: 34.9 % — ABNORMAL LOW (ref 36.0–46.0)
Hemoglobin: 12 g/dL (ref 12.0–15.0)
MCH: 31.3 pg (ref 26.0–34.0)
MCHC: 34.4 g/dL (ref 30.0–36.0)
MCV: 90.9 fL (ref 80.0–100.0)
Platelets: 183 10*3/uL (ref 150–400)
RBC: 3.84 MIL/uL — ABNORMAL LOW (ref 3.87–5.11)
RDW: 14.1 % (ref 11.5–15.5)
WBC: 9.8 10*3/uL (ref 4.0–10.5)
nRBC: 0 % (ref 0.0–0.2)

## 2021-09-21 MED ORDER — ACETAMINOPHEN 500 MG PO TABS
1000.0000 mg | ORAL_TABLET | Freq: Once | ORAL | Status: AC
Start: 2021-09-21 — End: 2021-09-21
  Administered 2021-09-21: 1000 mg via ORAL
  Filled 2021-09-21: qty 2

## 2021-09-21 NOTE — Telephone Encounter (Signed)
Pt called with c/o constant headache since Friday night. She has been taking her BP and her latest reading at 1630 was 117/90. She c/o chest pressure when her BP is elevated with diastolics in the 90's. She has had constant nausea with vomiting x 2. She has intermittent spots in her vision. Pt unable to take meds for N/V or headache. Joellyn Haff advised that she be evaluated at [facility]. Pt confirmed understanding. ?

## 2021-09-21 NOTE — [facility] Note (Signed)
.  [name] is a 31 y.o. at [redacted]w[redacted]d here in [facility] reporting: elevated BP at home and headache that started on Friday night. She states she took tylenol a few days ago but threw it up and hasn't taken anything since. Has also had nausea. Denies VB or LOF. Endorses good fetal movement.  ? ?Pain score: 6 ?Vitals:  ? 09/21/21 1841  ?BP: 130/84  ?Pulse: 100  ?Resp: 16  ?Temp: 98.2 ?F (36.8 ?C)  ?SpO2: 100%  ?   ?FHT:147 ?Lab orders placed from triage:  UA ?

## 2021-09-21 NOTE — MAU Provider Note (Addendum)
History     CSN: [id number]  Arrival date and time: 09/21/21 1816   None     Chief Complaint  Patient presents with   Headache   Hypertension   G3 P1011 at 36.6 presenting for elevated BP at home, headache and nausea/vomiting since Friday.   Says that her blood pressures have been diastolic in the 90s since Friday and the blood pressure she took before coming here was 99 diastolic.  She has been having some vision changes as well with floaters during these headaches.  She says that she has headaches since Friday which are new and have not occurred during her pregnancy.  She took Tylenol on Saturday but vomited this up.  She says that she has had some right-sided abdominal pain upper and lower late last week but denies any currently.  She says that she has a history of gestational hypertension on her last pregnancy but denies any preeclampsia at that time.  Did receive call from [facility] on Saturday where patient's BP 123/92.  She says that she has also had nausea/vomiting.  She vomited twice, 1 time Saturday 5 AM, and 1 time at midnight.  She says that it was mostly for stomach contact/food she had eaten.  She has been able to hold food and liquids down.  Denies any fevers, diarrhea, sick contacts, URI symptoms.  Had a complaint of chest pressure intermittently only with headaches/extreme nausea and comes and goes.  Pressure does not radiate down the arm but sometimes has upper back pain.  She does not have any shortness of breath at rest.  Denies any chest pain or pressure currently.   Denies any leaking of fluid, vaginal bleeding.  Has positive fetal movement.  Contractions are not regular.   Does have history of preterm labor at 26 weeks however this child was born at 98.2.  Headache  Associated symptoms include nausea and vomiting. Pertinent negatives include no abdominal pain, fever, rhinorrhea or sore throat. Her past medical history is significant for hypertension.   Hypertension Associated symptoms include headaches. Pertinent negatives include no chest pain or shortness of breath.   OB History     Gravida  3   Para  1   Term  1   Preterm  0   AB  1   Living  1      SAB  1   IAB  0   Ectopic  0   Multiple  0   Live Births  1           Past Medical History:  Diagnosis Date   Gestational hypertension, antepartum 06/06/2012   PONV (postoperative nausea and vomiting)    Pyelonephritis    Urinary tract infection     Past Surgical History:  Procedure Laterality Date   Bladder stem stretcher     DILATION AND EVACUATION N/A 03/30/2016   Procedure: DILATATION AND EVACUATION--suction D&C;  Surgeon: Lazaro Arms, MD;  Location: [facility];  Service: Gynecology;  Laterality: N/A;   GALLBLADDER SURGERY     TONSILLECTOMY      Family History  Problem Relation Age of Onset   Breast cancer Mother    Leukemia Paternal Grandfather    Other Neg Hx     Social History   Tobacco Use   Smoking status: Never   Smokeless tobacco: Never  Vaping Use   Vaping Use: Never used  Substance Use Topics   Alcohol use: No   Drug use: No  Allergies: No Known Allergies  Medications Prior to Admission  Medication Sig Dispense Refill Last Dose   aspirin 81 MG EC tablet Take 2 tablets (162 mg total) by mouth daily. Swallow whole. 180 tablet 2 Past Week   doxylamine, Sleep, (UNISOM) 25 MG tablet Take 25 mg by mouth at bedtime as needed.   09/20/2021   Prenatal MV & Min w/FA-DHA (PRENATAL ADULT GUMMY/DHA/FA PO) Take by mouth. Takes 2 daily   09/20/2021   pyridOXINE (VITAMIN B-6) 100 MG tablet Take 100 mg by mouth 2 (two) times daily.   09/20/2021   budesonide-formoterol (SYMBICORT) 160-4.5 MCG/ACT inhaler Inhale 2 puffs into the lungs 2 (two) times daily. Rinse mouth with water after each use (Patient taking differently: Inhale 2 puffs into the lungs as needed. Rinse mouth with water after each use) 1 each 0    Calcium Carbonate Antacid (TUMS PO)  Take by mouth.      ondansetron (ZOFRAN ODT) 4 MG disintegrating tablet 4mg  ODT q4 hours prn nausea/vomit 30 tablet 0    promethazine (PHENERGAN) 25 MG tablet Take 1 tablet (25 mg total) by mouth every 6 (six) hours as needed for nausea or vomiting. 30 tablet 0     Review of Systems  Constitutional:  Negative for fever.  HENT:  Negative for rhinorrhea and sore throat.   Eyes:  Positive for visual disturbance.  Respiratory:  Negative for shortness of breath.   Cardiovascular:  Negative for chest pain and leg swelling.  Gastrointestinal:  Positive for nausea and vomiting. Negative for abdominal pain, constipation and diarrhea.  Neurological:  Positive for headaches.  Physical Exam   Blood pressure (!) 134/95, pulse 91, temperature 98.2 F (36.8 C), temperature source Oral, resp. rate 16, last menstrual period 01/13/2021, SpO2 97 %.  Physical Exam Constitutional:      General: She is not in acute distress.    Appearance: She is normal weight. She is not toxic-appearing.  HENT:     Head: Normocephalic and atraumatic.     Mouth/Throat:     Mouth: Mucous membranes are moist.  Cardiovascular:     Rate and Rhythm: Normal rate and regular rhythm.     Heart sounds: Normal heart sounds. No murmur heard.   No friction rub. No gallop.  Pulmonary:     Effort: Pulmonary effort is normal. No respiratory distress.     Breath sounds: Normal breath sounds.  Abdominal:     Tenderness: There is no abdominal tenderness. There is no guarding.  Musculoskeletal:        General: No swelling or tenderness.  Neurological:     Mental Status: She is alert.   NST:  Baseline: 140 bpm, Variability: Good {> 6 bpm), Accelerations: Reactive, and Decelerations: Absent    Results for orders placed or performed during the hospital encounter of 09/21/21 (from the past 24 hour(s))  Urinalysis, Routine w reflex microscopic Urine, Clean Catch     Status: Abnormal   Collection Time: 09/21/21  6:16 PM  Result  Value Ref Range   Color, Urine YELLOW YELLOW   APPearance HAZY (A) CLEAR   Specific Gravity, Urine 1.010 1.005 - 1.030   pH 6.0 5.0 - 8.0   Glucose, UA NEGATIVE NEGATIVE mg/dL   Hgb urine dipstick NEGATIVE NEGATIVE   Bilirubin Urine NEGATIVE NEGATIVE   Ketones, ur 5 (A) NEGATIVE mg/dL   Protein, ur NEGATIVE NEGATIVE mg/dL   Nitrite NEGATIVE NEGATIVE   Leukocytes,Ua TRACE (A) NEGATIVE   RBC / HPF 0-5  0 - 5 RBC/hpf   WBC, UA 11-20 0 - 5 WBC/hpf   Bacteria, UA MANY (A) NONE SEEN   Squamous Epithelial / LPF 6-10 0 - 5   Mucus PRESENT   Protein / creatinine ratio, urine     Status: None   Collection Time: 09/21/21  6:16 PM  Result Value Ref Range   Creatinine, Urine 85.81 mg/dL   Total Protein, Urine 8 mg/dL   Protein Creatinine Ratio 0.09 0.00 - 0.15 mg/mg[Cre]  CBC     Status: Abnormal   Collection Time: 09/21/21  7:27 PM  Result Value Ref Range   WBC 9.8 4.0 - 10.5 K/uL   RBC 3.84 (L) 3.87 - 5.11 MIL/uL   Hemoglobin 12.0 12.0 - 15.0 g/dL   HCT 16.134.9 (L) 09.636.0 - 04.546.0 %   MCV 90.9 80.0 - 100.0 fL   MCH 31.3 26.0 - 34.0 pg   MCHC 34.4 30.0 - 36.0 g/dL   RDW 40.914.1 81.111.5 - 91.415.5 %   Platelets 183 150 - 400 K/uL   nRBC 0.0 0.0 - 0.2 %  Comprehensive metabolic panel     Status: Abnormal   Collection Time: 09/21/21  7:27 PM  Result Value Ref Range   Sodium 134 (L) 135 - 145 mmol/L   Potassium 3.5 3.5 - 5.1 mmol/L   Chloride 105 98 - 111 mmol/L   CO2 22 22 - 32 mmol/L   Glucose, Bld 92 70 - 99 mg/dL   BUN 5 (L) 6 - 20 mg/dL   Creatinine, Ser 7.820.57 0.44 - 1.00 mg/dL   Calcium 8.8 (L) 8.9 - 10.3 mg/dL   Total Protein 6.5 6.5 - 8.1 g/dL   Albumin 2.7 (L) 3.5 - 5.0 g/dL   AST 20 15 - 41 U/L   ALT 11 0 - 44 U/L   Alkaline Phosphatase 101 38 - 126 U/L   Total Bilirubin 0.5 0.3 - 1.2 mg/dL   GFR, Estimated >95>60 >62>60 mL/min   Anion gap 7 5 - 15     Results for orders placed or performed during the hospital encounter of 09/21/21 (from the past 24 hour(s))  Urinalysis, Routine w reflex  microscopic Urine, Clean Catch     Status: Abnormal   Collection Time: 09/21/21  6:16 PM  Result Value Ref Range   Color, Urine YELLOW YELLOW   APPearance HAZY (A) CLEAR   Specific Gravity, Urine 1.010 1.005 - 1.030   pH 6.0 5.0 - 8.0   Glucose, UA NEGATIVE NEGATIVE mg/dL   Hgb urine dipstick NEGATIVE NEGATIVE   Bilirubin Urine NEGATIVE NEGATIVE   Ketones, ur 5 (A) NEGATIVE mg/dL   Protein, ur NEGATIVE NEGATIVE mg/dL   Nitrite NEGATIVE NEGATIVE   Leukocytes,Ua TRACE (A) NEGATIVE   RBC / HPF 0-5 0 - 5 RBC/hpf   WBC, UA 11-20 0 - 5 WBC/hpf   Bacteria, UA MANY (A) NONE SEEN   Squamous Epithelial / LPF 6-10 0 - 5   Mucus PRESENT   Protein / creatinine ratio, urine     Status: None   Collection Time: 09/21/21  6:16 PM  Result Value Ref Range   Creatinine, Urine 85.81 mg/dL   Total Protein, Urine 8 mg/dL   Protein Creatinine Ratio 0.09 0.00 - 0.15 mg/mg[Cre]  CBC     Status: Abnormal   Collection Time: 09/21/21  7:27 PM  Result Value Ref Range   WBC 9.8 4.0 - 10.5 K/uL   RBC 3.84 (L) 3.87 - 5.11 MIL/uL  Hemoglobin 12.0 12.0 - 15.0 g/dL   HCT 34.9 (L) 36.0 - 46.0 %   MCV 90.9 80.0 - 100.0 fL   MCH 31.3 26.0 - 34.0 pg   MCHC 34.4 30.0 - 36.0 g/dL   RDW 14.1 11.5 - 15.5 %   Platelets 183 150 - 400 K/uL   nRBC 0.0 0.0 - 0.2 %  Comprehensive metabolic panel     Status: Abnormal   Collection Time: 09/21/21  7:27 PM  Result Value Ref Range   Sodium 134 (L) 135 - 145 mmol/L   Potassium 3.5 3.5 - 5.1 mmol/L   Chloride 105 98 - 111 mmol/L   CO2 22 22 - 32 mmol/L   Glucose, Bld 92 70 - 99 mg/dL   BUN 5 (L) 6 - 20 mg/dL   Creatinine, Ser 0.57 0.44 - 1.00 mg/dL   Calcium 8.8 (L) 8.9 - 10.3 mg/dL   Total Protein 6.5 6.5 - 8.1 g/dL   Albumin 2.7 (L) 3.5 - 5.0 g/dL   AST 20 15 - 41 U/L   ALT 11 0 - 44 U/L   Alkaline Phosphatase 101 38 - 126 U/L   Total Bilirubin 0.5 0.3 - 1.2 mg/dL   GFR, Estimated >60 >60 mL/min   Anion gap 7 5 - 15     MAU Course   Procedures  MDM UA Tylenol 1g CBC-plts 183 CMP P/C ratio   Elevated Blood Pressures BP in MAU have been mostly controlled with one elevated to 127/91. PreE labs were ordered and pending.  Headache Was given 1 g tylenol in MAU  Nausea/Vomiting Has some nausea right now and able to tolerate food/water would like to avoid medications currently. Has not had vomiting recently.    Assessment and Plan   Elevated Blood Pressures  Headache  Nausea/Vomiting  Care turned over to Jorje Guild, [place] 09/21/2021, 7:51 PM    Patient with some mildly elevated DBPs in MAU but mostly normotensive. Preeclampsia labs normal. Reviewed patient with Dr. Elgie Congo. Does not yet meet criteria for gestational hypertension based on BPs in the healthcare setting (not BRX or home BPs). Will have patient f/u in the office.   Patient has appointment with [facility] on Wednesday - if BP elevated she will have diagnosis of gestational hypertension.      1. Elevated BP without diagnosis of hypertension   2. Headache in pregnancy, antepartum, third trimester   3. [redacted] weeks gestation of pregnancy    -reviewed preeclampsia signs & reasons to return to MAU -message to patient's OB regarding elevated BPs in MAU today   Jorje Guild, NP

## 2021-09-23 ENCOUNTER — Inpatient Hospital Stay (HOSPITAL_COMMUNITY): Payer: 59 | Admitting: Anesthesiology

## 2021-09-23 ENCOUNTER — Inpatient Hospital Stay (HOSPITAL_COMMUNITY)
Admission: AD | Admit: 2021-09-23 | Discharge: 2021-09-25 | DRG: 807 | Disposition: A | Discharge status: Home / Self Care | Payer: 59 | Attending: Family Medicine | Admitting: Family Medicine

## 2021-09-23 ENCOUNTER — Ambulatory Visit (INDEPENDENT_AMBULATORY_CARE_PROVIDER_SITE_OTHER): Payer: 59 | Admitting: Women's Health

## 2021-09-23 ENCOUNTER — Other Ambulatory Visit: Payer: Self-pay

## 2021-09-23 ENCOUNTER — Encounter (HOSPITAL_COMMUNITY): Payer: Self-pay | Admitting: Obstetrics & Gynecology

## 2021-09-23 VITALS — BP 139/93 | HR 99 | Wt 193.0 lb

## 2021-09-23 DIAGNOSIS — Z20822 Contact with and (suspected) exposure to covid-19: Secondary | ICD-10-CM | POA: Diagnosis present

## 2021-09-23 DIAGNOSIS — O133 Gestational [pregnancy-induced] hypertension without significant proteinuria, third trimester: Secondary | ICD-10-CM

## 2021-09-23 DIAGNOSIS — O134 Gestational [pregnancy-induced] hypertension without significant proteinuria, complicating childbirth: Secondary | ICD-10-CM | POA: Diagnosis not present

## 2021-09-23 DIAGNOSIS — Z3A37 37 weeks gestation of pregnancy: Secondary | ICD-10-CM

## 2021-09-23 DIAGNOSIS — Z7982 Long term (current) use of aspirin: Secondary | ICD-10-CM | POA: Diagnosis not present

## 2021-09-23 DIAGNOSIS — Z1389 Encounter for screening for other disorder: Secondary | ICD-10-CM

## 2021-09-23 DIAGNOSIS — O139 Gestational [pregnancy-induced] hypertension without significant proteinuria, unspecified trimester: Principal | ICD-10-CM | POA: Diagnosis present

## 2021-09-23 DIAGNOSIS — O0993 Supervision of high risk pregnancy, unspecified, third trimester: Secondary | ICD-10-CM | POA: Diagnosis not present

## 2021-09-23 DIAGNOSIS — O164 Unspecified maternal hypertension, complicating childbirth: Secondary | ICD-10-CM | POA: Diagnosis not present

## 2021-09-23 LAB — COMPREHENSIVE METABOLIC PANEL
ALT: 13 U/L (ref 0–44)
AST: 17 U/L (ref 15–41)
Albumin: 2.7 g/dL — ABNORMAL LOW (ref 3.5–5.0)
Alkaline Phosphatase: 106 U/L (ref 38–126)
Anion gap: 10 (ref 5–15)
BUN: <5 mg/dL — ABNORMAL LOW (ref 6–20)
CO2: 21 mmol/L — ABNORMAL LOW (ref 22–32)
Calcium: 8.8 mg/dL — ABNORMAL LOW (ref 8.9–10.3)
Chloride: 104 mmol/L (ref 98–111)
Creatinine, Ser: 0.56 mg/dL (ref 0.44–1.00)
GFR, Estimated: >60 mL/min (ref >60)
Glucose, Bld: 80 mg/dL (ref 70–99)
Potassium: 3.5 mmol/L (ref 3.5–5.1)
Sodium: 135 mmol/L (ref 135–145)
Total Bilirubin: 0.5 mg/dL (ref 0.3–1.2)
Total Protein: 6.2 g/dL — ABNORMAL LOW (ref 6.5–8.1)

## 2021-09-23 LAB — CBC
HCT: 34.2 % — ABNORMAL LOW (ref 36.0–46.0)
Hemoglobin: 11.4 g/dL — ABNORMAL LOW (ref 12.0–15.0)
MCH: 31.4 pg (ref 26.0–34.0)
MCHC: 33.3 g/dL (ref 30.0–36.0)
MCV: 94.2 fL (ref 80.0–100.0)
Platelets: 189 10*3/uL (ref 150–400)
RBC: 3.63 MIL/uL — ABNORMAL LOW (ref 3.87–5.11)
RDW: 14.1 % (ref 11.5–15.5)
WBC: 8.4 10*3/uL (ref 4.0–10.5)
nRBC: 0 % (ref 0.0–0.2)

## 2021-09-23 LAB — POCT URINALYSIS DIPSTICK OB
Blood, UA: NEGATIVE
Glucose, UA: NEGATIVE
Ketones, UA: NEGATIVE
Leukocytes, UA: NEGATIVE
Nitrite, UA: NEGATIVE
POC,PROTEIN,UA: NEGATIVE

## 2021-09-23 LAB — PROTEIN / CREATININE RATIO, URINE
Creatinine, Urine: 31.14 mg/dL
Total Protein, Urine: <6 mg/dL

## 2021-09-23 LAB — TYPE AND SCREEN
ABO/RH(D): O POS
Antibody Screen: NEGATIVE

## 2021-09-23 LAB — RESP PANEL BY RT-PCR (FLU A&B, COVID) ARPGX2
Influenza A by PCR: NEGATIVE
Influenza B by PCR: NEGATIVE
SARS Coronavirus 2 by RT PCR: NEGATIVE

## 2021-09-23 MED ORDER — VITAMIN B-6 100 MG PO TABS
100.0000 mg | ORAL_TABLET | Freq: Two times a day (BID) | ORAL | Status: DC
  Filled 2021-09-23 (×3): qty 1

## 2021-09-23 MED ORDER — TERBUTALINE SULFATE 1 MG/ML IJ SOLN: 0.2500 mg | Freq: Once | INTRAMUSCULAR | Status: DC | PRN

## 2021-09-23 MED ORDER — LIDOCAINE HCL (PF) 1 % IJ SOLN: 30.0000 mL | INTRAMUSCULAR | Status: DC | PRN

## 2021-09-23 MED ORDER — LACTATED RINGERS IV SOLN
500.0000 mL | INTRAVENOUS | Status: DC | PRN
  Administered 2021-09-24: 500 mL via INTRAVENOUS

## 2021-09-23 MED ORDER — EPHEDRINE 5 MG/ML INJ: 10.0000 mg | INTRAVENOUS | Status: DC | PRN

## 2021-09-23 MED ORDER — PHENYLEPHRINE 40 MCG/ML (10ML) SYRINGE FOR IV PUSH (FOR BLOOD PRESSURE SUPPORT)
80.0000 ug | PREFILLED_SYRINGE | INTRAVENOUS | Status: DC | PRN
  Administered 2021-09-24: 80 ug via INTRAVENOUS

## 2021-09-23 MED ORDER — OXYCODONE-ACETAMINOPHEN 5-325 MG PO TABS: 1.0000 | ORAL_TABLET | ORAL | Status: DC | PRN

## 2021-09-23 MED ORDER — ACETAMINOPHEN 325 MG PO TABS
650.0000 mg | ORAL_TABLET | ORAL | Status: DC | PRN
Start: 2021-09-23 — End: 2021-09-24
  Administered 2021-09-24 (×2): 650 mg via ORAL
  Filled 2021-09-23 (×2): qty 2

## 2021-09-23 MED ORDER — OXYTOCIN BOLUS FROM INFUSION
333.0000 mL | Freq: Once | INTRAVENOUS | Status: AC
  Administered 2021-09-24: 333 mL via INTRAVENOUS

## 2021-09-23 MED ORDER — FENTANYL CITRATE (PF) 100 MCG/2ML IJ SOLN: 100.0000 ug | INTRAMUSCULAR | Status: DC | PRN

## 2021-09-23 MED ORDER — LACTATED RINGERS IV SOLN: 500.0000 mL | Freq: Once | INTRAVENOUS | Status: DC

## 2021-09-23 MED ORDER — OXYTOCIN-SODIUM CHLORIDE 30-0.9 UT/500ML-% IV SOLN
1.0000 m[IU]/min | INTRAVENOUS | Status: DC
  Administered 2021-09-23: 1 m[IU]/min via INTRAVENOUS

## 2021-09-23 MED ORDER — LACTATED RINGERS IV SOLN: INTRAVENOUS | Status: DC

## 2021-09-23 MED ORDER — ONDANSETRON HCL 4 MG/2ML IJ SOLN
4.0000 mg | Freq: Four times a day (QID) | INTRAMUSCULAR | Status: DC | PRN
  Administered 2021-09-24: 4 mg via INTRAVENOUS
  Filled 2021-09-23: qty 2

## 2021-09-23 MED ORDER — SOD CITRATE-CITRIC ACID 500-334 MG/5ML PO SOLN: 30.0000 mL | ORAL | Status: DC | PRN

## 2021-09-23 MED ORDER — OXYCODONE-ACETAMINOPHEN 5-325 MG PO TABS: 2.0000 | ORAL_TABLET | ORAL | Status: DC | PRN

## 2021-09-23 MED ORDER — MISOPROSTOL 25 MCG QUARTER TABLET
25.0000 ug | ORAL_TABLET | ORAL | Status: DC | PRN
Start: 2021-09-23 — End: 2021-09-24
  Administered 2021-09-23: 25 ug via VAGINAL
  Filled 2021-09-23: qty 1

## 2021-09-23 MED ORDER — FENTANYL-BUPIVACAINE-NACL 0.5-0.125-0.9 MG/250ML-% EP SOLN
12.0000 mL/h | EPIDURAL | Status: DC | PRN
  Administered 2021-09-23: 12 mL/h via EPIDURAL
  Filled 2021-09-23: qty 250

## 2021-09-23 MED ORDER — PHENYLEPHRINE 40 MCG/ML (10ML) SYRINGE FOR IV PUSH (FOR BLOOD PRESSURE SUPPORT)
80.0000 ug | PREFILLED_SYRINGE | INTRAVENOUS | Status: DC | PRN
  Filled 2021-09-23: qty 10

## 2021-09-23 MED ORDER — DOXYLAMINE SUCCINATE (SLEEP) 25 MG PO TABS
12.5000 mg | ORAL_TABLET | Freq: Every evening | ORAL | Status: DC | PRN
  Filled 2021-09-23: qty 1

## 2021-09-23 MED ORDER — BUTALBITAL-APAP-CAFFEINE 50-325-40 MG PO TABS: 1.0000 | ORAL_TABLET | ORAL | Status: DC | PRN

## 2021-09-23 MED ORDER — LIDOCAINE HCL (PF) 1 % IJ SOLN
INTRAMUSCULAR | Status: DC | PRN
Start: 2021-09-23 — End: 2021-09-24
  Administered 2021-09-23: 11 mL via EPIDURAL

## 2021-09-23 MED ORDER — DIPHENHYDRAMINE HCL 50 MG/ML IJ SOLN: 12.5000 mg | INTRAMUSCULAR | Status: DC | PRN

## 2021-09-23 MED ORDER — OXYTOCIN-SODIUM CHLORIDE 30-0.9 UT/500ML-% IV SOLN
2.5000 [IU]/h | INTRAVENOUS | Status: DC
  Administered 2021-09-24: 2.5 [IU]/h via INTRAVENOUS
  Filled 2021-09-23: qty 500

## 2021-09-23 NOTE — Progress Notes (Addendum)
Patient ID: [name], female   DOB: [birth year], 30 y.o.   MRN: [id number] ? ?Sitting at edge of bed for epidural placement; s/p cyto x 1 dose and cervical foley @ 2300 ? ?BPs 120/81, 123/88 ?FHR 125, +accels, no decels ?Ctx q 2-3 mins with Pit at 46mu/min ?Cx 3-4/50/vtx -3 per RN when foley came out ? ?IUP@37 .1wk ?gHTN- neg labs ?IOL process ? ?After epidural is placed, start uptitrating Pitocin as needed to keep ctx reg ?Anticipate vag del ? ?Arabella Merles CNM ?09/23/2021 ? ?

## 2021-09-23 NOTE — Progress Notes (Signed)
? ?  HIGH-RISK PREGNANCY VISIT ?Patient name: [name] MRN [id number]  Date of birth: 17-Jan-1991 ?Chief Complaint:   ?Routine Prenatal Visit ? ?History of Present Illness:   ?[name] is a 31 y.o. G79P1011 female at [redacted]w[redacted]d with an Estimated Date of Delivery: 10/13/21 being seen today for ongoing management of a high-risk pregnancy complicated by gestational hypertension currently on no meds, dx officially today.   ? ?Today she reports  persistent headache, apap doesn't help. Was seeing stars, none right now. Denies ruq/epigastric pain. . Contractions: Irritability. Vag. Bleeding: None.  Movement: Present. denies leaking of fluid.  ? ?Depression screen [facility] 2/9 07/14/2021 04/06/2021  ?Decreased Interest 0 0  ?Down, Depressed, Hopeless 0 0  ?PHQ - 2 Score 0 0  ?Altered sleeping 0 0  ?Tired, decreased energy 0 0  ?Change in appetite 0 0  ?Feeling bad or failure about yourself  0 0  ?Trouble concentrating 0 0  ?Moving slowly or fidgety/restless 0 0  ?Suicidal thoughts 0 0  ?PHQ-9 Score 0 0  ? ?  ?GAD 7 : Generalized Anxiety Score 07/14/2021 04/06/2021  ?Nervous, Anxious, on Edge 0 0  ?Control/stop worrying 0 0  ?Worry too much - different things 0 0  ?Trouble relaxing 0 0  ?Restless 0 0  ?Easily annoyed or irritable 0 0  ?Afraid - awful might happen 0 0  ?Total GAD 7 Score 0 0  ? ? ? ?Review of Systems:   ?Pertinent items are noted in HPI ?Denies abnormal vaginal discharge w/ itching/odor/irritation, headaches, visual changes, shortness of breath, chest pain, abdominal pain, severe nausea/vomiting, or problems with urination or bowel movements unless otherwise stated above. ?Pertinent History Reviewed:  ?Reviewed past medical,surgical, social, obstetrical and family history.  ?Reviewed problem list, medications and allergies. ?Physical Assessment:  ? ?Vitals:  ? 09/23/21 1549 09/23/21 1552  ?BP: 128/90 (!) 139/93  ?Pulse: 100 99  ?Weight: 193 lb (87.5 kg)   ?Body mass index is 31.15 kg/m?. ?     ?     Physical  Examination:  ? General appearance: alert, well appearing, and in no distress ? Mental status: alert, oriented to person, place, and time ? Skin: warm & dry  ? Extremities: Edema: Trace  ?  Cardiovascular: normal heart rate noted ? Respiratory: normal respiratory effort, no distress ? Abdomen: gravid, soft, non-tender ? Pelvic: Cervical exam deferred        ? ?Fetal Status: Fetal Heart Rate (bpm): 135 Fundal Height: 35 cm Movement: Present   ? ?Fetal Surveillance Testing today: doppler  ? ?Chaperone: N/A   ? ?Urine dipstick per nurse: neg ? ?No results found for this or any previous visit (from the past 24 hour(s)).  ?Assessment & Plan:  ?High-risk pregnancy: G3P1011 at [redacted]w[redacted]d with an Estimated Date of Delivery: 10/13/21  ? ?1) GHTN, dx today, to [facility] for IOL. Notified Dr. Si Raider who will notify charge/labor team ? ?Meds: No orders of the defined types were placed in this encounter. ? ? ?Labs/procedures today: none ? ?Treatment Plan:  to [facility] for IOL ? ?Follow-up: Return for schedule pp bp check after delivery. ? ? ?Future Appointments  ?Date Time Provider [place]  ?09/30/2021  2:50 PM Myrtis Ser, CNM [facility]  ?10/09/2021  9:10 AM Roma Schanz, CNM [facility]  ? ? ?No orders of the defined types were placed in this encounter. ? ?Roma Schanz CNM, [place] ?09/23/2021 ?4:18 PM  ?

## 2021-09-23 NOTE — H&P (Addendum)
OBSTETRIC ADMISSION HISTORY AND PHYSICAL ? ?ELZA VARRICCHIO is a 31 y.o. female G3P1011 at [redacted]w[redacted]d by 9wk [place] presenting for IOL for gestational hypertension. She reports +FMs, No LOF, no VB, no blurry vision, peripheral edema, or RUQ pain. She does endorse an intermittent headache since 3/11 currently she feels a 3/10 frontal headache but does not feel like this is bothersome enough to take tylenol at this time. She reports mild nausea and decreased appetite. She plans on breast/bottle feeding. She request POPs for birth control. ?She received her prenatal care at [facility]  ? ?Dating: By Gaetana Michaelis [place] --->  Estimated Date of Delivery: 10/13/21 ? ?Sono:   ? ?@[redacted]w[redacted]d , CWD, normal anatomy, breech presentation, anterior placental lie, 273g, 58% EFW ? ? ?Prenatal History/Complications:  ?Gestational Hypertension ? ?Past Medical History: ?Past Medical History:  ?Diagnosis Date  ? Gestational hypertension, antepartum 06/06/2012  ? PONV (postoperative nausea and vomiting)   ? Pyelonephritis   ? Urinary tract infection   ? ? ?Past Surgical History: ?Past Surgical History:  ?Procedure Laterality Date  ? Bladder stem stretcher    ? DILATION AND EVACUATION N/A 03/30/2016  ? Procedure: DILATATION AND EVACUATION--suction D&C;  Surgeon: 04/01/2016, MD;  Location: [facility];  Service: Gynecology;  Laterality: N/A;  ? GALLBLADDER SURGERY    ? TONSILLECTOMY    ? ? ?Obstetrical History: ?OB History   ? ? Gravida  ?3  ? Para  ?1  ? Term  ?1  ? Preterm  ?0  ? AB  ?1  ? Living  ?1  ?  ? ? SAB  ?1  ? IAB  ?0  ? Ectopic  ?0  ? Multiple  ?0  ? Live Births  ?1  ?   ?  ?  ? ? ?Social History ?Social History  ? ?Socioeconomic History  ? Marital status: Married  ?  Spouse name: Not on file  ? Number of children: Not on file  ? Years of education: Not on file  ? Highest education level: Not on file  ?Occupational History  ? Not on file  ?Tobacco Use  ? Smoking status: Never  ? Smokeless tobacco: Never  ?Vaping Use  ? Vaping Use: Never used  ?Substance  and Sexual Activity  ? Alcohol use: No  ? Drug use: No  ? Sexual activity: Yes  ?  Birth control/protection: None  ?Other Topics Concern  ? Not on file  ?Social History Narrative  ? Not on file  ? ?Social Determinants of Health  ? ?Financial Resource Strain: Low Risk   ? Difficulty of Paying Living Expenses: Not hard at all  ?Food Insecurity: No Food Insecurity  ? Worried About Lazaro Arms in the Last Year: Never true  ? Ran Out of Food in the Last Year: Never true  ?Transportation Needs: No Transportation Needs  ? Lack of Transportation (Medical): No  ? Lack of Transportation (Non-Medical): No  ?Physical Activity: Sufficiently Active  ? Days of Exercise per Week: 4 days  ? Minutes of Exercise per Session: 40 min  ?Stress: No Stress Concern Present  ? Feeling of Stress : Not at all  ?Social Connections: Moderately Integrated  ? Frequency of Communication with Friends and Family: More than three times a week  ? Frequency of Social Gatherings with Friends and Family: More than three times a week  ? Attends Religious Services: More than 4 times per year  ? Active Member of Clubs or Organizations: No  ?  Attends Banker Meetings: Never  ? Marital Status: Married  ? ? ?Family History: ?Family History  ?Problem Relation Age of Onset  ? Breast cancer Mother   ? Leukemia Paternal Grandfather   ? Other Neg Hx   ? ? ?Allergies: ?No Known Allergies ? ?Medications Prior to Admission  ?Medication Sig Dispense Refill Last Dose  ? aspirin 81 MG EC tablet Take 2 tablets (162 mg total) by mouth daily. Swallow whole. 180 tablet 2   ? budesonide-formoterol (SYMBICORT) 160-4.5 MCG/ACT inhaler Inhale 2 puffs into the lungs 2 (two) times daily. Rinse mouth with water after each use (Patient taking differently: Inhale 2 puffs into the lungs as needed. Rinse mouth with water after each use) 1 each 0   ? Calcium Carbonate Antacid (TUMS PO) Take by mouth.     ? doxylamine, Sleep, (UNISOM) 25 MG tablet Take 25 mg by mouth  at bedtime as needed.     ? ondansetron (ZOFRAN ODT) 4 MG disintegrating tablet 4mg  ODT q4 hours prn nausea/vomit (Patient not taking: Reported on 09/23/2021) 30 tablet 0   ? Prenatal MV & Min w/FA-DHA (PRENATAL ADULT GUMMY/DHA/FA PO) Take by mouth. Takes 2 daily     ? promethazine (PHENERGAN) 25 MG tablet Take 1 tablet (25 mg total) by mouth every 6 (six) hours as needed for nausea or vomiting. (Patient not taking: Reported on 09/23/2021) 30 tablet 0   ? pyridOXINE (VITAMIN B-6) 100 MG tablet Take 100 mg by mouth 2 (two) times daily.     ? ? ? ?Review of Systems  ? ?All systems reviewed and negative except as stated in HPI ? ?Blood pressure 127/82, pulse 89, resp. rate 18, height 5\' 6"  (1.676 m), weight 88.1 kg, last menstrual period 01/13/2021, SpO2 97 %. ?General appearance: alert, cooperative, and appears stated age ?Lungs: comfortable work of breathing on room air ?Heart: normal rate, good peripheral perfusion ?Abdomen: soft, non-tender, gravid ?Extremities: no calf tenderness or lower extremity edema ? ?Presentation: cephalic ?Fetal monitoring: Baseline: 135 bpm, Variability: Good {> 6 bpm), Accelerations: Reactive, and Decelerations: Absent ?Uterine activity Frequency: Occasional ?Dilation: 1 ?Effacement (%): Thick ?Station: -2 ?Exam by:: 03/16/2021 CNM ? ? ?Prenatal labs: ?ABO, Rh: O/Positive/-- (09/26 1534) ?Antibody: Negative (01/03 0807) ?Rubella: 3.35 (09/26 1534) ?RPR: Non Reactive (01/03 0807)  ?HBsAg: Negative (09/26 1534)  ?HIV: Non Reactive (01/03 0807)  ?GBS: Negative/-- (03/08 1630)  ?2 hr Glucola normal ?Genetic screening  NT/IT negative, Panorama: low-risk female ?Anatomy 05[birth year]1979: Isolated EIF with no recommended f/u, otherwise normal ? ?Prenatal Transfer Tool  ?Maternal Diabetes: No ?Genetic Screening: Normal ?Maternal Ultrasounds/Referrals: Normal ?Fetal Ultrasounds or other Referrals:  None ?Maternal Substance Abuse:  No ?Significant Maternal Medications:  None ?Significant Maternal Lab Results: Group B  Strep negative ? ?Results for orders placed or performed in visit on 09/23/21 (from the past 24 hour(s))  ?POC Urinalysis Dipstick OB  ? Collection Time: 09/23/21  4:19 PM  ?Result Value Ref Range  ? Color, UA    ? Clarity, UA    ? Glucose, UA Negative Negative  ? Bilirubin, UA    ? Ketones, UA neg   ? Spec Grav, UA    ? Blood, UA neg   ? pH, UA    ? POC,PROTEIN,UA Negative Negative, Trace, Small (1+), Moderate (2+), Large (3+), 4+  ? Urobilinogen, UA    ? Nitrite, UA neg   ? Leukocytes, UA Negative Negative  ? Appearance    ? Odor    ? ? ?  Patient Active Problem List  ? Diagnosis Date Noted  ? History of gestational hypertension 04/06/2021  ? Encounter for supervision of normal pregnancy, antepartum 04/06/2021  ? Family history of breast cancer in mother 04/06/2021  ? ? ?Assessment/Plan:  ?Darron DoomJennifer B Sitton is a 31 y.o. G3P1011 at 3093w1d here for IOL for gestational hypertension. ? ?#Labor: IOL for gHTN.  ?#Pain: Medications PRN, considering epidural ?#FWB: Category 1 ?#ID:  GBS negative ?#MOF: Breast/bottle ?#MOC: POPs ? ?#Gestational Hypertension: New diagnosis today. Normal labs on 3/13. Mild range blood pressures since arrival. Checking CBC, CMP, urine Protein/Cr ratio. Continue to monitor blood pressures and symptoms.  ? ?Theresia MajorsAdam J Glenn, MD ?FM PGY-1 ? ?CNM attestation: ? ?I have seen and examined this patient; I agree with above documentation in the resident's note.  ? ?Darron DoomJennifer B Mauch is a 31 y.o. [phone] here for IOL due to gHTN officially diagnosed in the office today with her second elevated BP; she was also reporting a mild H/A that she declines meds for ? ?PE: ?BP 127/79   Pulse 85   Temp 98 ?F (36.7 ?C)   Resp 18   Ht 5\' 6"  (1.676 m)   Wt 88.1 kg   LMP 01/13/2021   SpO2 97%   BMI 31.34 kg/m?  ?Gen: calm comfortable, NAD ?Resp: normal effort, no distress ?Abd: gravid ? ?ROS, labs, PMH reviewed ? ?Plan: ?-Admit to Labor and Delivery ?-Cervical foley inserted without difficulty; cytotec given as  well ?-Pre-e labs collected and pending ?-Plan for Pitocin when foley comes out ?-Continue to monitor BPs and pre-e symptoms ?-Anticipate vag del ? ?Arabella MerlesKimberly D Reniya Mcclees CNM ?09/23/2021, 7:33 PM  ? ? ? ?

## 2021-09-23 NOTE — Anesthesia Preprocedure Evaluation (Signed)
Anesthesia Evaluation  ?Patient identified by MRN, date of birth, ID band ?Patient awake ? ? ? ?Reviewed: ?Allergy & Precautions, NPO status , Patient's Chart, lab work & pertinent test results ? ?History of Anesthesia Complications ?(+) PONV and history of anesthetic complications ? ?Airway ?Mallampati: II ? ?TM Distance: >3 FB ?Neck ROM: Full ? ? ? Dental ?no notable dental hx. ?(+) Dental Advisory Given ?  ?Pulmonary ?neg pulmonary ROS,  ?  ?Pulmonary exam normal ? ? ? ? ? ? ? Cardiovascular ?hypertension, negative cardio ROS ?Normal cardiovascular exam ? ? ?  ?Neuro/Psych ?negative neurological ROS ? negative psych ROS  ? GI/Hepatic ?negative GI ROS, Neg liver ROS,   ?Endo/Other  ?negative endocrine ROS ? Renal/GU ?negative Renal ROS  ?negative genitourinary ?  ?Musculoskeletal ?negative musculoskeletal ROS ?(+)  ? Abdominal ?  ?Peds ?negative pediatric ROS ?(+)  Hematology ?negative hematology ROS ?(+)   ?Anesthesia Other Findings ? ? Reproductive/Obstetrics ?(+) Pregnancy ? ?  ? ? ? ? ? ? ? ? ? ? ? ? ? ?  ?  ? ? ? ? ? ? ? ? ?Anesthesia Physical ? ?Anesthesia Plan ? ?ASA: II ? ?Anesthesia Plan: Epidural  ? ?Post-op Pain Management:   ? ?Induction: Intravenous ? ?PONV Risk Score and Plan:  ? ?Airway Management Planned:  ? ?Additional Equipment:  ? ?Intra-op Plan:  ? ?Post-operative Plan:  ? ?Informed Consent: I have reviewed the patients History and Physical, chart, labs and discussed the procedure including the risks, benefits and alternatives for the proposed anesthesia with the patient or authorized representative who has indicated his/her understanding and acceptance.  ? ? ? ?Dental advisory given ? ?Plan Discussed with: CRNA and Anesthesiologist ? ?Anesthesia Plan Comments:   ? ? ? ? ? ? ?Anesthesia Quick Evaluation ? ?

## 2021-09-23 NOTE — Anesthesia Procedure Notes (Signed)
Epidural ?Patient location during procedure: OB ?Start time: 09/23/2021 11:41 PM ?End time: 09/23/2021 11:58 PM ? ?Staffing ?Anesthesiologist: Lynda Rainwater, MD ?Performed: anesthesiologist  ? ?Preanesthetic Checklist ?Completed: patient identified, IV checked, site marked, risks and benefits discussed, surgical consent, monitors and equipment checked, pre-op evaluation and timeout performed ? ?Epidural ?Patient position: sitting ?Prep: ChloraPrep ?Patient monitoring: heart rate, cardiac monitor, continuous pulse ox and blood pressure ?Approach: midline ?Location: L2-L3 ?Injection technique: LOR saline ? ?Needle:  ?Needle type: Tuohy  ?Needle gauge: 17 G ?Needle length: 9 cm ?Needle insertion depth: 6 cm ?Catheter type: closed end flexible ?Catheter size: 20 Guage ?Catheter at skin depth: 10 cm ?Test dose: negative ? ?Assessment ?Events: blood not aspirated, injection not painful, no injection resistance, no paresthesia and negative IV test ? ?Additional Notes ?Reason for block:procedure for pain ? ? ? ?

## 2021-09-24 ENCOUNTER — Encounter (HOSPITAL_COMMUNITY): Payer: Self-pay | Admitting: Obstetrics & Gynecology

## 2021-09-24 DIAGNOSIS — O139 Gestational [pregnancy-induced] hypertension without significant proteinuria, unspecified trimester: Secondary | ICD-10-CM

## 2021-09-24 DIAGNOSIS — O134 Gestational [pregnancy-induced] hypertension without significant proteinuria, complicating childbirth: Secondary | ICD-10-CM

## 2021-09-24 DIAGNOSIS — Z3A37 37 weeks gestation of pregnancy: Secondary | ICD-10-CM

## 2021-09-24 LAB — RPR: RPR Ser Ql: NONREACTIVE

## 2021-09-24 MED ORDER — WITCH HAZEL-GLYCERIN EX PADS: 1.0000 "application " | MEDICATED_PAD | CUTANEOUS | Status: DC | PRN

## 2021-09-24 MED ORDER — IBUPROFEN 600 MG PO TABS
600.0000 mg | ORAL_TABLET | Freq: Four times a day (QID) | ORAL | Status: DC
  Administered 2021-09-24 – 2021-09-25 (×4): 600 mg via ORAL
  Filled 2021-09-24 (×4): qty 1

## 2021-09-24 MED ORDER — ACETAMINOPHEN 325 MG PO TABS: 650.0000 mg | ORAL_TABLET | ORAL | Status: DC | PRN

## 2021-09-24 MED ORDER — PRENATAL MULTIVITAMIN CH
1.0000 | ORAL_TABLET | Freq: Every day | ORAL | Status: DC
  Administered 2021-09-25: 1 via ORAL
  Filled 2021-09-24: qty 1

## 2021-09-24 MED ORDER — DIBUCAINE (PERIANAL) 1 % EX OINT: 1.0000 "application " | TOPICAL_OINTMENT | CUTANEOUS | Status: DC | PRN

## 2021-09-24 MED ORDER — FUROSEMIDE 20 MG PO TABS
20.0000 mg | ORAL_TABLET | Freq: Every day | ORAL | Status: DC
  Administered 2021-09-25: 20 mg via ORAL
  Filled 2021-09-24: qty 1

## 2021-09-24 MED ORDER — SENNOSIDES-DOCUSATE SODIUM 8.6-50 MG PO TABS
2.0000 | ORAL_TABLET | Freq: Every day | ORAL | Status: DC
  Administered 2021-09-25: 2 via ORAL
  Filled 2021-09-24: qty 2

## 2021-09-24 MED ORDER — ONDANSETRON HCL 4 MG/2ML IJ SOLN
4.0000 mg | INTRAMUSCULAR | Status: DC | PRN
Start: 2021-09-24 — End: 2021-09-25

## 2021-09-24 MED ORDER — SCOPOLAMINE 1 MG/3DAYS TD PT72
1.0000 | MEDICATED_PATCH | TRANSDERMAL | Status: DC
  Administered 2021-09-24: 1.5 mg via TRANSDERMAL
  Filled 2021-09-24: qty 1

## 2021-09-24 MED ORDER — SIMETHICONE 80 MG PO CHEW: 80.0000 mg | CHEWABLE_TABLET | ORAL | Status: DC | PRN

## 2021-09-24 MED ORDER — BENZOCAINE-MENTHOL 20-0.5 % EX AERO: 1.0000 "application " | INHALATION_SPRAY | CUTANEOUS | Status: DC | PRN

## 2021-09-24 MED ORDER — COCONUT OIL OIL: 1.0000 "application " | TOPICAL_OIL | Status: DC | PRN

## 2021-09-24 MED ORDER — ONDANSETRON HCL 4 MG PO TABS: 4.0000 mg | ORAL_TABLET | ORAL | Status: DC | PRN

## 2021-09-24 MED ORDER — DIPHENHYDRAMINE HCL 25 MG PO CAPS: 25.0000 mg | ORAL_CAPSULE | Freq: Four times a day (QID) | ORAL | Status: DC | PRN

## 2021-09-24 NOTE — Lactation Note (Addendum)
This note was copied from a baby's chart. ?Lactation Consultation Note ? ?Patient Name: [name] ?Today's Date: 09/24/2021 ?Reason for consult: Follow-up assessment;1st time breastfeeding ?Age:30 hours ? ?P2, RN requested assistance with latching. ?Prepumped with manual pump and hand expressed to elongate nipple.  Provided shells to wear.  ?Baby latched in in football hold and was able to sustain latch for 10 min.  Provided pillows for support. ?Provided mother with employee [employer] pump.  ?Feed on demand with cues.  Goal 8-12+ times per day after first 24 hrs.  Place baby STS if not cueing.  ?Mom made aware of O/P services, breastfeeding support groups, and our phone # for post-discharge questions.  ? ? ?Feeding ?Mother's Current Feeding Choice: Breast Milk and Formula ? ?LATCH Score ?Latch: Repeated attempts needed to sustain latch, nipple held in mouth throughout feeding, stimulation needed to elicit sucking reflex. ? ?Audible Swallowing: A few with stimulation ? ?Type of Nipple: Everted at rest and after stimulation (short shaft) ? ?Comfort (Breast/Nipple): Soft / non-tender ? ?Hold (Positioning): Assistance needed to correctly position infant at breast and maintain latch. ? ?LATCH Score: 7 ? ? ?Lactation Tools Discussed/Used ? Manual pump ? ?Interventions ?Interventions: Assisted with latch;Skin to skin;Hand pump;Shells;Education;LC Services brochure ? ?Discharge ?Pump: Employee Pump ? ?Consult Status ?Consult Status: Follow-up ?Date: 09/25/21 ?Follow-up type: In-patient ? ? ? ?Dahlia Byes Boschen ?09/24/2021, 2:25 PM ? ? ? ?

## 2021-09-24 NOTE — Lactation Note (Signed)
Lactation Consultation Note ? ?Patient Name: [name] ?Today's Date: 09/24/2021 ?Reason for consult: L&D Initial assessment ?Age:31 y.o. ? ?P2, First time breastfeeding.  Baby cueing. ?Baby latched off and on breast, working on depth. ?Lactation to follow up on MBU.  ? ? ?LATCH Score ?Latch: Repeated attempts needed to sustain latch, nipple held in mouth throughout feeding, stimulation needed to elicit sucking reflex. ? ?Audible Swallowing: A few with stimulation ? ?Type of Nipple: Everted at rest and after stimulation (short shaft) ? ?Comfort (Breast/Nipple): Soft / non-tender ? ?Hold (Positioning): Assistance needed to correctly position infant at breast and maintain latch. ? ?LATCH Score: 7 ? ?Interventions ?Interventions: Assisted with latch;Skin to skin;Education ? ?Consult Status ?Consult Status: Follow-up from L&D ? ? ? ?Vivianne Master Boschen ?09/24/2021, 11:25 AM ? ? ? ?

## 2021-09-24 NOTE — Progress Notes (Signed)
Patient ID: [name], female   DOB: Jan 17, 1991, 31 y.o.   MRN: [id number] ? ?Comfortable w epidural; has had intermittent mild H/A that was gone most of the night and that is better now after Tylenol; no visual changes/RUQ pain ? ?BP 114/77, 93/55, 114/74 ?FHR 140s, +accels, no decels ?Ctx q 2 mins with Pit at 44mu/min ?Cx 4/70/vtx -2; AROM for clear/brown-tinged pieces of old blood ? ?IUP@37 .2wks ?gHTN- stable BPs ?Latent labor ? ?Hopeful that AROM along with Pitocin will take her into active labor ?Anticipate vag del ? ?Myrtis Ser CNM ?09/24/2021 ?7:52 AM ? ? ?

## 2021-09-24 NOTE — Anesthesia Postprocedure Evaluation (Signed)
Anesthesia Post Note ? ?Patient: Judith PICKING ? ?Procedure(s) Performed: AN AD HOC LABOR EPIDURAL ? ?  ? ?Patient location during evaluation: Mother Baby ?Anesthesia Type: Epidural ?Level of consciousness: awake ?Pain management: satisfactory to patient ?Vital Signs Assessment: post-procedure vital signs reviewed and stable ?Respiratory status: spontaneous breathing ?Cardiovascular status: stable ?Anesthetic complications: no ? ? ?No notable events documented. ? ?Last Vitals:  ?Vitals:  ? 09/24/21 1205 09/24/21 1310  ?BP: 110/82 113/81  ?Pulse: 82 77  ?Resp: 18 18  ?Temp: 36.8 ?C 37.4 ?C  ?SpO2: 100% 100%  ?  ?Last Pain:  ?Vitals:  ? 09/24/21 1310  ?TempSrc: Oral  ?PainSc: 0-No pain  ? ?Pain Goal: Patients Stated Pain Goal: 4 (09/24/21 1045) ? ?  ?  ?  ?  ?  ?  ?  ? ?[name] ? ? ? ? ?

## 2021-09-24 NOTE — Discharge Summary (Signed)
? ?  Postpartum Discharge Summary ? ? ?   ?Patient Name: [name] ?DOB: Oct 16, 1990 ?MRN: [id number] ? ?Date of admission: 09/23/2021 ?Delivery date:09/24/2021  ?Delivering provider: Lauretta Chester NILES  ?Date of discharge: 09/25/2021 ? ?Admitting diagnosis: Gestational hypertension [O13.9] ?Intrauterine pregnancy: [redacted]w[redacted]d    ?Secondary diagnosis:  Principal Problem: ?  Gestational hypertension ? ?Additional problems: none    ?Discharge diagnosis: Term Pregnancy Delivered                                              ?Post partum procedures:none ?Augmentation: AROM, Pitocin, Cytotec, and IP Foley ?Complications: None ? ?Hospital course: Induction of Labor With Vaginal Delivery   ?31y.o. yo G3P1011 at 364w2das admitted to the hospital 09/23/2021 for induction of labor.  Indication for induction: Gestational hypertension.  Patient had an uncomplicated labor course as follows: ?Membrane Rupture Time/Date: 7:24 AM ,09/24/2021   ?Delivery Method:Vaginal, Spontaneous  ?Episiotomy: None  ?Lacerations:  None  ?Details of delivery can be found in separate delivery note.  Patient had a routine postpartum course. Patient is discharged home 09/25/21. ? ?Newborn Data: ?Birth date:09/24/2021  ?Birth time:10:18 AM  ?Gender:Female  ?Living status:Living  ?Apgars:9 ,9  ?Weight:3340 g  ? ?Magnesium Sulfate received: No ?BMZ received: No ?Rhophylac:No ?MMR:N/A ?T-DaP:Given prenatally ?Flu: No ?Transfusion:No ? ?Physical exam  ?Vitals:  ? 09/24/21 1715 09/24/21 2154 09/25/21 0225 09/25/21 0528  ?BP: 117/85 111/79 98/68 104/80  ?Pulse: 74 82 67 73  ?Resp: '18 18 18 18  ' ?Temp: 99 ?F (37.2 ?C) 98 ?F (36.7 ?C) 98.3 ?F (36.8 ?C) 98.4 ?F (36.9 ?C)  ?TempSrc: Oral Oral Oral   ?SpO2:  98% 98% 99%  ?Weight:      ?Height:      ? ?General: alert, cooperative, and no distress ?Lochia: appropriate ?Uterine Fundus: firm ?Incision: N/A ?DVT Evaluation: No evidence of DVT seen on physical exam. ?Negative Homan's sign. ?No cords or calf  tenderness. ?Labs: ?Lab Results  ?Component Value Date  ? WBC 8.4 09/23/2021  ? HGB 11.4 (L) 09/23/2021  ? HCT 34.2 (L) 09/23/2021  ? MCV 94.2 09/23/2021  ? PLT 189 09/23/2021  ? ?CMP Latest Ref Rng & Units 09/23/2021  ?Glucose 70 - 99 mg/dL 80  ?BUN 6 - 20 mg/dL <5(L)  ?Creatinine 0.44 - 1.00 mg/dL 0.56  ?Sodium 135 - 145 mmol/L 135  ?Potassium 3.5 - 5.1 mmol/L 3.5  ?Chloride 98 - 111 mmol/L 104  ?CO2 22 - 32 mmol/L 21(L)  ?Calcium 8.9 - 10.3 mg/dL 8.8(L)  ?Total Protein 6.5 - 8.1 g/dL 6.2(L)  ?Total Bilirubin 0.3 - 1.2 mg/dL 0.5  ?Alkaline Phos 38 - 126 U/L 106  ?AST 15 - 41 U/L 17  ?ALT 0 - 44 U/L 13  ? ?Edinburgh Score: ?Edinburgh Postnatal Depression Scale Screening Tool 09/24/2021  ?I have been able to laugh and see the funny side of things. 0  ?I have looked forward with enjoyment to things. 0  ?I have blamed myself unnecessarily when things went wrong. 0  ?I have been anxious or worried for no good reason. 0  ?I have felt scared or panicky for no good reason. 0  ?Things have been getting on top of me. 0  ?I have been so unhappy that I have had difficulty sleeping. 0  ?I have felt sad or miserable. 0  ?I have been  so unhappy that I have been crying. 0  ?The thought of harming myself has occurred to me. 0  ?Edinburgh Postnatal Depression Scale Total 0  ? ? ? ?After visit meds:  ?Allergies as of 09/25/2021   ?No Known Allergies ?  ? ?  ?Medication List  ?  ? ?STOP taking these medications   ? ?aspirin 81 MG EC tablet ?  ?budesonide-formoterol 160-4.5 MCG/ACT inhaler ?Commonly known as: Symbicort ?  ?doxylamine (Sleep) 25 MG tablet ?Commonly known as: UNISOM ?  ?ondansetron 4 MG disintegrating tablet ?Commonly known as: Zofran ODT ?  ?promethazine 25 MG tablet ?Commonly known as: PHENERGAN ?  ?pyridOXINE 100 MG tablet ?Commonly known as: VITAMIN B-6 ?  ?TUMS PO ?  ? ?  ? ?TAKE these medications   ? ?acetaminophen 325 MG tablet ?Commonly known as: Tylenol ?Take 2 tablets (650 mg total) by mouth every 4 (four) hours  as needed (for pain scale < 4). ?  ?furosemide 20 MG tablet ?Commonly known as: LASIX ?Take 1 tablet (20 mg total) by mouth daily. ?  ?ibuprofen 600 MG tablet ?Commonly known as: ADVIL ?Take 1 tablet (600 mg total) by mouth every 6 (six) hours. ?  ?norethindrone 0.35 MG tablet ?Commonly known as: Ortho Micronor ?Take 1 tablet (0.35 mg total) by mouth daily. Start taking the Sunday after the baby turns 23 weeks old ?  ?PRENATAL ADULT GUMMY/DHA/FA PO ?Take 2 tablets by mouth daily. ?  ? ?  ? ? ? ?Discharge home in stable condition ?Infant Feeding: Bottle and Breast ?Infant Disposition:home with mother ?Discharge instruction: per After Visit Summary and Postpartum booklet. ?Activity: Advance as tolerated. Pelvic rest for 6 weeks.  ?Diet: routine diet ?Future Appointments:No future appointments. ?Follow up Visit: ? ?Message sent to FT by Dr Higinio Plan:  ?Please schedule this patient for a In person postpartum visit in 6 weeks with the following provider: Any provider. ?Additional Postpartum F/U:BP check 1 week  ?High risk pregnancy complicated by: HTN ?Delivery mode:  Vaginal, Spontaneous  ?Anticipated Birth Control:  POPs ? ? ?09/25/2021 ?Christin Fudge, CNM ? ? ? ?

## 2021-09-25 ENCOUNTER — Other Ambulatory Visit (HOSPITAL_COMMUNITY): Payer: Self-pay

## 2021-09-25 MED ORDER — IBUPROFEN 600 MG PO TABS
600.0000 mg | ORAL_TABLET | Freq: Four times a day (QID) | ORAL | 0 refills | Status: DC
  Filled 2021-09-25: qty 30, 8d supply, fill #0

## 2021-09-25 MED ORDER — NORETHINDRONE 0.35 MG PO TABS
1.0000 | ORAL_TABLET | Freq: Every day | ORAL | 11 refills | Status: DC
Start: 2021-09-25 — End: 2021-09-25
  Filled 2021-09-25: qty 28, 28d supply, fill #0

## 2021-09-25 MED ORDER — ACETAMINOPHEN 325 MG PO TABS: 650.0000 mg | ORAL_TABLET | ORAL | Status: DC | PRN

## 2021-09-25 MED ORDER — NORETHINDRONE 0.35 MG PO TABS: 1.0000 | ORAL_TABLET | Freq: Every day | ORAL | 11 refills | Status: DC

## 2021-09-25 MED ORDER — FUROSEMIDE 20 MG PO TABS
20.0000 mg | ORAL_TABLET | Freq: Every day | ORAL | 0 refills | Status: DC
Start: 2021-09-25 — End: 2021-10-01
  Filled 2021-09-25: qty 5, 5d supply, fill #0

## 2021-09-25 NOTE — Lactation Note (Signed)
This note was copied from a baby's chart. ?Lactation Consultation Note ? ?Patient Name: [name] ?Today's Date: 09/25/2021 ?  ?Age:30 hours ? ?When I entered room, Dad was using a curved-tip syringe to feed infant. I discussed potential aspiration risk with parents & parents chose to bottle feed, instead. Infant did very well with a slow-flow nipple.  ? ?Based on Mom's nipple diameter, she needs a size 21 flange, which she has with her [employer]. I also provided a size 21 flange for hand pump. ? ?Mom tried to latch infant, but she wasn't really interested. Mom may need to pre-pump to elongate nipple for future latches. Mom reports that infant has latched well, previously.  ? ?A referral was sent to lactation clinic for outpatient f/u. Mom may try & latch infant again and call me before dyad leaves the hospital for me to assess latching/transfer of colostrum.  ?  ? ?[name] ?09/25/2021, 10:55 AM ? ? ? ?

## 2021-09-25 NOTE — Lactation Note (Signed)
This note was copied from a baby's chart. ?Lactation Consultation Note ? ?Patient Name: [name] ?Today's Date: 09/25/2021 ?  ?Age:30 hours ? ?LC visit attempted, but Mom was in bathroom.  ? ? ?[name] [place] ?09/25/2021, 10:38 AM ? ? ? ?

## 2021-09-30 ENCOUNTER — Encounter: Payer: 59 | Admitting: Advanced Practice Midwife

## 2021-10-01 ENCOUNTER — Ambulatory Visit (INDEPENDENT_AMBULATORY_CARE_PROVIDER_SITE_OTHER): Payer: 59

## 2021-10-01 ENCOUNTER — Other Ambulatory Visit: Payer: Self-pay

## 2021-10-01 VITALS — BP 115/81 | HR 78 | Ht 67.0 in | Wt 174.6 lb

## 2021-10-01 DIAGNOSIS — Z0130 Encounter for examination of blood pressure without abnormal findings: Secondary | ICD-10-CM

## 2021-10-01 NOTE — Progress Notes (Signed)
? ?  NURSE VISIT- BLOOD PRESSURE CHECK ? ?SUBJECTIVE:  ?[name] is a 31 y.o. [phone] female here for BP check. She is postpartum, delivery date 09/24/21    ? ?HYPERTENSION ROS: ? ?Postpartum:  ?Severe headaches that don't go away with tylenol/other medicines: No  ?Visual changes (seeing spots/double/blurred vision) No  ?Severe pain under right breast breast or in center of upper chest No  ?Severe nausea/vomiting No  ?Taking medicines as instructed not applicable ? ?OBJECTIVE:  ?BP 115/81 (BP Location: Right Arm, Patient Position: Sitting, Cuff Size: Normal)   Pulse 78   Ht 5\' 7"  (1.702 m)   Wt 174 lb 9.6 oz (79.2 kg)   LMP 01/13/2021   Breastfeeding Yes   BMI 27.35 kg/m?   ?Appearance alert, well appearing, and in no distress, oriented to person, place, and time, and normal appearing weight. ? ?ASSESSMENT: ?Postpartum  blood pressure check ? ?PLAN: ?Discussed with Dr. 03/16/2021   ?Recommendations: no changes needed   ?Follow-up: as scheduled  ? ?Naija Troost A Malini Flemings  ?10/01/2021 ?11:24 AM  ?

## 2021-10-09 ENCOUNTER — Encounter: Payer: 59 | Admitting: Women's Health

## 2021-10-13 ENCOUNTER — Inpatient Hospital Stay (HOSPITAL_COMMUNITY): Admit: 2021-10-13 | Payer: Self-pay

## 2021-10-29 ENCOUNTER — Encounter: Payer: Self-pay | Admitting: Advanced Practice Midwife

## 2021-10-29 ENCOUNTER — Ambulatory Visit (INDEPENDENT_AMBULATORY_CARE_PROVIDER_SITE_OTHER): Payer: 59 | Admitting: Advanced Practice Midwife

## 2021-10-29 NOTE — Progress Notes (Signed)
Post Partum Visit Note ? ? ?Chief Complaint:   ?Postpartum Care ? ?History of Present Illness:   ?[name] is a 31 y.o. G55P2012 Caucasian female being seen today for a postpartum visit. She is 5 weeks postpartum following a spontaneous vaginal delivery at 37.1 gestational weeks. IOL: Yes, for Gestational hypertension. Anesthesia: epidural.  Laceration: none.  Complications: none. Inpatient contraception: no.   ?Pregnancy complicated by [facility] . ?Tobacco use: no. Substance use disorder: no. ?Last pap smear: 2022 and results were NILM w/ HRHPV negative. Next pap smear due: 2025 ?Patient's last menstrual period was 01/13/2021. ? ?Postpartum course has been uncomplicated. Bleeding no bleeding. Bowel function is normal. Bladder function is normal. Urinary incontinence? No, fecal incontinence? No ?Patient is not sexually active. Last sexual activity: prior to birth.   ? Upstream - 10/29/21 1119   ? ?  ? Pregnancy Intention Screening  ? Does the patient want to become pregnant in the next year? Unsure   ? Does the patient's partner want to become pregnant in the next year? Unsure   ? Would the patient like to discuss contraceptive options today? No   ?  ? Contraception Wrap Up  ? Current Method Abstinence   ? End Method Oral Contraceptive   ? Contraception Counseling Provided No   ? ?  ?  ? ?  ? ?The pregnancy intention screening data noted above was reviewed. Potential methods of contraception were discussed. The patient elected to proceed with Oral Contraceptive. ? ?Edinburgh Postpartum Depression Screening: Negative ? Edinburgh Postnatal Depression Scale - 10/29/21 1117   ? ?  ? Edinburgh Postnatal Depression Scale:  In the Past 7 Days  ? I have been able to laugh and see the funny side of things. 0   ? I have looked forward with enjoyment to things. 0   ? I have blamed myself unnecessarily when things went wrong. 0   ? I have been anxious or worried for no good reason. 0   ? I have felt scared or panicky for no  good reason. 0   ? Things have been getting on top of me. 0   ? I have been so unhappy that I have had difficulty sleeping. 0   ? I have felt sad or miserable. 0   ? I have been so unhappy that I have been crying. 0   ? The thought of harming myself has occurred to me. 0   ? Edinburgh Postnatal Depression Scale Total 0   ? ?  ?  ? ?  ? ?Baby's course has been uncomplicated. Baby is feeding by breast: milk supply adequate. Infant has a pediatrician/family doctor? Yes.  Childcare strategy if returning to work/school: yes.  Pt has material needs met for her and baby: Yes.   ?Review of Systems:   ?Pertinent items are noted in HPI ?Denies Abnormal vaginal discharge w/ itching/odor/irritation, headaches, visual changes, shortness of breath, chest pain, abdominal pain, severe nausea/vomiting, or problems with urination or bowel movements. ?Pertinent History Reviewed:  ?Reviewed past medical,surgical, obstetrical and family history.  ?Reviewed problem list, medications and allergies. ?OB History  ?Gravida Para Term Preterm AB Living  ?_0 0 1 2  ?SAB IAB Ectopic Multiple Live Births  ?1 0 0 0 2  ?  ?# Outcome Date GA Lbr Len/2nd Weight Sex Delivery Anes PTL Lv  ?3 Term 09/24/21 38w2d20:49 7 lb 5.8 oz (3.34 kg) F Vag-Spont EPI  LIV  ?2 SAB 2018          ?  1 Term 06/06/12 58w2d53:01 / 03:15 7 lb 12.7 oz (3.535 kg) M Vag-Spont EPI  LIV  ?   Birth Comments: WNL  ? ?Physical Assessment:  ? ?Vitals:  ? 10/29/21 1114  ?BP: 107/76  ?Pulse: 81  ?Weight: 166 lb (75.3 kg)  ?Height: 5' 6" (1.676 m)  ?Body mass index is 26.79 kg/m?. ? ?Objective:  ?Blood pressure 107/76, pulse 81, height 5' 6" (1.676 m), weight 166 lb (75.3 kg), last menstrual period 01/13/2021, currently breastfeeding. ? ?General:  alert, cooperative, and no distress  ? Breasts:  negative  ?Lungs: Normal respiratory effort  ?Heart:  regular rate and rhythm  ?Abdomen: soft, non-tender,   ? Vulva:  not evaluated  ?Vagina: not evaluated  ?Cervix:  normal  ?Corpus: Well  involuted  ?Adnexa:  not evaluated  ?Rectal Exam: Not evaluated  ? ? ?      ?No results found for this or any previous visit (from the past 24 hour(s)).  ?Assessment & Plan:  ?1) Postpartum exam ?2) 5 wks s/p spontaneous vaginal delivery ?3) breast feeding ?4) Depression screening ?5) Contraception management: Has rx for micronoir. Start any time, BU for 2 weeks ? ?Essential components of care per ACOG recommendations: ? ?1.  Mood and well being:  ?If positive depression screen, discussed and plan developed.  ?If using tobacco we discussed reduction/cessation and risk of relapse ?If current substance abuse, we discussed and referral to local resources was offered.  ? ?2. Infant care and feeding:  ?If breastfeeding, discussed returning to work, pumping, breastfeeding-associated pain, guidance regarding return to fertility while lactating if not using another method. If needed, patient was provided with a letter to be allowed to pump q 2-3hrs to support lactation in a private location with access to a refrigerator to store breastmilk.   ?Recommended that all caregivers be immunized for flu, pertussis and other preventable communicable diseases ?If pt does not have material needs met for her/baby, referred to local resources for help obtaining these. ? ?3. Sexuality, contraception and birth spacing ?Provided guidance regarding sexuality, management of dyspareunia, and resumption of intercourse ?Discussed avoiding interpregnancy interval <654ms and recommended birth spacing of 18 months ? ?4. Sleep and fatigue ?Discussed coping options for fatigue and sleep disruption ?Encouraged family/partner/community support of 4 hrs of uninterrupted sleep to help with mood and fatigue ? ?5. Physical recovery  ?If pt had a C/S, assessed incisional pain and providing guidance on normal vs prolonged recovery ?If pt had a laceration, perineal healing and pain reviewed.  ?If urinary or fecal incontinence, discussed management and  referred to PT or uro/gyn if indicated  ?Patient is safe to resume physical activity. Discussed attainment of healthy weight. ? ?6.  Chronic disease management ?Discussed pregnancy complications if any, and their implications for future childbearing and long-term maternal health. ?Review recommendations for prevention of recurrent pregnancy complications, such as 17 hydroxyprogesterone caproate to reduce risk for recurrent PTB not applicable, or aspirin to reduce risk of preeclampsia yes. ?Pt had GDM: No. If yes, 2hr GTT scheduled: not applicable. ?Reviewed medications and non-pregnant dosing including consideration of whether pt is breastfeeding using a reliable resource such as LactMed: not applicable ?Referred for f/u w/ PCP or subspecialist providers as indicated: not applicable ? ?7. Health maintenance ?Mammogram at 4033yor earlier if indicated ?Pap smears as indicated ? ?Meds: No orders of the defined types were placed in this encounter. ? ? ?Follow-up: No follow-ups on file.  ? ?No orders of the defined types were  placed in this encounter. ? ? ? ? ? ?Christin Fudge DNP, CNM ?[facility] ?10/29/2021 ?9:31 PM ? ?    ? ?

## 2021-11-12 ENCOUNTER — Telehealth: Payer: 59 | Admitting: Nurse Practitioner

## 2021-11-12 DIAGNOSIS — R399 Unspecified symptoms and signs involving the genitourinary system: Secondary | ICD-10-CM | POA: Diagnosis not present

## 2021-11-12 MED ORDER — CEPHALEXIN 500 MG PO CAPS: 500.0000 mg | ORAL_CAPSULE | Freq: Two times a day (BID) | ORAL | 0 refills | Status: DC

## 2021-11-12 NOTE — Progress Notes (Signed)
? ?Virtual Visit Consent  ? ?[name], you are scheduled for a virtual visit with Mary-Margaret Daphine Deutscher, FNP, a [facility] provider, today.   ?  ?Just as with appointments in the office, your consent must be obtained to participate.  Your consent will be active for this visit and any virtual visit you may have with one of our providers in the next 365 days.   ?  ?If you have a MyChart account, a copy of this consent can be sent to you electronically.  All virtual visits are billed to your insurance company just like a traditional visit in the office.   ? ?As this is a virtual visit, video technology does not allow for your provider to perform a traditional examination.  This may limit your provider's ability to fully assess your condition.  If your provider identifies any concerns that need to be evaluated in person or the need to arrange testing (such as labs, EKG, etc.), we will make arrangements to do so.   ?  ?Although advances in technology are sophisticated, we cannot ensure that it will always work on either your end or our end.  If the connection with a video visit is poor, the visit may have to be switched to a telephone visit.  With either a video or telephone visit, we are not always able to ensure that we have a secure connection.    ? ?I need to obtain your verbal consent now.   Are you willing to proceed with your visit today? YES ?  ?[name] has provided verbal consent on 11/12/2021 for a virtual visit (video or telephone). ?  ?Mary-Margaret Daphine Deutscher, FNP  ? ?Date: 11/12/2021 4:41 PM ? ? ?Virtual Visit via Video Note  ? ?I, Mary-Margaret Daphine Deutscher, connected with [name] ([id number], 08-18-1990) on 11/12/21 at  5:00 PM EDT by a video-enabled telemedicine application and verified that I am speaking with the correct person using two identifiers. ? ?Location: ?Patient: Virtual Visit Location Patient: Home ?Provider: Virtual Visit Location Provider: Mobile ?  ?I discussed the limitations  of evaluation and management by telemedicine and the availability of in person appointments. The patient expressed understanding and agreed to proceed.   ? ?History of Present Illness: ?[name] is a 31 y.o. who identifies as a female who was assigned female at birth, and is being seen today for uti. ? ?HPI: Urinary Tract Infection  ?The current episode started yesterday. The problem occurs every urination. The problem has been gradually worsening. The quality of the pain is described as burning. The pain is at a severity of 6/10. The pain is moderate. There has been no fever. She is Sexually active. There is No history of pyelonephritis. Associated symptoms include frequency and urgency. Pertinent negatives include no chills, discharge, flank pain or hematuria. She has tried nothing for the symptoms. The treatment provided no relief.   ?Review of Systems  ?Constitutional:  Negative for chills.  ?Genitourinary:  Positive for frequency and urgency. Negative for flank pain and hematuria.  ? ?Problems:  ?Patient Active Problem List  ? Diagnosis Date Noted  ? Gestational hypertension 09/23/2021  ? History of gestational hypertension 04/06/2021  ? Encounter for supervision of normal pregnancy, antepartum 04/06/2021  ? Family history of breast cancer in mother 04/06/2021  ?  ?Allergies: No Known Allergies ?Medications:  ?Current Outpatient Medications:  ?  cephALEXin (KEFLEX) 500 MG capsule, Take 1 capsule (500 mg total) by mouth 2 (two) times daily.,  Disp: 14 capsule, Rfl: 0 ?  acetaminophen (TYLENOL) 325 MG tablet, Take 2 tablets (650 mg total) by mouth every 4 (four) hours as needed (for pain scale < 4)., Disp: , Rfl:  ?  ibuprofen (ADVIL) 600 MG tablet, Take 1 tablet (600 mg total) by mouth every 6 (six) hours., Disp: 30 tablet, Rfl: 0 ?  norethindrone (ORTHO MICRONOR) 0.35 MG tablet, Take 1 tablet (0.35 mg total) by mouth daily. Start taking the Sunday after the baby turns 54 weeks old (Patient not taking:  Reported on 10/01/2021), Disp: 28 tablet, Rfl: 11 ?  Prenatal MV & Min w/FA-DHA (PRENATAL ADULT GUMMY/DHA/FA PO), Take 2 tablets by mouth daily. (Patient not taking: Reported on 10/29/2021), Disp: , Rfl:  ? ?Observations/Objective: ?Patient is well-developed, well-nourished in no acute distress.  ?Resting comfortably at home.  ?Head is normocephalic, atraumatic.  ?No labored breathing. Speech is clear and coherent with logical content.  ?Patient is alert and oriented at baseline.  ? ?Assessment and Plan: ? ?[name] in today with chief complaint of Urinary Tract Infection ? ? ?1. UTI symptoms ?Take medication as prescribe ?Cotton underwear ?Take shower not bath ?Cranberry juice, yogurt ?Force fluids ?AZO over the counter X2 days ?RTO prn ? ?Meds ordered this encounter  ?Medications  ? cephALEXin (KEFLEX) 500 MG capsule  ?  Sig: Take 1 capsule (500 mg total) by mouth 2 (two) times daily.  ?  Dispense:  14 capsule  ?  Refill:  0  ?  Order Specific Question:   Supervising Provider  ?  Answer:   Eber Hong [[id number]]  ? ? ? ? ?Follow Up Instructions: ?I discussed the assessment and treatment plan with the patient. The patient was provided an opportunity to ask questions and all were answered. The patient agreed with the plan and demonstrated an understanding of the instructions.  A copy of instructions were sent to the patient via MyChart. ? ?The patient was advised to call back or seek an in-person evaluation if the symptoms worsen or if the condition fails to improve as anticipated. ? ?Time:  ?I spent 7 minutes with the patient via telehealth technology discussing the above problems/concerns.   ? ?Mary-Margaret Daphine Deutscher, FNP ? ?

## 2021-11-12 NOTE — Patient Instructions (Signed)
Urinary Tract Infection, Adult  A urinary tract infection (UTI) is an infection of any part of the urinary tract. The urinary tract includes the kidneys, ureters, bladder, and urethra. These organs make, store, and get rid of urine in the body. An upper UTI affects the ureters and kidneys. A lower UTI affects the bladder and urethra. What are the causes? Most urinary tract infections are caused by bacteria in your genital area around your urethra, where urine leaves your body. These bacteria grow and cause inflammation of your urinary tract. What increases the risk? You are more likely to develop this condition if: You have a urinary catheter that stays in place. You are not able to control when you urinate or have a bowel movement (incontinence). You are female and you: Use a spermicide or diaphragm for birth control. Have low estrogen levels. Are pregnant. You have certain genes that increase your risk. You are sexually active. You take antibiotic medicines. You have a condition that causes your flow of urine to slow down, such as: An enlarged prostate, if you are female. Blockage in your urethra. A kidney stone. A nerve condition that affects your bladder control (neurogenic bladder). Not getting enough to drink, or not urinating often. You have certain medical conditions, such as: Diabetes. A weak disease-fighting system (immunesystem). Sickle cell disease. Gout. Spinal cord injury. What are the signs or symptoms? Symptoms of this condition include: Needing to urinate right away (urgency). Frequent urination. This may include small amounts of urine each time you urinate. Pain or burning with urination. Blood in the urine. Urine that smells bad or unusual. Trouble urinating. Cloudy urine. Vaginal discharge, if you are female. Pain in the abdomen or the lower back. You may also have: Vomiting or a decreased appetite. Confusion. Irritability or tiredness. A fever or  chills. Diarrhea. The first symptom in older adults may be confusion. In some cases, they may not have any symptoms until the infection has worsened. How is this diagnosed? This condition is diagnosed based on your medical history and a physical exam. You may also have other tests, including: Urine tests. Blood tests. Tests for STIs (sexually transmitted infections). If you have had more than one UTI, a cystoscopy or imaging studies may be done to determine the cause of the infections. How is this treated? Treatment for this condition includes: Antibiotic medicine. Over-the-counter medicines to treat discomfort. Drinking enough water to stay hydrated. If you have frequent infections or have other conditions such as a kidney stone, you may need to see a health care provider who specializes in the urinary tract (urologist). In rare cases, urinary tract infections can cause sepsis. Sepsis is a life-threatening condition that occurs when the body responds to an infection. Sepsis is treated in the hospital with IV antibiotics, fluids, and other medicines. Follow these instructions at home:  Medicines Take over-the-counter and prescription medicines only as told by your health care provider. If you were prescribed an antibiotic medicine, take it as told by your health care provider. Do not stop using the antibiotic even if you start to feel better. General instructions Make sure you: Empty your bladder often and completely. Do not hold urine for long periods of time. Empty your bladder after sex. Wipe from front to back after urinating or having a bowel movement if you are female. Use each tissue only one time when you wipe. Drink enough fluid to keep your urine pale yellow. Keep all follow-up visits. This is important. Contact a health   care provider if: Your symptoms do not get better after 1-2 days. Your symptoms go away and then return. Get help right away if: You have severe pain in  your back or your lower abdomen. You have a fever or chills. You have nausea or vomiting. Summary A urinary tract infection (UTI) is an infection of any part of the urinary tract, which includes the kidneys, ureters, bladder, and urethra. Most urinary tract infections are caused by bacteria in your genital area. Treatment for this condition often includes antibiotic medicines. If you were prescribed an antibiotic medicine, take it as told by your health care provider. Do not stop using the antibiotic even if you start to feel better. Keep all follow-up visits. This is important. This information is not intended to replace advice given to you by your health care provider. Make sure you discuss any questions you have with your health care provider. Document Revised: 02/08/2020 Document Reviewed: 02/08/2020 Elsevier Patient Education  2023 Elsevier Inc.  

## 2021-11-19 DIAGNOSIS — Z3483 Encounter for supervision of other normal pregnancy, third trimester: Secondary | ICD-10-CM | POA: Diagnosis not present

## 2021-11-19 DIAGNOSIS — Z3482 Encounter for supervision of other normal pregnancy, second trimester: Secondary | ICD-10-CM | POA: Diagnosis not present

## 2021-12-29 ENCOUNTER — Telehealth: Payer: 59 | Admitting: Family Medicine

## 2021-12-29 DIAGNOSIS — J029 Acute pharyngitis, unspecified: Secondary | ICD-10-CM | POA: Diagnosis not present

## 2021-12-29 MED ORDER — AMOXICILLIN 500 MG PO CAPS: 500.0000 mg | ORAL_CAPSULE | Freq: Two times a day (BID) | ORAL | 0 refills | Status: AC

## 2021-12-29 NOTE — Progress Notes (Signed)
Virtual Visit Consent   [name], you are scheduled for a virtual visit with a [facility] provider today. Just as with appointments in the office, your consent must be obtained to participate. Your consent will be active for this visit and any virtual visit you may have with one of our providers in the next 365 days. If you have a MyChart account, a copy of this consent can be sent to you electronically.  As this is a virtual visit, video technology does not allow for your provider to perform a traditional examination. This may limit your provider's ability to fully assess your condition. If your provider identifies any concerns that need to be evaluated in person or the need to arrange testing (such as labs, EKG, etc.), we will make arrangements to do so. Although advances in technology are sophisticated, we cannot ensure that it will always work on either your end or our end. If the connection with a video visit is poor, the visit may have to be switched to a telephone visit. With either a video or telephone visit, we are not always able to ensure that we have a secure connection.  By engaging in this virtual visit, you consent to the provision of healthcare and authorize for your insurance to be billed (if applicable) for the services provided during this visit. Depending on your insurance coverage, you may receive a charge related to this service.  I need to obtain your verbal consent now. Are you willing to proceed with your visit today? CRISTLE JARED has provided verbal consent on 12/29/2021 for a virtual visit (video or telephone). Freddy Finner, NP  Date: 12/29/2021 11:01 AM  Virtual Visit via Video Note   I, Freddy Finner, connected with  [name]  ([id number], 09/16/1990) on 12/29/21 at 11:00 AM EDT by a video-enabled telemedicine application and verified that I am speaking with the correct person using two identifiers.  Location: Patient: Virtual Visit Location  Patient: Home Provider: Virtual Visit Location Provider: Home Office   I discussed the limitations of evaluation and management by telemedicine and the availability of in person appointments. The patient expressed understanding and agreed to proceed.    History of Present Illness: [name] is a 31 y.o. who identifies as a female who was assigned female at birth, and is being seen today for sore throat started last Sunday. Son recently had strep. Is breastfeeding her 40 month old daughter.  HPI: Sore Throat  This is a new problem. The current episode started 1 to 4 weeks ago. The problem has been unchanged. The maximum temperature recorded prior to her arrival was 100.4 - 100.9 F. The fever has been present for Less than 1 day. The pain is at a severity of 4/10. The pain is moderate. Associated symptoms include swollen glands and trouble swallowing. Pertinent negatives include no abdominal pain, congestion, coughing, diarrhea, drooling, ear discharge, ear pain, headaches, hoarse voice, plugged ear sensation, neck pain, shortness of breath, stridor or vomiting. She has had exposure to strep. She has tried acetaminophen and gargles for the symptoms. The treatment provided no relief.    Problems:  Patient Active Problem List   Diagnosis Date Noted   Gestational hypertension 09/23/2021   History of gestational hypertension 04/06/2021   Encounter for supervision of normal pregnancy, antepartum 04/06/2021   Family history of breast cancer in mother 04/06/2021    Allergies: No Known Allergies Medications:  Current Outpatient Medications:    acetaminophen (  TYLENOL) 325 MG tablet, Take 2 tablets (650 mg total) by mouth every 4 (four) hours as needed (for pain scale < 4)., Disp: , Rfl:    cephALEXin (KEFLEX) 500 MG capsule, Take 1 capsule (500 mg total) by mouth 2 (two) times daily., Disp: 14 capsule, Rfl: 0   ibuprofen (ADVIL) 600 MG tablet, Take 1 tablet (600 mg total) by mouth every 6 (six)  hours., Disp: 30 tablet, Rfl: 0   norethindrone (ORTHO MICRONOR) 0.35 MG tablet, Take 1 tablet (0.35 mg total) by mouth daily. Start taking the Sunday after the baby turns 43 weeks old (Patient not taking: Reported on 10/01/2021), Disp: 28 tablet, Rfl: 11   Prenatal MV & Min w/FA-DHA (PRENATAL ADULT GUMMY/DHA/FA PO), Take 2 tablets by mouth daily. (Patient not taking: Reported on 10/29/2021), Disp: , Rfl:   Observations/Objective: Patient is well-developed, well-nourished in no acute distress.  Resting comfortably  at home.  Head is normocephalic, atraumatic.  No labored breathing.  Speech is clear and coherent with logical content.  Patient is alert and oriented at baseline.    Assessment and Plan:  1. Pharyngitis, unspecified etiology   - amoxicillin (AMOXIL) 500 MG capsule; Take 1 capsule (500 mg total) by mouth 2 (two) times daily for 10 days.  Dispense: 20 capsule; Refill: 0  -advised of signs of side effects in infant and herself -encouraged to use sash of frozen milk if able during treatment period, but that ok if she needs to nurse.  -advised to call peds if s and S in infant of strep or medication reaction occur   Reviewed side effects, risks and benefits of medication.    Patient acknowledged agreement and understanding of the plan.   Past Medical, Surgical, Social History, Allergies, and Medications have been Reviewed.   Follow Up Instructions: I discussed the assessment and treatment plan with the patient. The patient was provided an opportunity to ask questions and all were answered. The patient agreed with the plan and demonstrated an understanding of the instructions.  A copy of instructions were sent to the patient via MyChart unless otherwise noted below.    The patient was advised to call back or seek an in-person evaluation if the symptoms worsen or if the condition fails to improve as anticipated.  Time:  I spent 15 minutes with the patient via telehealth  technology discussing the above problems/concerns.    Freddy Finner, NP

## 2021-12-29 NOTE — Patient Instructions (Signed)
Amoxicillin Capsules or Tablets What is this medication? AMOXICILLIN (a mox i SIL in) treats infections caused by bacteria. It belongs to a group of medications called penicillin antibiotics. It will not treat colds, the flu, or infections caused by viruses. This medicine may be used for other purposes; ask your health care provider or pharmacist if you have questions. COMMON BRAND NAME(S): Amoxil, Moxilin, Sumox, Trimox What should I tell my care team before I take this medication? They need to know if you have any of these conditions: Kidney disease An unusual or allergic reaction to amoxicillin, other penicillins, cephalosporin antibiotics, other medications, foods, dyes, or preservatives Pregnant or trying to get pregnant Breast-feeding How should I use this medication? Take this medication by mouth with a glass of water. Follow the directions on your prescription label. You can take it with or without food. If it upsets your stomach, take it with food. Take your medication at regular intervals. Do not take your medication more often than directed. Take all of your medication as directed even if you think you are better. Do not skip doses or stop your medication early. Talk to your pediatrician regarding the use of this medication in children. While this medication may be prescribed for selected conditions, precautions do apply. Overdosage: If you think you have taken too much of this medicine contact a poison control center or emergency room at once. NOTE: This medicine is only for you. Do not share this medicine with others. What if I miss a dose? If you miss a dose, take it as soon as you can. If it is almost time for your next dose, take only that dose. Do not take double or extra doses. What may interact with this medication? Allopurinol Birth control pills Certain antibiotics like chloramphenicol, erythromycin, sulfamethoxazole, tetracycline Certain medications that treat or prevent  blood clots like warfarin This list may not describe all possible interactions. Give your health care provider a list of all the medicines, herbs, non-prescription drugs, or dietary supplements you use. Also tell them if you smoke, drink alcohol, or use illegal drugs. Some items may interact with your medicine. What should I watch for while using this medication? Tell your care team if your symptoms do not start to get better or if they get worse. Do not treat diarrhea with over the counter products. Contact your health care professional if you have diarrhea that lasts more than 2 days or if it is severe and watery. If you have diabetes, you may get a false-positive result for sugar in your urine. Check with your health care professional. Birth control may not work properly while you are taking this medication. Talk to your health care professional about using an extra method of birth control. This medication may cause serious skin reactions. They can happen weeks to months after starting the medication. Contact your health care provider right away if you notice fevers or flu-like symptoms with a rash. The rash may be red or purple and then turn into blisters or peeling of the skin. Or, you might notice a red rash with swelling of the face, lips or lymph nodes in your neck or under your arms. What side effects may I notice from receiving this medication? Side effects that you should report to your care team as soon as possible: Allergic reactions--skin rash, itching, hives, swelling of the face, lips, tongue, or throat Redness, blistering, peeling, or loosening of the skin, including inside the mouth Severe diarrhea, fever Unusual vaginal discharge,  itching, or odor Side effects that usually do not require medical attention (report to your care team if they continue or are bothersome): Diarrhea Headache Nausea Vomiting This list may not describe all possible side effects. Call your doctor for  medical advice about side effects. You may report side effects to FDA at 1-800-FDA-1088. Where should I keep my medication? Keep out of the reach of children. Store at room temperature between 20 and 25 degrees C (68 and 77 degrees F). Throw away any unused medication after the expiration date. NOTE: This sheet is a summary. It may not cover all possible information. If you have questions about this medicine, talk to your doctor, pharmacist, or health care provider.  2023 Elsevier/Gold Standard (2020-07-08 00:00:00)

## 2022-03-26 ENCOUNTER — Other Ambulatory Visit (HOSPITAL_COMMUNITY): Payer: Self-pay

## 2022-12-24 ENCOUNTER — Telehealth: Payer: Self-pay | Admitting: Physician Assistant

## 2022-12-24 DIAGNOSIS — R3989 Other symptoms and signs involving the genitourinary system: Secondary | ICD-10-CM

## 2022-12-24 MED ORDER — CEPHALEXIN 500 MG PO CAPS: 500.0000 mg | ORAL_CAPSULE | Freq: Two times a day (BID) | ORAL | 0 refills | Status: DC

## 2022-12-24 NOTE — Progress Notes (Signed)

## 2023-05-16 ENCOUNTER — Encounter: Payer: Self-pay | Admitting: Women's Health

## 2023-05-16 ENCOUNTER — Ambulatory Visit (INDEPENDENT_AMBULATORY_CARE_PROVIDER_SITE_OTHER): Payer: Self-pay

## 2023-05-16 VITALS — BP 126/78 | HR 87 | Ht 67.0 in | Wt 170.4 lb

## 2023-05-16 DIAGNOSIS — Z3201 Encounter for pregnancy test, result positive: Secondary | ICD-10-CM

## 2023-05-16 LAB — POCT URINE PREGNANCY: Preg Test, Ur: POSITIVE — AB

## 2023-05-16 NOTE — Progress Notes (Signed)
   NURSE VISIT- PREGNANCY CONFIRMATION   SUBJECTIVE:  [name] is a 32 y.o. [phone] female at [redacted]w[redacted]d by certain LMP of Patient's last menstrual period was 04/15/2023 (exact date). Patient states this period was shorter than normal. Here for pregnancy confirmation.  Home pregnancy test: positive x 3   She reports no complaints.  She is taking prenatal vitamins.    OBJECTIVE:  BP 126/78 (BP Location: Right Arm, Patient Position: Sitting, Cuff Size: Normal)   Pulse 87   Ht 5\' 7"  (1.702 m)   Wt 170 lb 6.4 oz (77.3 kg)   LMP 04/15/2023 (Exact Date) Comment: Shorter and lighter than normal  Breastfeeding No   BMI 26.69 kg/m   Appears well, in no apparent distress  Results for orders placed or performed in visit on 05/16/23 (from the past 24 hour(s))  POCT urine pregnancy   Collection Time: 05/16/23  2:29 PM  Result Value Ref Range   Preg Test, Ur Positive (A) Negative    ASSESSMENT: Positive pregnancy test, [redacted]w[redacted]d by LMP    PLAN: Schedule for dating ultrasound in 3 weeks Prenatal vitamins: continue   Nausea medicines: not currently needed   OB packet given: Yes  Caralyn Guile  05/16/2023 2:30 PM

## 2023-05-20 ENCOUNTER — Other Ambulatory Visit: Payer: Self-pay | Admitting: Medical Genetics

## 2023-05-20 DIAGNOSIS — Z006 Encounter for examination for normal comparison and control in clinical research program: Secondary | ICD-10-CM

## 2023-05-25 ENCOUNTER — Other Ambulatory Visit (HOSPITAL_COMMUNITY)
Admission: RE | Admit: 2023-05-25 | Discharge: 2023-05-25 | Disposition: A | Discharge status: Home / Self Care | Payer: Medicaid Other | Source: Ambulatory Visit | Attending: Oncology | Admitting: Oncology

## 2023-05-25 DIAGNOSIS — Z006 Encounter for examination for normal comparison and control in clinical research program: Secondary | ICD-10-CM | POA: Insufficient documentation

## 2023-05-26 LAB — GENECONNECT MOLECULAR SCREEN

## 2023-05-30 ENCOUNTER — Telehealth: Payer: Self-pay | Admitting: Medical Genetics

## 2023-05-30 ENCOUNTER — Other Ambulatory Visit: Payer: Self-pay | Admitting: Medical Genetics

## 2023-05-30 DIAGNOSIS — Z006 Encounter for examination for normal comparison and control in clinical research program: Secondary | ICD-10-CM

## 2023-05-30 NOTE — Progress Notes (Signed)
First sample collected was unable to be analyzed and the testing was not performed. A new sample is recommended. Confirmed that the sample is on file.

## 2023-05-30 NOTE — Telephone Encounter (Signed)
[facility]  05/30/2023 2:57 PM  Confirmed I was speaking with [name] [id number] by using name and DOB. Informed participant the reason for this call is to follow-up on a recent blood sample the participant provided at one of the [facility] lab locations. Informed participant the test was not able to be completed with this sample and apologized for the inconvenience. Participant was requested to provide a new sample at one of our participating labs at no cost so that participant can continue participation and receive test results. Informed participant they do not need to be fasting and if there are other samples that need to be drawn, they can be done at the same visit. Participant has not had a blood transfusion or blood product in the last 30 days. Participant agreed to provide another sample. Participant was provided the [employer] program website to learn why this may have happened. Participant was thanked for their time and continued support of the above study.  Dr. [place] was notified to enter a new [facility] order.

## 2023-06-06 ENCOUNTER — Other Ambulatory Visit (HOSPITAL_COMMUNITY)
Admission: RE | Admit: 2023-06-06 | Discharge: 2023-06-06 | Disposition: A | Discharge status: Home / Self Care | Payer: Medicaid Other | Source: Ambulatory Visit | Attending: Oncology | Admitting: Oncology

## 2023-06-06 DIAGNOSIS — Z006 Encounter for examination for normal comparison and control in clinical research program: Secondary | ICD-10-CM | POA: Insufficient documentation

## 2023-06-07 ENCOUNTER — Other Ambulatory Visit: Payer: Self-pay | Admitting: Obstetrics & Gynecology

## 2023-06-07 DIAGNOSIS — O3680X0 Pregnancy with inconclusive fetal viability, not applicable or unspecified: Secondary | ICD-10-CM

## 2023-06-08 ENCOUNTER — Other Ambulatory Visit: Payer: Medicaid Other

## 2023-06-08 DIAGNOSIS — O3680X0 Pregnancy with inconclusive fetal viability, not applicable or unspecified: Secondary | ICD-10-CM

## 2023-06-08 DIAGNOSIS — Z3491 Encounter for supervision of normal pregnancy, unspecified, first trimester: Secondary | ICD-10-CM | POA: Diagnosis not present

## 2023-06-08 DIAGNOSIS — Z3A01 Less than 8 weeks gestation of pregnancy: Secondary | ICD-10-CM

## 2023-06-08 NOTE — Progress Notes (Signed)
[place] 7+5 wks,single IUP with yolk sac,normal ovaries,CRL 15.91 mm,FHR 160 bpm

## 2023-06-20 LAB — GENECONNECT MOLECULAR SCREEN: Genetic Analysis Overall Interpretation: NEGATIVE

## 2023-07-01 ENCOUNTER — Encounter: Payer: Self-pay | Admitting: Obstetrics & Gynecology

## 2023-07-07 ENCOUNTER — Other Ambulatory Visit (INDEPENDENT_AMBULATORY_CARE_PROVIDER_SITE_OTHER): Payer: Medicaid Other

## 2023-07-07 DIAGNOSIS — Z3A11 11 weeks gestation of pregnancy: Secondary | ICD-10-CM | POA: Diagnosis not present

## 2023-07-07 DIAGNOSIS — Z3481 Encounter for supervision of other normal pregnancy, first trimester: Secondary | ICD-10-CM

## 2023-07-07 DIAGNOSIS — Z31430 Encounter of female for testing for genetic disease carrier status for procreative management: Secondary | ICD-10-CM | POA: Diagnosis not present

## 2023-07-07 NOTE — Progress Notes (Signed)
   NURSE VISIT- [facility]  SUBJECTIVE:  [name] is a 32 y.o. [phone] female here for [facility] NIPT and [facility] Car[facility] Screening . She is [redacted]w[redacted]d pregnant.   OBJECTIVE:  Appears well, in no apparent distress  Blood work drawn from right [facility] without difficulty. 1 attempt(s).   ASSESSMENT: Pregnancy [redacted]w[redacted]d Pan[facility] NIPT and Horizon Carrier Screening  PLAN: [facility] portal information given and instructed patient how to access results Follow-up as scheduled  Jobe Marker  07/07/2023 11:04 AM

## 2023-07-12 ENCOUNTER — Other Ambulatory Visit: Payer: Self-pay | Admitting: Obstetrics & Gynecology

## 2023-07-12 ENCOUNTER — Encounter: Payer: Self-pay | Admitting: Women's Health

## 2023-07-12 DIAGNOSIS — Z3682 Encounter for antenatal screening for nuchal translucency: Secondary | ICD-10-CM

## 2023-07-12 DIAGNOSIS — Z3490 Encounter for supervision of normal pregnancy, unspecified, unspecified trimester: Secondary | ICD-10-CM | POA: Insufficient documentation

## 2023-07-13 ENCOUNTER — Encounter: Payer: Self-pay | Admitting: Obstetrics & Gynecology

## 2023-07-13 LAB — PANORAMA PRENATAL TEST FULL PANEL:PANORAMA TEST PLUS 5 ADDITIONAL MICRODELETIONS: FETAL FRACTION: 10.4

## 2023-07-13 NOTE — L&D Delivery Note (Signed)
 OB/GYN Faculty Practice Delivery Note  [name] is a 33 y.o. [id number] s/p SVD at [redacted]w[redacted]d. She was admitted for IOL cholestasis.   ROM: 6h 6m with clear fluid GBS Status:  Negative/-- (06/16 1530) Maximum Maternal Temperature: 98.40F  Labor Progress: Initial SVE: 1/50/-2. She received FB, AROM. She then progressed to complete.   Delivery Date/Time: 0356 6/23 Delivery: Called to room and patient was complete and pushing. Head delivered ROA with compound hand. No nuchal cord present. Shoulder and body delivered in usual fashion. Infant with spontaneous cry, placed on mother's abdomen, dried and stimulated. Cord clamped x 2 after 1-minute delay, and cut by FOB. Cord blood drawn. Placenta delivered spontaneously with gentle cord traction. Fundus firm with massage and Pitocin . Labia, perineum, vagina, and cervix inspected without laceration. Mom and baby doing well  Baby Weight: pending  Placenta: 3 vessel, intact. Sent to L&D Complications: None Lacerations: None EBL: 468 mL Analgesia: Epidural   Infant:  APGAR (1 MIN): 8  APGAR (5 MINS): 9   Augustin JAYSON Slade, MD [facility] Family Medicine Fellow, [facility] for [facility], [facility] 01/02/2024, 4:29 AM

## 2023-07-14 ENCOUNTER — Other Ambulatory Visit: Payer: Medicaid Other

## 2023-07-14 ENCOUNTER — Encounter: Payer: Self-pay | Admitting: Women's Health

## 2023-07-14 ENCOUNTER — Ambulatory Visit: Payer: Medicaid Other | Admitting: Women's Health

## 2023-07-14 ENCOUNTER — Encounter: Payer: Medicaid Other | Admitting: *Deleted

## 2023-07-14 VITALS — BP 120/80 | HR 84 | Wt 179.0 lb

## 2023-07-14 DIAGNOSIS — Z8759 Personal history of other complications of pregnancy, childbirth and the puerperium: Secondary | ICD-10-CM

## 2023-07-14 DIAGNOSIS — Z3682 Encounter for antenatal screening for nuchal translucency: Secondary | ICD-10-CM

## 2023-07-14 DIAGNOSIS — Z3480 Encounter for supervision of other normal pregnancy, unspecified trimester: Secondary | ICD-10-CM | POA: Diagnosis not present

## 2023-07-14 DIAGNOSIS — Z6826 Body mass index (BMI) 26.0-26.9, adult: Secondary | ICD-10-CM

## 2023-07-14 DIAGNOSIS — Z1332 Encounter for screening for maternal depression: Secondary | ICD-10-CM

## 2023-07-14 DIAGNOSIS — Z131 Encounter for screening for diabetes mellitus: Secondary | ICD-10-CM | POA: Diagnosis not present

## 2023-07-14 DIAGNOSIS — Z3A12 12 weeks gestation of pregnancy: Secondary | ICD-10-CM

## 2023-07-14 DIAGNOSIS — Z3481 Encounter for supervision of other normal pregnancy, first trimester: Secondary | ICD-10-CM | POA: Diagnosis not present

## 2023-07-14 MED ORDER — BLOOD PRESSURE MONITOR MISC: 0 refills | Status: DC

## 2023-07-14 MED ORDER — ASPIRIN 81 MG PO TBEC
162.0000 mg | DELAYED_RELEASE_TABLET | Freq: Every day | ORAL | 2 refills | Status: DC
Start: 2023-07-14 — End: 2024-01-03

## 2023-07-14 NOTE — Patient Instructions (Signed)
[name], thank you for choosing our office today! We appreciate the opportunity to meet your healthcare needs. You may receive a short survey by mail, e-mail, or through MyChart. If you are happy with your care we would appreciate if you could take just a few minutes to complete the survey questions. We read all of your comments and take your feedback very seriously. Thank you again for choosing our office.  [facility] ([address] [place], [place] [address]) Entrance C, located off of [address] Free 24/7 valet parking   Nausea & Vomiting  Have saltine crackers or pretzels by your bed and eat a few bites before you raise your head out of bed in the morning  Eat small frequent meals throughout the day instead of large meals  Drink plenty of fluids throughout the day to stay hydrated, just don't drink a lot of fluids with your meals.  This can make your stomach fill up faster making you feel sick  Do not brush your teeth right after you eat  Products with real ginger are good for nausea, like ginger ale and ginger hard candy Make sure it says made with real ginger!  Sucking on sour candy like lemon heads is also good for nausea  If your prenatal vitamins make you nauseated, take them at night so you will sleep through the nausea  Sea Bands  If you feel like you need medicine for the nausea & vomiting please let us know  If you are unable to keep any fluids or food down please let us know   Constipation  Drink plenty of fluid, preferably water, throughout the day  Eat foods high in fiber such as fruits, vegetables, and grains  Exercise, such as walking, is a good way to keep your bowels regular  Drink warm fluids, especially warm prune juice, or decaf coffee  Eat a 1/2 cup of real oatmeal (not instant), 1/2 cup applesauce, and 1/2-1 cup warm prune juice every day  If needed, you may take Colace  (docusate sodium) stool softener once or twice a day to help keep the stool soft.   If you still are having problems with constipation, you may take Miralax once daily as needed to help keep your bowels regular.   Home Blood Pressure Monitoring for Patients   Your provider has recommended that you check your blood pressure (BP) at least once a week at home. If you do not have a blood pressure cuff at home, one will be provided for you. Contact your provider if you have not received your monitor within 1 week.   Helpful Tips for Accurate Home Blood Pressure Checks  . Don't smoke, exercise, or drink caffeine 30 minutes before checking your BP . Use the restroom before checking your BP (a full bladder can raise your pressure) . Relax in a comfortable upright chair . Feet on the ground . Left arm resting comfortably on a flat surface at the level of your heart . Legs uncrossed . Back supported . Sit quietly and don't talk . Place the cuff on your bare arm . Adjust snuggly, so that only two fingertips can fit between your skin and the top of the cuff . Check 2 readings separated by at least one minute . Keep a log of your BP readings . For a visual, please reference this diagram: http://ccnc.care/bpdiagram  Provider Name: [facility]       Phone: [phone]  Zone 1: ALL CLEAR  Continue to monitor your symptoms:  . BP reading is less than 140 (top number) or less than 90 (bottom number)  . No right upper stomach pain . No headaches or seeing spots . No feeling nauseated or throwing up . No swelling in face and hands  Zone 2: CAUTION Call your doctor's office for any of the following:  . BP reading is greater than 140 (top number) or greater than 90 (bottom number)  . Stomach pain under your ribs in the middle or right side . Headaches or seeing spots . Feeling nauseated or throwing up . Swelling in face and hands  Zone 3: EMERGENCY  Seek immediate medical care if you have  any of the following:  . BP reading is greater than160 (top number) or greater than 110 (bottom number) . Severe headaches not improving with Tylenol . Serious difficulty catching your breath . Any worsening symptoms from Zone 2    First Trimester of Pregnancy The first trimester of pregnancy is from week 1 until the end of week 12 (months 1 through 3). A week after a sperm fertilizes an egg, the egg will implant on the wall of the uterus. This embryo will begin to develop into a baby. Genes from you and your partner are forming the baby. The female genes determine whether the baby is a boy or a girl. At 6-8 weeks, the eyes and face are formed, and the heartbeat can be seen on ultrasound. At the end of 12 weeks, all the baby's organs are formed.  Now that you are pregnant, you will want to do everything you can to have a healthy baby. Two of the most important things are to get good prenatal care and to follow your health care provider's instructions. Prenatal care is all the medical care you receive before the baby's birth. This care will help prevent, find, and treat any problems during the pregnancy and childbirth. BODY CHANGES Your body goes through many changes during pregnancy. The changes vary from woman to woman.   You may gain or lose a couple of pounds at first.  You may feel sick to your stomach (nauseous) and throw up (vomit). If the vomiting is uncontrollable, call your health care provider.  You may tire easily.  You may develop headaches that can be relieved by medicines approved by your health care provider.  You may urinate more often. Painful urination may mean you have a bladder infection.  You may develop heartburn as a result of your pregnancy.  You may develop constipation because certain hormones are causing the muscles that push waste through your intestines to slow down.  You may develop hemorrhoids or swollen, bulging veins (varicose veins).  Your breasts may begin  to grow larger and become tender. Your nipples may stick out more, and the tissue that surrounds them (areola) may become darker.  Your gums may bleed and may be sensitive to brushing and flossing.  Dark spots or blotches (chloasma, mask of pregnancy) may develop on your face. This will likely fade after the baby is born.  Your menstrual periods will stop.  You may have a loss of appetite.  You may develop cravings for certain kinds of food.  You may have changes in your emotions from day to day, such as being excited to be pregnant or being concerned that something may go wrong with the pregnancy and baby.  You may have more vivid and strange   dreams.  You may have changes in your hair. These can include thickening of your hair, rapid growth, and changes in texture. Some women also have hair loss during or after pregnancy, or hair that feels dry or thin. Your hair will most likely return to normal after your baby is born. WHAT TO EXPECT AT YOUR PRENATAL VISITS During a routine prenatal visit:  You will be weighed to make sure you and the baby are growing normally.  Your blood pressure will be taken.  Your abdomen will be measured to track your baby's growth.  The fetal heartbeat will be listened to starting around week 10 or 12 of your pregnancy.  Test results from any previous visits will be discussed. Your health care provider may ask you:  How you are feeling.  If you are feeling the baby move.  If you have had any abnormal symptoms, such as leaking fluid, bleeding, severe headaches, or abdominal cramping.  If you have any questions. Other tests that may be performed during your first trimester include:  Blood tests to find your blood type and to check for the presence of any previous infections. They will also be used to check for low iron levels (anemia) and Rh antibodies. Later in the pregnancy, blood tests for diabetes will be done along with other tests if problems  develop.  Urine tests to check for infections, diabetes, or protein in the urine.  An ultrasound to confirm the proper growth and development of the baby.  An amniocentesis to check for possible genetic problems.  Fetal screens for spina bifida and Down syndrome.  You may need other tests to make sure you and the baby are doing well. HOME CARE INSTRUCTIONS  Medicines  Follow your health care provider's instructions regarding medicine use. Specific medicines may be either safe or unsafe to take during pregnancy.  Take your prenatal vitamins as directed.  If you develop constipation, try taking a stool softener if your health care provider approves. Diet  Eat regular, well-balanced meals. Choose a variety of foods, such as meat or vegetable-based protein, fish, milk and low-fat dairy products, vegetables, fruits, and whole grain breads and cereals. Your health care provider will help you determine the amount of weight gain that is right for you.  Avoid raw meat and uncooked cheese. These carry germs that can cause birth defects in the baby.  Eating four or five small meals rather than three large meals a day may help relieve nausea and vomiting. If you start to feel nauseous, eating a few soda crackers can be helpful. Drinking liquids between meals instead of during meals also seems to help nausea and vomiting.  If you develop constipation, eat more high-fiber foods, such as fresh vegetables or fruit and whole grains. Drink enough fluids to keep your urine clear or pale yellow. Activity and Exercise  Exercise only as directed by your health care provider. Exercising will help you:  Control your weight.  Stay in shape.  Be prepared for labor and delivery.  Experiencing pain or cramping in the lower abdomen or low back is a good sign that you should stop exercising. Check with your health care provider before continuing normal exercises.  Try to avoid standing for long periods of  time. Move your legs often if you must stand in one place for a long time.  Avoid heavy lifting.  Wear low-heeled shoes, and practice good posture.  You may continue to have sex unless your health care provider directs   you otherwise. Relief of Pain or Discomfort  Wear a good support bra for breast tenderness.    Take warm sitz baths to soothe any pain or discomfort caused by hemorrhoids. Use hemorrhoid cream if your health care provider approves.    Rest with your legs elevated if you have leg cramps or low back pain.  If you develop varicose veins in your legs, wear support hose. Elevate your feet for 15 minutes, 3-4 times a day. Limit salt in your diet. Prenatal Care  Schedule your prenatal visits by the twelfth week of pregnancy. They are usually scheduled monthly at first, then more often in the last 2 months before delivery.  Write down your questions. Take them to your prenatal visits.  Keep all your prenatal visits as directed by your health care provider. Safety  Wear your seat belt at all times when driving.  Make a list of emergency phone numbers, including numbers for family, friends, the hospital, and police and fire departments. General Tips  Ask your health care provider for a referral to a local prenatal education class. Begin classes no later than at the beginning of month 6 of your pregnancy.  Ask for help if you have counseling or nutritional needs during pregnancy. Your health care provider can offer advice or refer you to specialists for help with various needs.  Do not use hot tubs, steam rooms, or saunas.  Do not douche or use tampons or scented sanitary pads.  Do not cross your legs for long periods of time.  Avoid cat litter boxes and soil used by cats. These carry germs that can cause birth defects in the baby and possibly loss of the fetus by miscarriage or stillbirth.  Avoid all smoking, herbs, alcohol, and medicines not prescribed by your health  care provider. Chemicals in these affect the formation and growth of the baby.  Schedule a dentist appointment. At home, brush your teeth with a soft toothbrush and be gentle when you floss. SEEK MEDICAL CARE IF:   You have dizziness.  You have mild pelvic cramps, pelvic pressure, or nagging pain in the abdominal area.  You have persistent nausea, vomiting, or diarrhea.  You have a bad smelling vaginal discharge.  You have pain with urination.  You notice increased swelling in your face, hands, legs, or ankles. SEEK IMMEDIATE MEDICAL CARE IF:   You have a fever.  You are leaking fluid from your vagina.  You have spotting or bleeding from your vagina.  You have severe abdominal cramping or pain.  You have rapid weight gain or loss.  You vomit blood or material that looks like coffee grounds.  You are exposed to German measles and have never had them.  You are exposed to fifth disease or chickenpox.  You develop a severe headache.  You have shortness of breath.  You have any kind of trauma, such as from a fall or a car accident. Document Released: 06/22/2001 Document Revised: 11/12/2013 Document Reviewed: 05/08/2013 ExitCare Patient Information 2015 [employer]. This information is not intended to replace advice given to you by your health care provider. Make sure you discuss any questions you have with your health care provider.   

## 2023-07-14 NOTE — Progress Notes (Signed)
 [place] 12+6 wks,measurements c/w dates,FHR 148 bpm,normal ovaries,posterior placenta,NT 1.6 mm,NB present,CRL  68.63 mm

## 2023-07-14 NOTE — Progress Notes (Signed)
 INITIAL OBSTETRICAL VISIT Patient name: [name] MRN [id number]  Date of birth: Nov 24, 1990 Chief Complaint:   Initial Prenatal Visit  History of Present Illness:   [name] is a 33 y.o. [id number] Caucasian female at [redacted]w[redacted]d by LMP c/w u/s at 8 weeks with an Estimated Date of Delivery: 01/20/24 being seen today for her initial obstetrical visit.   Patient's last menstrual period was 04/15/2023 (exact date). Her obstetrical history is significant for  term SVB x 2- GHTN both times, SAB x 1 .   Today she reports nausea-otc meds help. Had episode where felt like was going to pass out- had eaten a granola bar prior.  Last pap 04/06/21. Results were: NILM w/ HRHPV negative     07/14/2021    8:36 AM 04/06/2021    2:13 PM  Depression screen PHQ 2/9  Decreased Interest 0 0  Down, Depressed, Hopeless 0 0  PHQ - 2 Score 0 0  Altered sleeping 0 0  Tired, decreased energy 0 0  Change in appetite 0 0  Feeling bad or failure about yourself  0 0  Trouble concentrating 0 0  Moving slowly or fidgety/restless 0 0  Suicidal thoughts 0 0  PHQ-9 Score 0 0        07/14/2021    8:36 AM 04/06/2021    2:13 PM  GAD 7 : Generalized Anxiety Score  Nervous, Anxious, on Edge 0 0  Control/stop worrying 0 0  Worry too much - different things 0 0  Trouble relaxing 0 0  Restless 0 0  Easily annoyed or irritable 0 0  Afraid - awful might happen 0 0  Total GAD 7 Score 0 0     Review of Systems:   Pertinent items are noted in HPI Denies cramping/contractions, leakage of fluid, vaginal bleeding, abnormal vaginal discharge w/ itching/odor/irritation, headaches, visual changes, shortness of breath, chest pain, abdominal pain, severe nausea/vomiting, or problems with urination or bowel movements unless otherwise stated above.  Pertinent History Reviewed:  Reviewed past medical,surgical, social, obstetrical and family history.  Reviewed problem list, medications and allergies. OB History  Gravida  Para Term Preterm AB Living  4 2 2  0 1 2  SAB IAB Ectopic Multiple Live Births  1 0 0 0 2    # Outcome Date GA Lbr Len/2nd Weight Sex Type Anes PTL Lv  4 Current           3 Term 09/24/21 102w2d 20:49 7 lb 5.8 oz (3.34 kg) F Vag-Spont EPI N LIV     Complications: Gestational hypertension  2 SAB 2018          1 Term 06/06/12 [redacted]w[redacted]d 53:01 / 03:15 7 lb 12.7 oz (3.535 kg) M Vag-Spont EPI  LIV     Birth Comments: WNL     Complications: Gestational hypertension   Physical Assessment:   Vitals:   07/14/23 1505  BP: 120/80  Pulse: 84  Weight: 179 lb (81.2 kg)  Body mass index is 28.04 kg/m.       Physical Examination:  General appearance - well appearing, and in no distress  Mental status - alert, oriented to person, place, and time  Psych:  She has a normal mood and affect  Skin - warm and dry, normal color, no suspicious lesions noted  Chest - effort normal, all lung fields clear to auscultation bilaterally  Heart - normal rate and regular rhythm  Abdomen - soft, nontender  Extremities:  No swelling  or varicosities noted  Thin prep pap is not done   Chaperone: N/A    TODAY'S NT US  12+6 wks,measurements c/w dates,FHR 148 bpm,normal ovaries,posterior placenta,NT 1.6 mm,NB present,CRL 68.63 mm   No results found for this or any previous visit (from the past 24 hours).  Assessment & Plan:  1) Low-Risk Pregnancy [id number] at [redacted]w[redacted]d with an Estimated Date of Delivery: 01/20/24   2) Initial OB visit  3) H/O GHTN x 2> ASA 162mg , baseline labs  4) Near syncopal episode> discussed most likely reasons, to stay hydrated, increase protein   Meds:  Meds ordered this encounter  Medications   Blood Pressure Monitor MISC    Sig: For regular home bp monitoring during pregnancy    Dispense:  1 each    Refill:  0    Z34.81 Please mail to patient   aspirin  EC 81 MG tablet    Sig: Take 2 tablets (162 mg total) by mouth daily. Swallow whole.    Dispense:  180 tablet    Refill:  2     Initial labs obtained Continue prenatal vitamins Reviewed n/v relief measures and warning s/s to report Reviewed recommended weight gain based on pre-gravid BMI Encouraged well-balanced diet Genetic & carrier screening discussed: requests Panorama, NT/IT, and Horizon  Ultrasound discussed; fetal survey: requested CCNC completed> form faxed if has or is planning to apply for medicaid The nature of [employer] for [employer] with multiple MDs and other Advanced Practice Providers was explained to patient; also emphasized that fellows, residents, and students are part of our team. Does not have home bp cuff. Office bp cuff given: no. Rx sent: yes. Check bp weekly, let us  know if consistently >140/90.   Follow-up: Return in about 4 weeks (around 08/11/2023) for LROB, 2nd IT, CNM, in person; then 7wks from now for anatomy u/s and LROB w/ CNM.   Orders Placed This Encounter  Procedures   Urine Culture   GC/Chlamydia Probe Amp   Integrated 1   Hemoglobin A1c   Protein / creatinine ratio, urine   Comprehensive metabolic panel   CBC/D/Plt+RPR+Rh+ABO+RubIgG...    [name] CNM, [facility] 07/14/2023 3:35 PM

## 2023-07-15 LAB — INTEGRATED 1

## 2023-07-16 LAB — CBC/D/PLT+RPR+RH+ABO+RUBIGG...
Antibody Screen: NEGATIVE
Basophils Absolute: 0 10*3/uL (ref 0.0–0.2)
Basos: 1 %
EOS (ABSOLUTE): 0.1 10*3/uL (ref 0.0–0.4)
Eos: 1 %
HCV Ab: NONREACTIVE
HIV Screen 4th Generation wRfx: NONREACTIVE
Hematocrit: 39.8 % (ref 34.0–46.6)
Hemoglobin: 13.6 g/dL (ref 11.1–15.9)
Hepatitis B Surface Ag: NEGATIVE
Immature Grans (Abs): 0 10*3/uL (ref 0.0–0.1)
Immature Granulocytes: 0 %
Lymphocytes Absolute: 2.1 10*3/uL (ref 0.7–3.1)
Lymphs: 24 %
MCH: 32.8 pg (ref 26.6–33.0)
MCHC: 34.2 g/dL (ref 31.5–35.7)
MCV: 96 fL (ref 79–97)
Monocytes Absolute: 0.4 10*3/uL (ref 0.1–0.9)
Monocytes: 5 %
Neutrophils Absolute: 6.2 10*3/uL (ref 1.4–7.0)
Neutrophils: 69 %
Platelets: 234 10*3/uL (ref 150–450)
RBC: 4.15 x10E6/uL (ref 3.77–5.28)
RDW: 13.3 % (ref 11.7–15.4)
RPR Ser Ql: NONREACTIVE
Rh Factor: POSITIVE
Rubella Antibodies, IGG: 2.56 {index} (ref >0.99)
WBC: 8.8 10*3/uL (ref 3.4–10.8)

## 2023-07-16 LAB — COMPREHENSIVE METABOLIC PANEL
ALT: 15 [IU]/L (ref 0–32)
AST: 12 [IU]/L (ref 0–40)
Albumin: 4.2 g/dL (ref 3.9–4.9)
Alkaline Phosphatase: 72 [IU]/L (ref 44–121)
BUN/Creatinine Ratio: 13 (ref 9–23)
BUN: 7 mg/dL (ref 6–20)
Bilirubin Total: 0.2 mg/dL (ref 0.0–1.2)
CO2: 22 mmol/L (ref 20–29)
Calcium: 9.9 mg/dL (ref 8.7–10.2)
Chloride: 99 mmol/L (ref 96–106)
Creatinine, Ser: 0.52 mg/dL — ABNORMAL LOW (ref 0.57–1.00)
Globulin, Total: 2.7 g/dL (ref 1.5–4.5)
Glucose: 95 mg/dL (ref 70–99)
Potassium: 3.8 mmol/L (ref 3.5–5.2)
Sodium: 136 mmol/L (ref 134–144)
Total Protein: 6.9 g/dL (ref 6.0–8.5)
eGFR: 127 mL/min/{1.73_m2} (ref >59)

## 2023-07-16 LAB — PROTEIN / CREATININE RATIO, URINE
Creatinine, Urine: 61.5 mg/dL
Protein, Ur: 4.7 mg/dL
Protein/Creat Ratio: 76 mg/g{creat} (ref 0–200)

## 2023-07-16 LAB — INTEGRATED 1
Crown Rump Length: 68.6 mm
Gest. Age on Collection Date: 12.9 wk
PAPP-A Value: 2076.1 ng/mL
Race: 1
Sonographer ID#: 309760
Sonographer ID#: 32.4 a
Weight: 1.6 mm
Weight: 179 [lb_av]

## 2023-07-16 LAB — HEMOGLOBIN A1C
Est. average glucose Bld gHb Est-mCnc: 97 mg/dL
Hgb A1c MFr Bld: 5 % (ref 4.8–5.6)

## 2023-07-16 LAB — URINE CULTURE

## 2023-07-16 LAB — GC/CHLAMYDIA PROBE AMP
Chlamydia trachomatis, NAA: NEGATIVE
Neisseria Gonorrhoeae by PCR: NEGATIVE

## 2023-07-16 LAB — HCV INTERPRETATION

## 2023-07-18 LAB — HORIZON CUSTOM: REPORT SUMMARY: NEGATIVE

## 2023-08-11 ENCOUNTER — Encounter: Payer: Self-pay | Admitting: Women's Health

## 2023-08-11 ENCOUNTER — Ambulatory Visit (INDEPENDENT_AMBULATORY_CARE_PROVIDER_SITE_OTHER): Payer: Medicaid Other | Admitting: Women's Health

## 2023-08-11 VITALS — BP 109/75 | HR 95 | Wt 180.0 lb

## 2023-08-11 DIAGNOSIS — Z3A16 16 weeks gestation of pregnancy: Secondary | ICD-10-CM

## 2023-08-11 DIAGNOSIS — Z3480 Encounter for supervision of other normal pregnancy, unspecified trimester: Secondary | ICD-10-CM

## 2023-08-11 DIAGNOSIS — Z363 Encounter for antenatal screening for malformations: Secondary | ICD-10-CM

## 2023-08-11 DIAGNOSIS — Z3482 Encounter for supervision of other normal pregnancy, second trimester: Secondary | ICD-10-CM

## 2023-08-11 MED ORDER — BLOOD PRESSURE MONITOR MISC: 0 refills | Status: DC

## 2023-08-11 NOTE — Progress Notes (Signed)
LOW-RISK PREGNANCY VISIT Patient name: [name] MRN [id number]  Date of birth: Dec 08, 1990 Chief Complaint:   Routine Prenatal Visit  History of Present Illness:   [name] is a 33 y.o. [id number] female at [redacted]w[redacted]d with an Estimated Date of Delivery: 01/20/24 being seen today for ongoing management of a low-risk pregnancy.   Today she reports swelling, some dizzy episodes. Contractions: Not present. Vag. Bleeding: None.   . denies leaking of fluid.     07/14/2021    8:36 AM 04/06/2021    2:13 PM  Depression screen PHQ 2/9  Decreased Interest 0 0  Down, Depressed, Hopeless 0 0  PHQ - 2 Score 0 0  Altered sleeping 0 0  Tired, decreased energy 0 0  Change in appetite 0 0  Feeling bad or failure about yourself  0 0  Trouble concentrating 0 0  Moving slowly or fidgety/restless 0 0  Suicidal thoughts 0 0  PHQ-9 Score 0 0        07/14/2021    8:36 AM 04/06/2021    2:13 PM  GAD 7 : Generalized Anxiety Score  Nervous, Anxious, on Edge 0 0  Control/stop worrying 0 0  Worry too much - different things 0 0  Trouble relaxing 0 0  Restless 0 0  Easily annoyed or irritable 0 0  Afraid - awful might happen 0 0  Total GAD 7 Score 0 0      Review of Systems:   Pertinent items are noted in HPI Denies abnormal vaginal discharge w/ itching/odor/irritation, headaches, visual changes, shortness of breath, chest pain, abdominal pain, severe nausea/vomiting, or problems with urination or bowel movements unless otherwise stated above. Pertinent History Reviewed:  Reviewed past medical,surgical, social, obstetrical and family history.  Reviewed problem list, medications and allergies. Physical Assessment:   Vitals:   08/11/23 1556  BP: 109/75  Pulse: 95  Weight: 180 lb (81.6 kg)  Body mass index is 28.19 kg/m.        Physical Examination:   General appearance: Well appearing, and in no distress  Mental status: Alert, oriented to person, place, and time  Skin: Warm &  dry  Cardiovascular: Normal heart rate noted  Respiratory: Normal respiratory effort, no distress  Abdomen: Soft, gravid, nontender  Pelvic: Cervical exam deferred         Extremities: Edema: None  Fetal Status: Fetal Heart Rate (bpm): 145        Chaperone: N/A   No results found for this or any previous visit (from the past 24 hours).  Assessment & Plan:  1) Low-risk pregnancy [id number] at [redacted]w[redacted]d with an Estimated Date of Delivery: 01/20/24   2) H/O gHTN x 2, ASA   Meds:  Meds ordered this encounter  Medications   Blood Pressure Monitor MISC    Sig: For regular home bp monitoring during pregnancy    Dispense:  1 each    Refill:  0    Z34.81 Please mail to patient   Labs/procedures today: 2nd IT  Plan:  Continue routine obstetrical care  Next visit: prefers in person for u/s  Reviewed: Preterm labor symptoms and general obstetric precautions including but not limited to vaginal bleeding, contractions, leaking of fluid and fetal movement were reviewed in detail with the patient.  All questions were answered. Does have home bp cuff. Office bp cuff given: not applicable. Check bp weekly, let us know if consistently >140 and/or >90.  Follow-up: Return for As scheduled.  Future Appointments  Date Time Provider Department Center  08/31/2023  9:15 AM [facility] [place] [facility] None  08/31/2023 10:10 AM Cheral Marker, CNM [facility]    Orders Placed This Encounter  Procedures   US OB Comp + 14 Wk   INTEGRATED 2   Cheral Marker CNM, [facility] 08/11/2023 4:24 PM

## 2023-08-11 NOTE — Patient Instructions (Signed)
Victorino Dike, thank you for choosing our office today! We appreciate the opportunity to meet your healthcare needs. You may receive a short survey by mail, e-mail, or through [employer]. If you are happy with your care we would appreciate if you could take just a few minutes to complete the survey questions. We read all of your comments and take your feedback very seriously. Thank you again for choosing our office.  Center for [employer] Team at [facility] at [facility] ([address] [place], [place] [address]) Entrance C, located off of [address] Free 24/7 valet parking  Go to [email or web address] to register for FREE online childbirth classes  Call the office [phone]) or go to [facility] if: You begin to severe cramping Your water breaks.  Sometimes it is a big gush of fluid, sometimes it is just a trickle that keeps getting your panties wet or running down your legs You have vaginal bleeding.  It is normal to have a small amount of spotting if your cervix was checked.   [facility] Pediatricians/Family Doctors [facility]): [address] Colette Ribas, [phone]           [facility]: [address], [phone]                [facility] Medicine [facility]): [address], [phone] (call to ask if accepting patients) [facility] Department: [address], [place], [phone]    [facility] Pediatricians/Family Doctors [facility]): [phone] [address], [phone] [facility]: [address] [place], [phone] [facility]: [address], [phone]  [facility] Doctors  [facility]): [phone] [facility]: [address], [phone]   [facility] Doctors [facility]: [address], [phone]  [facility] Doctors  [facility]  [facility]: [phone], [phone]  Home Blood Pressure Monitoring for Patients   Your provider has recommended that you check your blood pressure (BP) at least once a week at home. If you do not have a blood pressure cuff at home, one will be provided for you. Contact your provider if you have not received your monitor within 1 week.   Helpful Tips for Accurate Home Blood Pressure Checks  Don't smoke, exercise, or drink caffeine 30 minutes before checking your BP Use the restroom before checking your BP (a full bladder can raise your pressure) Relax in a comfortable upright chair Feet on the ground Left arm resting comfortably on a flat surface at the level of your heart Legs uncrossed Back supported Sit quietly and don't talk Place the cuff on your bare arm Adjust snuggly, so that only two fingertips can fit between your skin and the top of the cuff Check 2 readings separated by at least one minute Keep a log of your BP readings For a visual, please reference this diagram: http://ccnc.care/bpdiagram  Provider Name: [facility]     Phone: [phone]  Zone 1: ALL CLEAR  Continue to monitor your symptoms:  BP reading is less than 140 (top number) or less than 90 (bottom number)  No right upper stomach pain No headaches or seeing spots No feeling nauseated or throwing up No swelling in face and hands  Zone 2: CAUTION Call your doctor's office for any of the following:  BP reading is greater than 140 (top number) or greater than  90 (bottom number)  Stomach pain under your ribs in the middle or right side Headaches or seeing spots Feeling nauseated or throwing up Swelling in face and hands  Zone 3: EMERGENCY  Seek immediate medical care if you have any of the following:  BP reading is greater than160 (top number) or greater than 110 (bottom number) Severe headaches not improving with Tylenol Serious difficulty catching your breath Any worsening symptoms from  Zone 2     Second Trimester of Pregnancy The second trimester is from week 14 through week 27 (months 4 through 6). The second trimester is often a time when you feel your best. Your body has adjusted to being pregnant, and you begin to feel better physically. Usually, morning sickness has lessened or quit completely, you may have more energy, and you may have an increase in appetite. The second trimester is also a time when the fetus is growing rapidly. At the end of the sixth month, the fetus is about 9 inches long and weighs about 1 pounds. You will likely begin to feel the baby move (quickening) between 16 and 20 weeks of pregnancy. Body changes during your second trimester Your body continues to go through many changes during your second trimester. The changes vary from woman to woman. Your weight will continue to increase. You will notice your lower abdomen bulging out. You may begin to get stretch marks on your hips, abdomen, and breasts. You may develop headaches that can be relieved by medicines. The medicines should be approved by your health care provider. You may urinate more often because the fetus is pressing on your bladder. You may develop or continue to have heartburn as a result of your pregnancy. You may develop constipation because certain hormones are causing the muscles that push waste through your intestines to slow down. You may develop hemorrhoids or swollen, bulging veins (varicose veins). You may have back pain. This is caused by: Weight gain. Pregnancy hormones that are relaxing the joints in your pelvis. A shift in weight and the muscles that support your balance. Your breasts will continue to grow and they will continue to become tender. Your gums may bleed and may be sensitive to brushing and flossing. Dark spots or blotches (chloasma, mask of pregnancy) may develop on your face. This will likely fade after the baby is born. A dark line from your belly button to  the pubic area (linea nigra) may appear. This will likely fade after the baby is born. You may have changes in your hair. These can include thickening of your hair, rapid growth, and changes in texture. Some women also have hair loss during or after pregnancy, or hair that feels dry or thin. Your hair will most likely return to normal after your baby is born.  What to expect at prenatal visits During a routine prenatal visit: You will be weighed to make sure you and the fetus are growing normally. Your blood pressure will be taken. Your abdomen will be measured to track your baby's growth. The fetal heartbeat will be listened to. Any test results from the previous visit will be discussed.  Your health care provider may ask you: How you are feeling. If you are feeling the baby move. If you have had any abnormal symptoms, such as leaking fluid, bleeding, severe headaches, or abdominal cramping. If you are using any tobacco products, including cigarettes, chewing tobacco, and electronic cigarettes. If you have any questions.  Other tests that may be performed during  your second trimester include: Blood tests that check for: Low iron levels (anemia). High blood sugar that affects pregnant women (gestational diabetes) between 71 and 28 weeks. Rh antibodies. This is to check for a protein on red blood cells (Rh factor). Urine tests to check for infections, diabetes, or protein in the urine. An ultrasound to confirm the proper growth and development of the baby. An amniocentesis to check for possible genetic problems. Fetal screens for spina bifida and Down syndrome. HIV (human immunodeficiency virus) testing. Routine prenatal testing includes screening for HIV, unless you choose not to have this test.  Follow these instructions at home: Medicines Follow your health care provider's instructions regarding medicine use. Specific medicines may be either safe or unsafe to take during  pregnancy. Take a prenatal vitamin that contains at least 600 micrograms (mcg) of folic acid. If you develop constipation, try taking a stool softener if your health care provider approves. Eating and drinking Eat a balanced diet that includes fresh fruits and vegetables, whole grains, good sources of protein such as meat, eggs, or tofu, and low-fat dairy. Your health care provider will help you determine the amount of weight gain that is right for you. Avoid raw meat and uncooked cheese. These carry germs that can cause birth defects in the baby. If you have low calcium intake from food, talk to your health care provider about whether you should take a daily calcium supplement. Limit foods that are high in fat and processed sugars, such as fried and sweet foods. To prevent constipation: Drink enough fluid to keep your urine clear or pale yellow. Eat foods that are high in fiber, such as fresh fruits and vegetables, whole grains, and beans. Activity Exercise only as directed by your health care provider. Most women can continue their usual exercise routine during pregnancy. Try to exercise for 30 minutes at least 5 days a week. Stop exercising if you experience uterine contractions. Avoid heavy lifting, wear low heel shoes, and practice good posture. A sexual relationship may be continued unless your health care provider directs you otherwise. Relieving pain and discomfort Wear a good support bra to prevent discomfort from breast tenderness. Take warm sitz baths to soothe any pain or discomfort caused by hemorrhoids. Use hemorrhoid cream if your health care provider approves. Rest with your legs elevated if you have leg cramps or low back pain. If you develop varicose veins, wear support hose. Elevate your feet for 15 minutes, 3-4 times a day. Limit salt in your diet. Prenatal Care Write down your questions. Take them to your prenatal visits. Keep all your prenatal visits as told by your health  care provider. This is important. Safety Wear your seat belt at all times when driving. Make a list of emergency phone numbers, including numbers for family, friends, the hospital, and police and fire departments. General instructions Ask your health care provider for a referral to a local prenatal education class. Begin classes no later than the beginning of month 6 of your pregnancy. Ask for help if you have counseling or nutritional needs during pregnancy. Your health care provider can offer advice or refer you to specialists for help with various needs. Do not use hot tubs, steam rooms, or saunas. Do not douche or use tampons or scented sanitary pads. Do not cross your legs for long periods of time. Avoid cat litter boxes and soil used by cats. These carry germs that can cause birth defects in the baby and possibly loss of the  fetus by miscarriage or stillbirth. Avoid all smoking, herbs, alcohol, and unprescribed drugs. Chemicals in these products can affect the formation and growth of the baby. Do not use any products that contain nicotine or tobacco, such as cigarettes and e-cigarettes. If you need help quitting, ask your health care provider. Visit your dentist if you have not gone yet during your pregnancy. Use a soft toothbrush to brush your teeth and be gentle when you floss. Contact a health care provider if: You have dizziness. You have mild pelvic cramps, pelvic pressure, or nagging pain in the abdominal area. You have persistent nausea, vomiting, or diarrhea. You have a bad smelling vaginal discharge. You have pain when you urinate. Get help right away if: You have a fever. You are leaking fluid from your vagina. You have spotting or bleeding from your vagina. You have severe abdominal cramping or pain. You have rapid weight gain or weight loss. You have shortness of breath with chest pain. You notice sudden or extreme swelling of your face, hands, ankles, feet, or legs. You  have not felt your baby move in over an hour. You have severe headaches that do not go away when you take medicine. You have vision changes. Summary The second trimester is from week 14 through week 27 (months 4 through 6). It is also a time when the fetus is growing rapidly. Your body goes through many changes during pregnancy. The changes vary from woman to woman. Avoid all smoking, herbs, alcohol, and unprescribed drugs. These chemicals affect the formation and growth your baby. Do not use any tobacco products, such as cigarettes, chewing tobacco, and e-cigarettes. If you need help quitting, ask your health care provider. Contact your health care provider if you have any questions. Keep all prenatal visits as told by your health care provider. This is important. This information is not intended to replace advice given to you by your health care provider. Make sure you discuss any questions you have with your health care provider. Document Released: 06/22/2001 Document Revised: 12/04/2015 Document Reviewed: 08/29/2012 Elsevier Interactive Patient Education  2017 [employer].

## 2023-08-13 LAB — INTEGRATED 2
AFP MoM: 0.89
Alpha-Fetoprotein: 27.4 ng/mL
Crown Rump Length: 68.6 mm
DIA MoM: 1.42
DIA Value: 205 pg/mL
Estriol, Unconjugated: 1.1 ng/mL
Gest. Age on Collection Date: 12.9 wk
Gestational Age: 16.9 wk
Maternal Age at EDD: 32.4 a
Nuchal Translucency (NT): 1.6 mm
Nuchal Translucency MoM: 0.83
Number of Fetuses: 1
PAPP-A MoM: 2.27
PAPP-A Value: 2076.1 ng/mL
Sonographer ID#: 309760
Test Results:: NEGATIVE
Weight: 179 [lb_av]
Weight: 179 [lb_av]
hCG MoM: 1.81
hCG Value: 49.2 [IU]/mL
uE3 MoM: 1.14

## 2023-08-15 ENCOUNTER — Encounter: Payer: Self-pay | Admitting: Women's Health

## 2023-08-31 ENCOUNTER — Encounter: Payer: Self-pay | Admitting: Women's Health

## 2023-08-31 ENCOUNTER — Ambulatory Visit: Payer: Medicaid Other

## 2023-08-31 ENCOUNTER — Ambulatory Visit: Payer: Medicaid Other | Admitting: Women's Health

## 2023-08-31 VITALS — BP 103/72 | HR 105 | Wt 178.2 lb

## 2023-08-31 DIAGNOSIS — Z363 Encounter for antenatal screening for malformations: Secondary | ICD-10-CM | POA: Diagnosis not present

## 2023-08-31 DIAGNOSIS — Z3A19 19 weeks gestation of pregnancy: Secondary | ICD-10-CM | POA: Diagnosis not present

## 2023-08-31 DIAGNOSIS — Z3482 Encounter for supervision of other normal pregnancy, second trimester: Secondary | ICD-10-CM | POA: Diagnosis not present

## 2023-08-31 DIAGNOSIS — Z3480 Encounter for supervision of other normal pregnancy, unspecified trimester: Secondary | ICD-10-CM

## 2023-08-31 NOTE — Progress Notes (Signed)
 [place] 19+5 wks,cephalic,cervical length 4.3 cm,posterior placenta gr 0,normal ovaries,single vertical pocket of fluid 4 cm,FHR 140 bpm,EFW 381 g 95%,anatomy complete

## 2023-08-31 NOTE — Patient Instructions (Addendum)
 [name], thank you for choosing our office today! We appreciate the opportunity to meet your healthcare needs. You may receive a short survey by mail, e-mail, or through [employer]. If you are happy with your care we would appreciate if you could take just a few minutes to complete the survey questions. We read all of your comments and take your feedback very seriously. Thank you again for choosing our office.  Center for [employer] Team at [facility] at [facility] ([address] [place], [place] [address]) Entrance C, located off of [address] Free 24/7 valet parking   Safe Medications in Pregnancy  Colds/Coughs/Allergies: Benadryl (alcohol free) 25 mg every 6 hours as needed Breath right strips Claritin Cepacol throat lozenges Chloraseptic throat spray Cold-Eeze- up to three times per day Cough drops, alcohol free Flonase (by prescription only) Guaifenesin Mucinex Robitussin DM (plain only, alcohol free) Saline nasal spray/drops Sudafed (pseudoephedrine) & Actifed ** use only after [redacted] weeks gestation and if you do not have high blood pressure Tylenol Vicks Vaporub Zinc lozenges Zyrtec     Go to [email or web address] to register for FREE online childbirth classes  Call the office [phone]) or go to [facility] if: You begin to severe cramping Your water breaks.  Sometimes it is a big gush of fluid, sometimes it is just a trickle that keeps getting your panties wet or running down your legs You have vaginal bleeding.  It is normal to have a small amount of spotting if your cervix was checked.   [facility] Pediatricians/Family Doctors [facility]): [address] Colette Ribas, [phone]           [facility]: [address], [phone]                [facility]): [address], [phone] (call to ask if accepting patients) [facility] Department:  [address], [place], [phone]    [facility] Pediatricians/Family Doctors [facility]): [phone] [address], [phone] [facility]: [address] [place], [phone] [facility]: [address], [phone]  [facility] Doctors  [facility]): [phone] [facility]: [address], [phone]   [facility] Doctors [facility]: [address], [phone]  [facility] Doctors  [facility]: [phone], [phone]  Home Blood Pressure Monitoring for Patients   Your provider has recommended that you check your blood pressure (BP) at least once a week at home. If you do not have a blood pressure cuff at home, one will be provided for you. Contact your provider if you have not received your monitor within 1 week.   Helpful Tips for Accurate Home Blood Pressure Checks  Don't smoke, exercise, or drink caffeine 30 minutes before checking your BP Use the restroom before checking your BP (a full bladder can raise your pressure) Relax in a comfortable upright chair Feet on the ground Left arm resting comfortably on a flat surface at the level of your heart Legs uncrossed Back supported Sit quietly and don't talk Place the cuff on your bare arm Adjust snuggly, so that only two fingertips can fit between your skin and the top of the cuff Check 2 readings separated by at least one minute Keep a log of your BP readings For a visual, please reference this diagram: http://ccnc.care/bpdiagram  Provider Name:  [facility]     Phone: [phone]  Zone 1: ALL CLEAR  Continue to monitor your symptoms:  BP reading is less than 140 (top number) or less than 90 (bottom number)  No right upper stomach pain No headaches or seeing spots No feeling nauseated or throwing up No swelling in face and hands  Zone 2: CAUTION Call your  doctor's office for any of the following:  BP reading is greater than 140 (top number) or greater than 90 (bottom number)  Stomach pain under your ribs in the middle or right side Headaches or seeing spots Feeling nauseated or throwing up Swelling in face and hands  Zone 3: EMERGENCY  Seek immediate medical care if you have any of the following:  BP reading is greater than160 (top number) or greater than 110 (bottom number) Severe headaches not improving with Tylenol Serious difficulty catching your breath Any worsening symptoms from Zone 2     Second Trimester of Pregnancy The second trimester is from week 14 through week 27 (months 4 through 6). The second trimester is often a time when you feel your best. Your body has adjusted to being pregnant, and you begin to feel better physically. Usually, morning sickness has lessened or quit completely, you may have more energy, and you may have an increase in appetite. The second trimester is also a time when the fetus is growing rapidly. At the end of the sixth month, the fetus is about 9 inches long and weighs about 1 pounds. You will likely begin to feel the baby move (quickening) between 16 and 20 weeks of pregnancy. Body changes during your second trimester Your body continues to go through many changes during your second trimester. The changes vary from woman to woman. Your weight will continue to increase. You will notice your lower abdomen bulging out. You may begin to get stretch marks on your hips, abdomen, and breasts. You may develop headaches that can be relieved by medicines. The medicines should be approved by your health care provider. You may urinate more often because the fetus is pressing on your bladder. You may develop or continue to have heartburn as a result of your pregnancy. You may develop constipation because certain hormones are causing the muscles that push waste through your intestines to slow down. You may develop  hemorrhoids or swollen, bulging veins (varicose veins). You may have back pain. This is caused by: Weight gain. Pregnancy hormones that are relaxing the joints in your pelvis. A shift in weight and the muscles that support your balance. Your breasts will continue to grow and they will continue to become tender. Your gums may bleed and may be sensitive to brushing and flossing. Dark spots or blotches (chloasma, mask of pregnancy) may develop on your face. This will likely fade after the baby is born. A dark line from your belly button to the pubic area (linea nigra) may appear. This will likely fade after the baby is born. You may have changes in your hair. These can include thickening of your hair, rapid growth, and changes in texture. Some women also have hair loss during or after pregnancy, or hair that feels dry or thin. Your hair will most likely return to normal after your baby is born.  What to expect at prenatal visits During a routine prenatal visit: You will be weighed to make sure you and the fetus are growing normally. Your blood pressure will be taken. Your abdomen will be measured to track  your baby's growth. The fetal heartbeat will be listened to. Any test results from the previous visit will be discussed.  Your health care provider may ask you: How you are feeling. If you are feeling the baby move. If you have had any abnormal symptoms, such as leaking fluid, bleeding, severe headaches, or abdominal cramping. If you are using any tobacco products, including cigarettes, chewing tobacco, and electronic cigarettes. If you have any questions.  Other tests that may be performed during your second trimester include: Blood tests that check for: Low iron levels (anemia). High blood sugar that affects pregnant women (gestational diabetes) between 27 and 28 weeks. Rh antibodies. This is to check for a protein on red blood cells (Rh factor). Urine tests to check for infections,  diabetes, or protein in the urine. An ultrasound to confirm the proper growth and development of the baby. An amniocentesis to check for possible genetic problems. Fetal screens for spina bifida and Down syndrome. HIV (human immunodeficiency virus) testing. Routine prenatal testing includes screening for HIV, unless you choose not to have this test.  Follow these instructions at home: Medicines Follow your health care provider's instructions regarding medicine use. Specific medicines may be either safe or unsafe to take during pregnancy. Take a prenatal vitamin that contains at least 600 micrograms (mcg) of folic acid. If you develop constipation, try taking a stool softener if your health care provider approves. Eating and drinking Eat a balanced diet that includes fresh fruits and vegetables, whole grains, good sources of protein such as meat, eggs, or tofu, and low-fat dairy. Your health care provider will help you determine the amount of weight gain that is right for you. Avoid raw meat and uncooked cheese. These carry germs that can cause birth defects in the baby. If you have low calcium intake from food, talk to your health care provider about whether you should take a daily calcium supplement. Limit foods that are high in fat and processed sugars, such as fried and sweet foods. To prevent constipation: Drink enough fluid to keep your urine clear or pale yellow. Eat foods that are high in fiber, such as fresh fruits and vegetables, whole grains, and beans. Activity Exercise only as directed by your health care provider. Most women can continue their usual exercise routine during pregnancy. Try to exercise for 30 minutes at least 5 days a week. Stop exercising if you experience uterine contractions. Avoid heavy lifting, wear low heel shoes, and practice good posture. A sexual relationship may be continued unless your health care provider directs you otherwise. Relieving pain and  discomfort Wear a good support bra to prevent discomfort from breast tenderness. Take warm sitz baths to soothe any pain or discomfort caused by hemorrhoids. Use hemorrhoid cream if your health care provider approves. Rest with your legs elevated if you have leg cramps or low back pain. If you develop varicose veins, wear support hose. Elevate your feet for 15 minutes, 3-4 times a day. Limit salt in your diet. Prenatal Care Write down your questions. Take them to your prenatal visits. Keep all your prenatal visits as told by your health care provider. This is important. Safety Wear your seat belt at all times when driving. Make a list of emergency phone numbers, including numbers for family, friends, the hospital, and police and fire departments. General instructions Ask your health care provider for a referral to a local prenatal education class. Begin classes no later than the beginning of month 6 of your pregnancy.  Ask for help if you have counseling or nutritional needs during pregnancy. Your health care provider can offer advice or refer you to specialists for help with various needs. Do not use hot tubs, steam rooms, or saunas. Do not douche or use tampons or scented sanitary pads. Do not cross your legs for long periods of time. Avoid cat litter boxes and soil used by cats. These carry germs that can cause birth defects in the baby and possibly loss of the fetus by miscarriage or stillbirth. Avoid all smoking, herbs, alcohol, and unprescribed drugs. Chemicals in these products can affect the formation and growth of the baby. Do not use any products that contain nicotine or tobacco, such as cigarettes and e-cigarettes. If you need help quitting, ask your health care provider. Visit your dentist if you have not gone yet during your pregnancy. Use a soft toothbrush to brush your teeth and be gentle when you floss. Contact a health care provider if: You have dizziness. You have mild pelvic  cramps, pelvic pressure, or nagging pain in the abdominal area. You have persistent nausea, vomiting, or diarrhea. You have a bad smelling vaginal discharge. You have pain when you urinate. Get help right away if: You have a fever. You are leaking fluid from your vagina. You have spotting or bleeding from your vagina. You have severe abdominal cramping or pain. You have rapid weight gain or weight loss. You have shortness of breath with chest pain. You notice sudden or extreme swelling of your face, hands, ankles, feet, or legs. You have not felt your baby move in over an hour. You have severe headaches that do not go away when you take medicine. You have vision changes. Summary The second trimester is from week 14 through week 27 (months 4 through 6). It is also a time when the fetus is growing rapidly. Your body goes through many changes during pregnancy. The changes vary from woman to woman. Avoid all smoking, herbs, alcohol, and unprescribed drugs. These chemicals affect the formation and growth your baby. Do not use any tobacco products, such as cigarettes, chewing tobacco, and e-cigarettes. If you need help quitting, ask your health care provider. Contact your health care provider if you have any questions. Keep all prenatal visits as told by your health care provider. This is important. This information is not intended to replace advice given to you by your health care provider. Make sure you discuss any questions you have with your health care provider. Document Released: 06/22/2001 Document Revised: 12/04/2015 Document Reviewed: 08/29/2012 Elsevier Interactive Patient Education  2017 [employer].

## 2023-08-31 NOTE — Progress Notes (Signed)
 LOW-RISK PREGNANCY VISIT Patient name: [name] MRN [id number]  Date of birth: [birth year] Chief Complaint:   Routine Prenatal Visit  History of Present Illness:   [name] is a 33 y.o. [id number] female at [redacted]w[redacted]d with an Estimated Date of Delivery: 01/20/24 being seen today for ongoing management of a low-risk pregnancy.   Today she reports  cough, no other sx. Kids had flu last week.  . Contractions: Not present. Vag. Bleeding: None.  Movement: Present. denies leaking of fluid.     07/14/2021    8:36 AM 04/06/2021    2:13 PM  Depression screen PHQ 2/9  Decreased Interest 0 0  Down, Depressed, Hopeless 0 0  PHQ - 2 Score 0 0  Altered sleeping 0 0  Tired, decreased energy 0 0  Change in appetite 0 0  Feeling bad or failure about yourself  0 0  Trouble concentrating 0 0  Moving slowly or fidgety/restless 0 0  Suicidal thoughts 0 0  PHQ-9 Score 0 0        07/14/2021    8:36 AM 04/06/2021    2:13 PM  GAD 7 : Generalized Anxiety Score  Nervous, Anxious, on Edge 0 0  Control/stop worrying 0 0  Worry too much - different things 0 0  Trouble relaxing 0 0  Restless 0 0  Easily annoyed or irritable 0 0  Afraid - awful might happen 0 0  Total GAD 7 Score 0 0      Review of Systems:   Pertinent items are noted in HPI Denies abnormal vaginal discharge w/ itching/odor/irritation, headaches, visual changes, shortness of breath, chest pain, abdominal pain, severe nausea/vomiting, or problems with urination or bowel movements unless otherwise stated above. Pertinent History Reviewed:  Reviewed past medical,surgical, social, obstetrical and family history.  Reviewed problem list, medications and allergies. Physical Assessment:   Vitals:   08/31/23 1011  BP: 103/72  Pulse: (!) 105  Weight: 178 lb 3.2 oz (80.8 kg)  Body mass index is 27.91 kg/m.        Physical Examination:   General appearance: Well appearing, and in no distress  Mental status: Alert, oriented to  person, place, and time  Skin: Warm & dry  Cardiovascular: Normal heart rate noted  Respiratory: Normal respiratory effort, no distress  Abdomen: Soft, gravid, nontender  Pelvic: Cervical exam deferred         Extremities: Edema: None  Fetal Status:     Movement: Present  [place] 19+5 wks,cephalic,cervical length 4.3 cm,posterior placenta gr 0,normal ovaries,single vertical pocket of fluid 4 cm,FHR 140 bpm,EFW 381 g 95%,anatomy complete   Chaperone: N/A   No results found for this or any previous visit (from the past 24 hours).  Assessment & Plan:  1) Low-risk pregnancy [id number] at [redacted]w[redacted]d with an Estimated Date of Delivery: 01/20/24   2) Cough, kids had flu, discussed and gave printed list of otc meds if needed  3) H/O GHTN> ASA  4) Fetal EFW 95% today> consider EFW 36w   Meds: No orders of the defined types were placed in this encounter.  Labs/procedures today: U/S  Plan:  Continue routine obstetrical care  Next visit: prefers online    Reviewed: Preterm labor symptoms and general obstetric precautions including but not limited to vaginal bleeding, contractions, leaking of fluid and fetal movement were reviewed in detail with the patient.  All questions were answered. Does not have home bp cuff, can check at work.   Follow-up:  Return in about 4 weeks (around 09/28/2023) for LROB, CNM, MyChart Video.  Future Appointments  Date Time Provider Department Center  09/28/2023 10:10 AM Cheral Marker, CNM [facility]    No orders of the defined types were placed in this encounter.  Cheral Marker CNM, [facility] 08/31/2023 10:31 AM

## 2023-09-01 ENCOUNTER — Encounter: Payer: Medicaid Other | Admitting: Advanced Practice Midwife

## 2023-09-01 ENCOUNTER — Other Ambulatory Visit: Payer: Medicaid Other

## 2023-09-01 ENCOUNTER — Telehealth: Payer: Medicaid Other | Admitting: Physician Assistant

## 2023-09-01 DIAGNOSIS — R3989 Other symptoms and signs involving the genitourinary system: Secondary | ICD-10-CM | POA: Diagnosis not present

## 2023-09-01 DIAGNOSIS — Z3A19 19 weeks gestation of pregnancy: Secondary | ICD-10-CM | POA: Diagnosis not present

## 2023-09-01 MED ORDER — CEPHALEXIN 500 MG PO CAPS: 500.0000 mg | ORAL_CAPSULE | Freq: Two times a day (BID) | ORAL | 0 refills | Status: DC

## 2023-09-01 NOTE — Patient Instructions (Signed)
 [name], thank you for joining Margaretann Loveless, PA-C for today's virtual visit.  While this provider is not your primary care provider (PCP), if your PCP is located in our provider database this encounter information will be shared with them immediately following your visit.   A [facility] MyChart account gives you access to today's visit and all your visits, tests, and labs performed at [facility] " click here if you don't have a [place] MyChart account or go to [email or web address]  Consent: (Patient) [name] provided verbal consent for this virtual visit at the beginning of the encounter.  Current Medications:  Current Outpatient Medications:    cephALEXin (KEFLEX) 500 MG capsule, Take 1 capsule (500 mg total) by mouth 2 (two) times daily., Disp: 14 capsule, Rfl: 0   acetaminophen (TYLENOL) 325 MG tablet, Take 2 tablets (650 mg total) by mouth every 4 (four) hours as needed (for pain scale < 4). (Patient not taking: Reported on 08/31/2023), Disp: , Rfl:    aspirin EC 81 MG tablet, Take 2 tablets (162 mg total) by mouth daily. Swallow whole. (Patient not taking: Reported on 08/31/2023), Disp: 180 tablet, Rfl: 2   Blood Pressure Monitor MISC, For regular home bp monitoring during pregnancy (Patient not taking: Reported on 08/31/2023), Disp: 1 each, Rfl: 0   doxylamine, Sleep, (UNISOM) 25 MG tablet, Take 12.5 mg by mouth at bedtime as needed., Disp: , Rfl:    OVER THE COUNTER MEDICATION, Prenatal gummies, Disp: , Rfl:    pyridoxine (B-6) 100 MG tablet, Take 100 mg by mouth daily., Disp: , Rfl:    Medications ordered in this encounter:  Meds ordered this encounter  Medications   cephALEXin (KEFLEX) 500 MG capsule    Sig: Take 1 capsule (500 mg total) by mouth 2 (two) times daily.    Dispense:  14 capsule    Refill:  0    Supervising Provider:   Merrilee Jansky [[id number]]     *If you need refills on other medications prior to your next  appointment, please contact your pharmacy*  Follow-Up: Call back or seek an in-person evaluation if the symptoms worsen or if the condition fails to improve as anticipated.  [facility] [phone]  Other Instructions  Urinary Tract Infection, Female A urinary tract infection (UTI) is an infection in your urinary tract. The urinary tract is made up of organs that make, store, and get rid of pee (urine) in your body. These organs include: The kidneys. The ureters. The bladder. The urethra. What are the causes? Most UTIs are caused by germs called bacteria. They may be in or near your genitals. These germs grow and cause swelling in your urinary tract. What increases the risk? You're more likely to get a UTI if: You're a female. The urethra is shorter in females than in males. You have a soft tube called a catheter that drains your pee. You can't control when you pee or poop. You have trouble peeing because of: A kidney stone. A urinary blockage. A nerve condition that affects your bladder. Not getting enough to drink. You're sexually active. You use a birth control inside your vagina, like spermicide. You're pregnant. You have low levels of the hormone estrogen in your body. You're an older adult. You're also more likely to get a UTI if you have other health problems. These may include: Diabetes. A weak immune system. Your immune system is your body's defense system. Sickle cell disease.  Injury of the spine. What are the signs or symptoms? Symptoms may include: Needing to pee right away. Peeing small amounts often. Pain or burning when you pee. Blood in your pee. Pee that smells bad or odd. Pain in your belly or lower back. You may also: Feel confused. This may be the first symptom in older adults. Vomit. Not feel hungry. Feel tired or easily annoyed. Have a fever or chills. How is this diagnosed? A UTI is diagnosed based on your medical history and  an exam. You may also have other tests. These may include: Pee tests. Blood tests. Tests for sexually transmitted infections (STIs). If you've had more than one UTI, you may need to have imaging studies done to find out why you keep getting them. How is this treated? A UTI can be treated by: Taking antibiotics or other medicines. Drinking enough fluid to keep your pee pale yellow. In rare cases, a UTI can cause a very bad condition called sepsis. Sepsis may be treated in the hospital. Follow these instructions at home: Medicines Take your medicines only as told by your health care provider. If you were given antibiotics, take them as told by your provider. Do not stop taking them even if you start to feel better. General instructions Make sure you: Pee often and fully. Do not hold your pee for a long time. Wipe from front to back after you pee or poop. Use each tissue only once when you wipe. Pee after you have sex. Do not douche or use sprays or powders in your genital area. Contact a health care provider if: Your symptoms don't get better after 1-2 days of taking antibiotics. Your symptoms go away and then come back. You have a fever or chills. You vomit or feel like you may vomit. Get help right away if: You have very bad pain in your back or lower belly. You faint. This information is not intended to replace advice given to you by your health care provider. Make sure you discuss any questions you have with your health care provider. Document Revised: 02/03/2023 Document Reviewed: 10/01/2022 Elsevier Patient Education  2024 Elsevier Inc.   If you have been instructed to have an in-person evaluation today at a local Urgent Care facility, please use the link below. It will take you to a list of all of our available [facility], including address, phone number and hours of operation. Please do not delay care.  [facility]  If you or a family member do  not have a primary care provider, use the link below to schedule a visit and establish care. When you choose a [facility] primary care physician or advanced practice provider, you gain a long-term partner in health. Find a Primary Care Provider  Learn more about [facility] in-office and virtual care options: [place] - Get Care Now

## 2023-09-01 NOTE — Progress Notes (Signed)
 Virtual Visit Consent   [name], you are scheduled for a virtual visit with a [facility] provider today. Just as with appointments in the office, your consent must be obtained to participate. Your consent will be active for this visit and any virtual visit you may have with one of our providers in the next 365 days. If you have a MyChart account, a copy of this consent can be sent to you electronically.  As this is a virtual visit, video technology does not allow for your provider to perform a traditional examination. This may limit your provider's ability to fully assess your condition. If your provider identifies any concerns that need to be evaluated in person or the need to arrange testing (such as labs, EKG, etc.), we will make arrangements to do so. Although advances in technology are sophisticated, we cannot ensure that it will always work on either your end or our end. If the connection with a video visit is poor, the visit may have to be switched to a telephone visit. With either a video or telephone visit, we are not always able to ensure that we have a secure connection.  By engaging in this virtual visit, you consent to the provision of healthcare and authorize for your insurance to be billed (if applicable) for the services provided during this visit. Depending on your insurance coverage, you may receive a charge related to this service.  I need to obtain your verbal consent now. Are you willing to proceed with your visit today? KHOLE ARTERBURN has provided verbal consent on 32/20/2025 for a virtual visit (video or telephone). Margaretann Loveless, PA-C  Date: 09/01/2023 1:49 PM   Virtual Visit via Video Note   I, Margaretann Loveless, connected with  [name]  ([id number], April 22, 1991) on 09/01/23 at  1:45 PM EST by a video-enabled telemedicine application and verified that I am speaking with the correct person using two identifiers.  Location: Patient: Virtual Visit  Location Patient: Home Provider: Virtual Visit Location Provider: Home Office   I discussed the limitations of evaluation and management by telemedicine and the availability of in person appointments. The patient expressed understanding and agreed to proceed.    History of Present Illness: [name] is a 33 y.o. who identifies as a female who was assigned female at birth, and is being seen today for dysuria.  HPI: Urinary Tract Infection  This is a new problem. The current episode started today. The problem occurs every urination. The problem has been gradually worsening. The quality of the pain is described as burning. The pain is mild. There has been no fever. Associated symptoms include frequency, hesitancy, a possible pregnancy (confirmed pregnancy) and urgency. Pertinent negatives include no chills, discharge, flank pain, hematuria or nausea. She has tried increased fluids for the symptoms. The treatment provided no relief. Her past medical history is significant for recurrent UTIs.  Patient is 19 weeks 6 days pregnant     Problems:  Patient Active Problem List   Diagnosis Date Noted   Encounter for supervision of normal pregnancy, antepartum 07/12/2023   History of gestational hypertension 04/06/2021   Family history of breast cancer in mother 04/06/2021    Allergies: No Known Allergies Medications:  Current Outpatient Medications:    cephALEXin (KEFLEX) 500 MG capsule, Take 1 capsule (500 mg total) by mouth 2 (two) times daily., Disp: 14 capsule, Rfl: 0   acetaminophen (TYLENOL) 325 MG tablet, Take 2 tablets (650 mg total)  by mouth every 4 (four) hours as needed (for pain scale < 4). (Patient not taking: Reported on 08/31/2023), Disp: , Rfl:    aspirin EC 81 MG tablet, Take 2 tablets (162 mg total) by mouth daily. Swallow whole. (Patient not taking: Reported on 08/31/2023), Disp: 180 tablet, Rfl: 2   Blood Pressure Monitor MISC, For regular home bp monitoring during pregnancy  (Patient not taking: Reported on 08/31/2023), Disp: 1 each, Rfl: 0   doxylamine, Sleep, (UNISOM) 25 MG tablet, Take 12.5 mg by mouth at bedtime as needed., Disp: , Rfl:    OVER THE COUNTER MEDICATION, Prenatal gummies, Disp: , Rfl:    pyridoxine (B-6) 100 MG tablet, Take 100 mg by mouth daily., Disp: , Rfl:   Observations/Objective: Patient is well-developed, well-nourished in no acute distress.  Resting comfortably at home.  Head is normocephalic, atraumatic.  No labored breathing.  Speech is clear and coherent with logical content.  Patient is alert and oriented at baseline.    Assessment and Plan: 1. Suspected UTI (Primary) - cephALEXin (KEFLEX) 500 MG capsule; Take 1 capsule (500 mg total) by mouth 2 (two) times daily.  Dispense: 14 capsule; Refill: 0  2. [redacted] weeks gestation of pregnancy  - Worsening symptoms.  - Will treat empirically with Keflex - May use AZO for bladder spasms - Continue to push fluids.  - Seek in person evaluation for urine culture if symptoms do not improve or if they worsen.    Follow Up Instructions: I discussed the assessment and treatment plan with the patient. The patient was provided an opportunity to ask questions and all were answered. The patient agreed with the plan and demonstrated an understanding of the instructions.  A copy of instructions were sent to the patient via MyChart unless otherwise noted below.    The patient was advised to call back or seek an in-person evaluation if the symptoms worsen or if the condition fails to improve as anticipated.    Margaretann Loveless, PA-C

## 2023-09-06 DIAGNOSIS — Z3480 Encounter for supervision of other normal pregnancy, unspecified trimester: Secondary | ICD-10-CM | POA: Diagnosis not present

## 2023-09-28 ENCOUNTER — Telehealth (INDEPENDENT_AMBULATORY_CARE_PROVIDER_SITE_OTHER): Payer: Medicaid Other | Admitting: Women's Health

## 2023-09-28 ENCOUNTER — Encounter: Payer: Self-pay | Admitting: Women's Health

## 2023-09-28 VITALS — BP 102/69 | HR 104 | Wt 178.0 lb

## 2023-09-28 DIAGNOSIS — Z3A23 23 weeks gestation of pregnancy: Secondary | ICD-10-CM | POA: Diagnosis not present

## 2023-09-28 DIAGNOSIS — Z3480 Encounter for supervision of other normal pregnancy, unspecified trimester: Secondary | ICD-10-CM

## 2023-09-28 DIAGNOSIS — Z3482 Encounter for supervision of other normal pregnancy, second trimester: Secondary | ICD-10-CM | POA: Diagnosis not present

## 2023-09-28 NOTE — Progress Notes (Signed)
 TELEHEALTH VIRTUAL OBSTETRICS VISIT ENCOUNTER NOTE Patient name: [name] MRN [id number]  Date of birth: [birth year]  I connected with patient on 09/28/23 at 10:10 AM EDT by MyChart video  and verified that I am speaking with the correct person using two identifiers. Pt is not currently in our office, she is at work.  The provider is in the office.    I discussed the limitations, risks, security and privacy concerns of performing an evaluation and management service by telephone and the availability of in person appointments. I also discussed with the patient that there may be a patient responsible charge related to this service. The patient expressed understanding and agreed to proceed.  Chief Complaint:   Routine Prenatal Visit (MY chart visit. Swelling leg and ankle)  History of Present Illness:   [name] is a 33 y.o. [phone] female at [redacted]w[redacted]d with an Estimated Date of Delivery: 01/20/24 being evaluated today for ongoing management of a low-risk pregnancy.     07/14/2021    8:36 AM 04/06/2021    2:13 PM  Depression screen PHQ 2/9  Decreased Interest 0 0  Down, Depressed, Hopeless 0 0  PHQ - 2 Score 0 0  Altered sleeping 0 0  Tired, decreased energy 0 0  Change in appetite 0 0  Feeling bad or failure about yourself  0 0  Trouble concentrating 0 0  Moving slowly or fidgety/restless 0 0  Suicidal thoughts 0 0  PHQ-9 Score 0 0    Today she reports  feet/legs swelling . Contractions: Not present.  .  Movement: Present. denies leaking of fluid. Review of Systems:   Pertinent items are noted in HPI Denies abnormal vaginal discharge w/ itching/odor/irritation, headaches, visual changes, shortness of breath, chest pain, abdominal pain, severe nausea/vomiting, or problems with urination or bowel movements unless otherwise stated above. Pertinent History Reviewed:  Reviewed past medical,surgical, social, obstetrical and family history.  Reviewed problem list, medications and  allergies. Physical Assessment:   Vitals:   09/28/23 1015  BP: 102/69  Pulse: (!) 104  Weight: 178 lb (80.7 kg)  Body mass index is 27.88 kg/m.        Physical Examination:   General:  Alert, oriented and cooperative.   Mental Status: Normal mood and affect perceived. Normal judgment and thought content.  Rest of physical exam deferred due to type of encounter  No results found for this or any previous visit (from the past 24 hours).  Assessment & Plan:  1) Pregnancy [id number] at [redacted]w[redacted]d with an Estimated Date of Delivery: 01/20/24   2) H/O GHTN, ASA, check bp's, reviewed pre-e s/s, reasons to seek care  3) Swelling> bp great, can try compression hose/elevating legs   Meds: No orders of the defined types were placed in this encounter.   Labs/procedures today: none  Plan:  Continue routine obstetrical care.  Does have home bp cuff. Office bp cuff given: not applicable. Check bp weekly, let us know if consistently >140 and/or >90.  Next visit: prefers will be in person for pn2     Reviewed: Preterm labor symptoms and general obstetric precautions including but not limited to vaginal bleeding, contractions, leaking of fluid and fetal movement were reviewed in detail with the patient. The patient was advised to call back or seek an in-person office evaluation/go to [facility] at [facility] for any urgent or concerning symptoms. All questions were answered. Please refer to After Visit Summary for other counseling recommendations.  I provided 10 minutes of non-face-to-face time during this encounter.  Follow-up: Return in about 4 weeks (around 10/26/2023) for LROB, PN2, CNM, in person.  No orders of the defined types were placed in this encounter.  Cheral Marker CNM, [facility] 09/28/2023 10:35 AM

## 2023-09-28 NOTE — Patient Instructions (Signed)
 [name], thank you for choosing our office today! We appreciate the opportunity to meet your healthcare needs. You may receive a short survey by mail, e-mail, or through [employer]. If you are happy with your care we would appreciate if you could take just a few minutes to complete the survey questions. We read all of your comments and take your feedback very seriously. Thank you again for choosing our office.  Center for [employer] Team at [facility]  [facility] at [facility] ([address] [place], [place] [address]) Entrance C, located off of [address] Free 24/7 valet parking   You will have your sugar test next visit.  Please do not eat or drink anything after midnight the night before you come, not even water.  You will be here for at least two hours.  Please make an appointment online for the bloodwork at [email or web address] for 8:00am (or as close to this as possible). Make sure you select the [facility] service center.   CLASSES: Go to [email or web address] to register for classes (childbirth, breastfeeding, waterbirth, infant CPR, daddy bootcamp, etc.)  Call the office [phone]) or go to [facility] if: You begin to have strong, frequent contractions Your water breaks.  Sometimes it is a big gush of fluid, sometimes it is just a trickle that keeps getting your panties wet or running down your legs You have vaginal bleeding.  It is normal to have a small amount of spotting if your cervix was checked.  You don't feel your baby moving like normal.  If you don't, get you something to eat and drink and lay down and focus on feeling your baby move.   If your baby is still not moving like normal, you should call the office or go to [facility]  Call the office [phone]) or go to [facility] for these signs of pre-eclampsia: Severe headache that does not go away with Tylenol Visual changes- seeing spots, double, blurred vision Pain under your right breast or  upper abdomen that does not go away with Tums or heartburn medicine Nausea and/or vomiting Severe swelling in your hands, feet, and face    [facility]/Family Doctors [facility]): [address] Colette Ribas, [phone]           [facility]: [address], [phone]                [facility]): [address], [phone]  [facility] Department: [address], [place], [phone]    [facility] Pediatricians/Family Doctors [facility]): [address], [phone] [facility]: [address] [place], [phone] [facility]: [address], [phone]  [facility] Doctors  [facility]): [phone] [facility]: [address] [phone]   [facility] Doctors [facility]: [address], [phone]  [facility] Doctors  [facility]: [phone], [phone]  Home Blood Pressure Monitoring for Patients   Your provider has recommended that you check your blood pressure (BP) at least once a week at home. If you do not have a blood pressure cuff at home, one will be provided for you. Contact your provider if you have not received your monitor within 1 week.   Helpful Tips for Accurate Home Blood Pressure Checks  Don't smoke, exercise, or drink caffeine 30 minutes before checking  your BP Use the restroom before checking your BP (a full bladder can raise your pressure) Relax in a comfortable upright chair Feet on the ground Left arm resting comfortably on a flat surface at the level of your heart Legs uncrossed Back supported Sit quietly and don't talk Place the cuff on your bare arm Adjust snuggly, so that only two fingertips can fit between your skin and the top of the cuff Check 2 readings separated by at least one  minute Keep a log of your BP readings For a visual, please reference this diagram: http://ccnc.care/bpdiagram  Provider Name: [facility]     Phone: [phone]  Zone 1: ALL CLEAR  Continue to monitor your symptoms:  BP reading is less than 140 (top number) or less than 90 (bottom number)  No right upper stomach pain No headaches or seeing spots No feeling nauseated or throwing up No swelling in face and hands  Zone 2: CAUTION Call your doctor's office for any of the following:  BP reading is greater than 140 (top number) or greater than 90 (bottom number)  Stomach pain under your ribs in the middle or right side Headaches or seeing spots Feeling nauseated or throwing up Swelling in face and hands  Zone 3: EMERGENCY  Seek immediate medical care if you have any of the following:  BP reading is greater than160 (top number) or greater than 110 (bottom number) Severe headaches not improving with Tylenol Serious difficulty catching your breath Any worsening symptoms from Zone 2   Second Trimester of Pregnancy The second trimester is from week 13 through week 28, months 4 through 6. The second trimester is often a time when you feel your best. Your body has also adjusted to being pregnant, and you begin to feel better physically. Usually, morning sickness has lessened or quit completely, you may have more energy, and you may have an increase in appetite. The second trimester is also a time when the fetus is growing rapidly. At the end of the sixth month, the fetus is about 9 inches long and weighs about 1 pounds. You will likely begin to feel the baby move (quickening) between 18 and 20 weeks of the pregnancy. BODY CHANGES Your body goes through many changes during pregnancy. The changes vary from woman to woman.  Your weight will continue to increase. You will notice your lower abdomen bulging out. You may begin to get stretch marks on your hips, abdomen, and breasts. You may  develop headaches that can be relieved by medicines approved by your health care provider. You may urinate more often because the fetus is pressing on your bladder. You may develop or continue to have heartburn as a result of your pregnancy. You may develop constipation because certain hormones are causing the muscles that push waste through your intestines to slow down. You may develop hemorrhoids or swollen, bulging veins (varicose veins). You may have back pain because of the weight gain and pregnancy hormones relaxing your joints between the bones in your pelvis and as a result of a shift in weight and the muscles that support your balance. Your breasts will continue to grow and be tender. Your gums may bleed and may be sensitive to brushing and flossing. Dark spots or blotches (chloasma, mask of pregnancy) may develop on your face. This will likely fade after the baby is born. A dark line from your belly button to the pubic area (linea nigra) may appear. This will likely fade after the  baby is born. You may have changes in your hair. These can include thickening of your hair, rapid growth, and changes in texture. Some women also have hair loss during or after pregnancy, or hair that feels dry or thin. Your hair will most likely return to normal after your baby is born. WHAT TO EXPECT AT YOUR PRENATAL VISITS During a routine prenatal visit: You will be weighed to make sure you and the fetus are growing normally. Your blood pressure will be taken. Your abdomen will be measured to track your baby's growth. The fetal heartbeat will be listened to. Any test results from the previous visit will be discussed. Your health care provider may ask you: How you are feeling. If you are feeling the baby move. If you have had any abnormal symptoms, such as leaking fluid, bleeding, severe headaches, or abdominal cramping. If you have any questions. Other tests that may be performed during your second  trimester include: Blood tests that check for: Low iron levels (anemia). Gestational diabetes (between 24 and 28 weeks). Rh antibodies. Urine tests to check for infections, diabetes, or protein in the urine. An ultrasound to confirm the proper growth and development of the baby. An amniocentesis to check for possible genetic problems. Fetal screens for spina bifida and Down syndrome. HOME CARE INSTRUCTIONS  Avoid all smoking, herbs, alcohol, and unprescribed drugs. These chemicals affect the formation and growth of the baby. Follow your health care provider's instructions regarding medicine use. There are medicines that are either safe or unsafe to take during pregnancy. Exercise only as directed by your health care provider. Experiencing uterine cramps is a good sign to stop exercising. Continue to eat regular, healthy meals. Wear a good support bra for breast tenderness. Do not use hot tubs, steam rooms, or saunas. Wear your seat belt at all times when driving. Avoid raw meat, uncooked cheese, cat litter boxes, and soil used by cats. These carry germs that can cause birth defects in the baby. Take your prenatal vitamins. Try taking a stool softener (if your health care provider approves) if you develop constipation. Eat more high-fiber foods, such as fresh vegetables or fruit and whole grains. Drink plenty of fluids to keep your urine clear or pale yellow. Take warm sitz baths to soothe any pain or discomfort caused by hemorrhoids. Use hemorrhoid cream if your health care provider approves. If you develop varicose veins, wear support hose. Elevate your feet for 15 minutes, 3-4 times a day. Limit salt in your diet. Avoid heavy lifting, wear low heel shoes, and practice good posture. Rest with your legs elevated if you have leg cramps or low back pain. Visit your dentist if you have not gone yet during your pregnancy. Use a soft toothbrush to brush your teeth and be gentle when you floss. A  sexual relationship may be continued unless your health care provider directs you otherwise. Continue to go to all your prenatal visits as directed by your health care provider. SEEK MEDICAL CARE IF:  You have dizziness. You have mild pelvic cramps, pelvic pressure, or nagging pain in the abdominal area. You have persistent nausea, vomiting, or diarrhea. You have a bad smelling vaginal discharge. You have pain with urination. SEEK IMMEDIATE MEDICAL CARE IF:  You have a fever. You are leaking fluid from your vagina. You have spotting or bleeding from your vagina. You have severe abdominal cramping or pain. You have rapid weight gain or loss. You have shortness of breath with chest pain. You  notice sudden or extreme swelling of your face, hands, ankles, feet, or legs. You have not felt your baby move in over an hour. You have severe headaches that do not go away with medicine. You have vision changes. Document Released: 06/22/2001 Document Revised: 07/03/2013 Document Reviewed: 08/29/2012 [facility] Patient Information 2015 [place], [place]. This information is not intended to replace advice given to you by your health care provider. Make sure you discuss any questions you have with your health care provider.

## 2023-09-30 ENCOUNTER — Telehealth: Admitting: Family Medicine

## 2023-09-30 ENCOUNTER — Encounter: Payer: Self-pay | Admitting: Women's Health

## 2023-09-30 ENCOUNTER — Telehealth: Admitting: Physician Assistant

## 2023-09-30 DIAGNOSIS — Z3490 Encounter for supervision of normal pregnancy, unspecified, unspecified trimester: Secondary | ICD-10-CM

## 2023-09-30 DIAGNOSIS — Z3A24 24 weeks gestation of pregnancy: Secondary | ICD-10-CM

## 2023-09-30 DIAGNOSIS — N390 Urinary tract infection, site not specified: Secondary | ICD-10-CM

## 2023-09-30 DIAGNOSIS — R300 Dysuria: Secondary | ICD-10-CM

## 2023-09-30 NOTE — Progress Notes (Signed)
 Pt is pregnant with UTI sx of burning, frequency, and urgency. She had a UTI a couple of weeks ago and took keflex without relief. She had pyelonephritis her last pregnancy. She is advised to go to urgent care. [facility]  For the safety of you and your child, I recommend a face to face office visit with a health care provider.  Many mothers need to take medicines during their pregnancy and while nursing.  Almost all medicines pass into the breast milk in small quantities.  Most are generally considered safe for a mother to take but some medicines must be avoided.  After reviewing your E-Visit request, I recommend that you consult your OB/GYN or pediatrician for medical advice in relation to your condition and prescription medications while pregnant or breastfeeding.  NOTE:  There will be NO CHARGE for this eVisit  If you are having a true medical emergency please call 911.    For an urgent face to face visit, [facility] has six urgent care centers for your convenience:     [facility] Health Urgent Care Center at [facility] Directions [phone] [address] [place], [place] [address]    [facility] Health Urgent Care Center [facility]) Get Driving Directions [phone] [address] [place], [place] [address]  [facility] Health Urgent Care Center [facility]) Get Driving Directions [phone] [address] [place],  [place]  [address]  [facility] Health Urgent Care at [facility] Get Driving Directions [phone] [address], [address] [place], [place] [address]   [facility] Health Urgent Care at [facility] Get Driving Directions  [phone] [address] [place], [place] [address]   [facility] Health Urgent Care at [facility] Directions [phone] [address] [place], [place] [address]  Your MyChart E-visit questionnaire answers were reviewed by a board certified advanced clinical practitioner  to complete your personal care plan based on your specific symptoms.  Thank you for using e-Visits.

## 2023-09-30 NOTE — Progress Notes (Signed)
 Being that you have failed a first-line treatment and have had recurrent symptoms quickly, you should be seen in person to have a urine culture obtained.  For the safety of you and your child, I recommend a face to face office visit with a health care provider.  NOTE:  There will be NO CHARGE for this eVisit  If you are having a true medical emergency please call 911.    For an urgent face to face visit, [place] has six urgent care centers for your convenience:     [facility] at [facility] Directions [phone] [address] [place], [place] [address]    [facility]) Get Driving Directions [phone] [address] [place], [place] [address]  [facility]) Get Driving Directions [phone] [address] [place],  [place]  [address]  [facility] Care at [facility] Get Driving Directions [phone] [address] [place], [place] [address]   [facility] at [facility] Get Driving Directions  [phone] [address] [place], [place] [address]   [facility] at [facility] Directions [phone] [address] [place], [place] [address]  Your MyChart E-visit questionnaire answers were reviewed by a board certified advanced clinical practitioner to complete your personal care plan based on your specific symptoms.  Thank you for using e-Visits.     I have spent 5 minutes in review of e-visit questionnaire, review and updating patient chart, medical decision making and response to patient.   Margaretann Loveless, PA-C

## 2023-10-02 ENCOUNTER — Other Ambulatory Visit: Payer: Self-pay

## 2023-10-02 ENCOUNTER — Encounter (HOSPITAL_COMMUNITY): Payer: Self-pay

## 2023-10-02 ENCOUNTER — Emergency Department (HOSPITAL_COMMUNITY)
Admission: EM | Admit: 2023-10-02 | Discharge: 2023-10-02 | Disposition: A | Discharge status: Home / Self Care | Attending: Emergency Medicine | Admitting: Emergency Medicine

## 2023-10-02 DIAGNOSIS — N3001 Acute cystitis with hematuria: Secondary | ICD-10-CM | POA: Diagnosis not present

## 2023-10-02 DIAGNOSIS — O2312 Infections of bladder in pregnancy, second trimester: Secondary | ICD-10-CM | POA: Diagnosis not present

## 2023-10-02 DIAGNOSIS — R300 Dysuria: Secondary | ICD-10-CM | POA: Diagnosis present

## 2023-10-02 DIAGNOSIS — Z7982 Long term (current) use of aspirin: Secondary | ICD-10-CM | POA: Insufficient documentation

## 2023-10-02 LAB — URINALYSIS, W/ REFLEX TO CULTURE (INFECTION SUSPECTED)
Bilirubin Urine: NEGATIVE
Glucose, UA: NEGATIVE mg/dL
Hgb urine dipstick: NEGATIVE
Ketones, ur: NEGATIVE mg/dL
Nitrite: NEGATIVE
Protein, ur: 30 mg/dL — AB
Specific Gravity, Urine: 1.029 (ref 1.005–1.030)
pH: 6 (ref 5.0–8.0)

## 2023-10-02 MED ORDER — CEFPODOXIME PROXETIL 200 MG PO TABS
200.0000 mg | ORAL_TABLET | Freq: Two times a day (BID) | ORAL | 0 refills | Status: AC
Start: 2023-10-02 — End: 2023-10-09

## 2023-10-02 MED ORDER — CEFUROXIME AXETIL 250 MG PO TABS
500.0000 mg | ORAL_TABLET | Freq: Two times a day (BID) | ORAL | Status: DC
  Administered 2023-10-02: 500 mg via ORAL
  Filled 2023-10-02: qty 2

## 2023-10-02 NOTE — ED Triage Notes (Signed)
Burning with urination and frequency since Friday.

## 2023-10-02 NOTE — Discharge Instructions (Signed)
 You are seen in the emergency room for UTI today.  Your urinalysis does confirm likely UTI.  We are going to treat you with cefpodoxime as you are on

## 2023-10-02 NOTE — ED Provider Notes (Addendum)
 [place] EM[facility]GENC[facility] DE[facility]MENT AT [facility] Provider Note   CSN: [id number] Arrival date & time: 10/02/23  1550     History  Chief Complaint  Patient presents with   Dysuria    [name] is a 33 y.o. female.  She is 24 weeks 2 days pregnant, has had gestational hypertension with prior pregnancy but no complications currently this pregnancy, no vaginal bleeding or fluid leakage, normal fetal movement.  Here today for evaluation of dysuria, frequency and hesitancy.  Feels like prior UTIs.  Started 2 days ago, she is not able to get into her OB to provide sample and provider on virtual visit felt she needed in person visit as she had treatment for UTI in February with Keflex and symptoms returned already, they wanted her to be able to provide a urine culture   Dysuria      Home Medications Prior to Admission medications   Medication Sig Start Date End Date Taking? Authorizing Provider  cefpodoxime (VANTIN) 200 MG tablet Take 1 tablet (200 mg total) by mouth 2 (two) times daily for 7 days. 10/02/23 10/09/23 Yes Morayo Leven A, PA-C  acetaminophen (TYLENOL) 325 MG tablet Take 2 tablets (650 mg total) by mouth every 4 (four) hours as needed (for pain scale < 4). 09/25/21   Cresenzo-Dishmon, Scarlette Calico, CNM  aspirin EC 81 MG tablet Take 2 tablets (162 mg total) by mouth daily. Swallow whole. 07/14/23   Cheral Marker, CNM  Blood Pressure Monitor MISC For regular home bp monitoring during pregnancy 08/11/23   Cheral Marker, CNM  cephALEXin (KEFLEX) 500 MG capsule Take 1 capsule (500 mg total) by mouth 2 (two) times daily. Patient not taking: Reported on 09/28/2023 09/01/23   Margaretann Loveless, PA-C  doxylamine, Sleep, (UNISOM) 25 MG tablet Take 12.5 mg by mouth at bedtime as needed.    [provider]  OVER THE COUNTER MEDICATION Prenatal gummies    [provider]  pyridoxine (B-6) 100 MG tablet Take 100 mg by mouth daily.    [provider]      Allergies    Patient has no known allergies.    Review of Systems   Review of Systems  Genitourinary:  Positive for dysuria.    Physical Exam Updated Vital Signs BP 118/81 (BP Location: Right Arm)   Pulse 88   Temp 98.4 F (36.9 C) (Oral)   Resp 16   Ht 5\' 6"  (1.676 m)   Wt 81.6 kg   LMP 04/15/2023 (Exact Date) Comment: Shorter and lighter than normal  SpO2 100%   BMI 29.05 kg/m  Physical Exam Vitals and nursing note reviewed.  Constitutional:      General: She is not in acute distress.    Appearance: She is well-developed.  HENT:     Head: Normocephalic and atraumatic.  Eyes:     Conjunctiva/sclera: Conjunctivae normal.  Cardiovascular:     Rate and Rhythm: Normal rate and regular rhythm.     Heart sounds: No murmur heard. Pulmonary:     Effort: Pulmonary effort is normal. No respiratory distress.     Breath sounds: Normal breath sounds.  Abdominal:     Palpations: Abdomen is soft.     Tenderness: There is no abdominal tenderness. There is no right CVA tenderness or left CVA tenderness.  Musculoskeletal:        General: No swelling.     Cervical back: Neck supple.  Skin:    General: Skin  is warm and dry.     Capillary Refill: Capillary refill takes less than 2 seconds.  Neurological:     General: No focal deficit present.     Mental Status: She is alert and oriented to person, place, and time.  Psychiatric:        Mood and Affect: Mood normal.     ED Results / Procedures / Treatments   Labs (all labs ordered are listed, but only abnormal results are displayed) Labs Reviewed  URINALYSIS, W/ REFLEX TO CULTURE (INFECTION SUSPECTED) - Abnormal; Notable for the following components:      Result Value   APPearance HAZY (*)    Protein, ur 30 (*)    Leukocytes,Ua SMALL (*)    Bacteria, UA RARE (*)    All other components within normal limits  URINE CULTURE    EKG None  Radiology No results found.  Procedures Procedures     Medications Ordered in ED Medications  cefUROXime (CEFTIN) tablet 500 mg (has no administration in time range)    ED Course/ Medical Decision Making/ A&P                                 Medical Decision Making Differential diagnosis includes but not limited to: UTI, pyelonephritis, ureterolithiasis, bacterial vaginosis, other  Course: Patient is a 24-week pregnant female, currently pregnancy uncomplicated, complaining of several days of dysuria, frequency, urgency and urinary hesitancy.  She has had similar symptoms with prior UTIs in the past.  She was treated for UTI at the beginning of February with similar symptoms on telephone visit.  She was unable to get in to give a sample on Friday at her OB/GYN because they closed early and provider at her PCP on ED visit thought she needed an in person visit since symptoms came back after recent treatment.  Reviewed patient's prior records, she most recently been prescribed a course of Keflex twice daily which she did complete.  Discussed with patient typically for symptomatic UTI in pregnancy treatment would have been 4 times a day.  Urinalysis today shows small leukocytes, 6-10 red blood cells, 21-50 white blood cells and rare bacteria with no squamous epithelial cells.  Given patient's symptoms we will treat for UTI, and urine was sent for culture as well.  Since she had recent antibiotics will give cefpodoxime.  Discussed with patient if she is not able to fill this due to cost we could probably trial Keflex at the appropriate dosing.  She is going to follow-up with her OB/GYN pending culture results.  She has got no flank pain or fevers and has normal vitals.  I do not feel she needs labs at this time or further workup, she is advised on follow-up and strict return precautions.  Fetal heart tones WNL in the ED.  Amount and/or Complexity of Data Reviewed Labs: ordered.  Risk Prescription drug management.           Final Clinical  Impression(s) / ED Diagnoses Final diagnoses:  Acute cystitis with hematuria    Rx / DC Orders ED Discharge Orders          Ordered    cefpodoxime (VANTIN) 200 MG tablet  2 times daily        10/02/23 1636              Ma Rings, PA-C 10/02/23 [address], [place] 10/02/23 1654  Jacalyn Lefevre, MD 10/02/23 [phone]

## 2023-10-03 ENCOUNTER — Telehealth (HOSPITAL_COMMUNITY): Payer: Self-pay | Admitting: Physician Assistant

## 2023-10-03 MED ORDER — CEPHALEXIN 500 MG PO CAPS: 500.0000 mg | ORAL_CAPSULE | Freq: Four times a day (QID) | ORAL | 0 refills | Status: AC

## 2023-10-03 NOTE — Telephone Encounter (Cosign Needed)
 Called stating the cefpodoxime was too expensive, not covered by her insurance.  She did recently have a course of Keflex several months ago for UTI but was at a lower dose only oh twice daily.  Discussed with patient we will do 4 times daily Keflex x 7 days and have her follow-up with OB regarding culture results.  We discussed this is not ideal due to possible resistance from recent subtherapeutic dosing but given insurance will start with this.  She had no fever or flank pain to suggest pyelonephritis.

## 2023-10-26 ENCOUNTER — Ambulatory Visit: Admitting: Women's Health

## 2023-10-26 ENCOUNTER — Other Ambulatory Visit

## 2023-10-26 ENCOUNTER — Encounter: Payer: Self-pay | Admitting: *Deleted

## 2023-10-26 ENCOUNTER — Encounter: Payer: Self-pay | Admitting: Women's Health

## 2023-10-26 VITALS — BP 118/82 | HR 103 | Wt 191.5 lb

## 2023-10-26 DIAGNOSIS — Z3480 Encounter for supervision of other normal pregnancy, unspecified trimester: Secondary | ICD-10-CM | POA: Diagnosis not present

## 2023-10-26 DIAGNOSIS — Z131 Encounter for screening for diabetes mellitus: Secondary | ICD-10-CM

## 2023-10-26 DIAGNOSIS — Z1332 Encounter for screening for maternal depression: Secondary | ICD-10-CM | POA: Diagnosis not present

## 2023-10-26 DIAGNOSIS — Z3A27 27 weeks gestation of pregnancy: Secondary | ICD-10-CM | POA: Diagnosis not present

## 2023-10-26 DIAGNOSIS — Z3482 Encounter for supervision of other normal pregnancy, second trimester: Secondary | ICD-10-CM

## 2023-10-26 NOTE — Patient Instructions (Signed)
[place], thank you for choosing our office today! We appreciate the opportunity to meet your healthcare needs. You may receive a short survey by mail, e-mail, or through MyChart. If you are happy with your care we would appreciate if you could take just a few minutes to complete the survey questions. We read all of your comments and take your feedback very seriously. Thank you again for choosing our office.  [facility] [place] ([address] [place], [place] [address]) Entrance C, located off of [address] Free 24/7 valet parking   CLASSES: Go to [email or web address] to register for classes (childbirth, breastfeeding, waterbirth, infant CPR, daddy bootcamp, etc.)  Call the office ([phone]) or go to [facility] if: You begin to have strong, frequent contractions Your water breaks.  Sometimes it is a big gush of fluid, sometimes it is just a trickle that keeps getting your panties wet or running down your legs You have vaginal bleeding.  It is normal to have a small amount of spotting if your cervix was checked.  You don't feel your baby moving like normal.  If you don't, get you something to eat and drink and lay down and focus on feeling your baby move.   If your baby is still not moving like normal, you should call the office or go to [facility].  Call the office ([phone]) or go to [facility] for these signs of pre-eclampsia: Severe headache that does not go away with Tylenol Visual changes- seeing spots, double, blurred vision Pain under your right breast or upper abdomen that does not go away with Tums or heartburn medicine Nausea and/or vomiting Severe swelling in your hands, feet, and face   Tdap Vaccine It is recommended that you get the Tdap vaccine during the third trimester of EACH pregnancy to help protect your baby from getting pertussis (whooping cough) 27-36 weeks is the BEST time to do  this so that you can pass the protection on to your baby. During pregnancy is better than after pregnancy, but if you are unable to get it during pregnancy it will be offered at the hospital.  You can get this vaccine with us, at the health department, your family doctor, or some local pharmacies Everyone who will be around your baby should also be up-to-date on their vaccines before the baby comes. Adults (who are not pregnant) only need 1 dose of Tdap during adulthood.   [place] Pediatricians/Family Doctors [facility] ([facility]): [address], [phone]           [facility]: [address], [phone]                [facility] ([facility]): [address], [phone] (call to ask if accepting patients) [facility]: [address], [place], [phone]    [place] Pediatricians/Family Doctors [facility] ([facility]): [address], [phone] [facility]: [address], [phone] [facility]: [address], [phone]  [place] [facility]  [facility] ([facility]): [phone] [facility]: [address], [phone]   [place] Family Doctors [facility]: [address], [phone]  [place] [facility]  [facility]: [address], [phone]  Home Blood Pressure Monitoring for Patients   Your provider has recommended that you check your   blood pressure (BP) at least once a week at home. If you do not have a blood pressure cuff at home, one will be provided for you. Contact your provider if you have not received your monitor within 1 week.   Helpful Tips for Accurate Home Blood Pressure Checks  Don't smoke, exercise, or drink caffeine 30 minutes before checking your BP Use the restroom before checking your BP (a full bladder can raise your  pressure) Relax in a comfortable upright chair Feet on the ground Left arm resting comfortably on a flat surface at the level of your heart Legs uncrossed Back supported Sit quietly and don't talk Place the cuff on your bare arm Adjust snuggly, so that only two fingertips can fit between your skin and the top of the cuff Check 2 readings separated by at least one minute Keep a log of your BP readings For a visual, please reference this diagram: http://ccnc.care/bpdiagram  Provider Name: [facility]     Phone: [phone]  Zone 1: ALL CLEAR  Continue to monitor your symptoms:  BP reading is less than 140 (top number) or less than 90 (bottom number)  No right upper stomach pain No headaches or seeing spots No feeling nauseated or throwing up No swelling in face and hands  Zone 2: CAUTION Call your doctor's office for any of the following:  BP reading is greater than 140 (top number) or greater than 90 (bottom number)  Stomach pain under your ribs in the middle or right side Headaches or seeing spots Feeling nauseated or throwing up Swelling in face and hands  Zone 3: EMERGENCY  Seek immediate medical care if you have any of the following:  BP reading is greater than160 (top number) or greater than 110 (bottom number) Severe headaches not improving with Tylenol Serious difficulty catching your breath Any worsening symptoms from Zone 2   Third Trimester of Pregnancy The third trimester is from week 29 through week 42, months 7 through 9. The third trimester is a time when the fetus is growing rapidly. At the end of the ninth month, the fetus is about 20 inches in length and weighs 6-10 pounds.  BODY CHANGES Your body goes through many changes during pregnancy. The changes vary from woman to woman.  Your weight will continue to increase. You can expect to gain 25-35 pounds (11-16 kg) by the end of the pregnancy. You may begin to get stretch marks on your hips, abdomen,  and breasts. You may urinate more often because the fetus is moving lower into your pelvis and pressing on your bladder. You may develop or continue to have heartburn as a result of your pregnancy. You may develop constipation because certain hormones are causing the muscles that push waste through your intestines to slow down. You may develop hemorrhoids or swollen, bulging veins (varicose veins). You may have pelvic pain because of the weight gain and pregnancy hormones relaxing your joints between the bones in your pelvis. Backaches may result from overexertion of the muscles supporting your posture. You may have changes in your hair. These can include thickening of your hair, rapid growth, and changes in texture. Some women also have hair loss during or after pregnancy, or hair that feels dry or thin. Your hair will most likely return to normal after your baby is born. Your breasts will continue to grow and be tender. A yellow discharge may leak from your breasts called colostrum. Your belly button may stick out. You may   feel short of breath because of your expanding uterus. You may notice the fetus "dropping," or moving lower in your abdomen. You may have a bloody mucus discharge. This usually occurs a few days to a week before labor begins. Your cervix becomes thin and soft (effaced) near your due date. WHAT TO EXPECT AT YOUR PRENATAL EXAMS  You will have prenatal exams every 2 weeks until week 36. Then, you will have weekly prenatal exams. During a routine prenatal visit: You will be weighed to make sure you and the fetus are growing normally. Your blood pressure is taken. Your abdomen will be measured to track your baby's growth. The fetal heartbeat will be listened to. Any test results from the previous visit will be discussed. You may have a cervical check near your due date to see if you have effaced. At around 36 weeks, your caregiver will check your cervix. At the same time, your  caregiver will also perform a test on the secretions of the vaginal tissue. This test is to determine if a type of bacteria, Group B streptococcus, is present. Your caregiver will explain this further. Your caregiver may ask you: What your birth plan is. How you are feeling. If you are feeling the baby move. If you have had any abnormal symptoms, such as leaking fluid, bleeding, severe headaches, or abdominal cramping. If you have any questions. Other tests or screenings that may be performed during your third trimester include: Blood tests that check for low iron levels (anemia). Fetal testing to check the health, activity level, and growth of the fetus. Testing is done if you have certain medical conditions or if there are problems during the pregnancy. FALSE LABOR You may feel small, irregular contractions that eventually go away. These are called Braxton Hicks contractions, or false labor. Contractions may last for hours, days, or even weeks before true labor sets in. If contractions come at regular intervals, intensify, or become painful, it is best to be seen by your caregiver.  SIGNS OF LABOR  Menstrual-like cramps. Contractions that are 5 minutes apart or less. Contractions that start on the top of the uterus and spread down to the lower abdomen and back. A sense of increased pelvic pressure or back pain. A watery or bloody mucus discharge that comes from the vagina. If you have any of these signs before the 37th week of pregnancy, call your caregiver right away. You need to go to the hospital to get checked immediately. HOME CARE INSTRUCTIONS  Avoid all smoking, herbs, alcohol, and unprescribed drugs. These chemicals affect the formation and growth of the baby. Follow your caregiver's instructions regarding medicine use. There are medicines that are either safe or unsafe to take during pregnancy. Exercise only as directed by your caregiver. Experiencing uterine cramps is a good sign to  stop exercising. Continue to eat regular, healthy meals. Wear a good support bra for breast tenderness. Do not use hot tubs, steam rooms, or saunas. Wear your seat belt at all times when driving. Avoid raw meat, uncooked cheese, cat litter boxes, and soil used by cats. These carry germs that can cause birth defects in the baby. Take your prenatal vitamins. Try taking a stool softener (if your caregiver approves) if you develop constipation. Eat more high-fiber foods, such as fresh vegetables or fruit and whole grains. Drink plenty of fluids to keep your urine clear or pale yellow. Take warm sitz baths to soothe any pain or discomfort caused by hemorrhoids. Use hemorrhoid cream if   your caregiver approves. If you develop varicose veins, wear support hose. Elevate your feet for 15 minutes, 3-4 times a day. Limit salt in your diet. Avoid heavy lifting, wear low heal shoes, and practice good posture. Rest a lot with your legs elevated if you have leg cramps or low back pain. Visit your dentist if you have not gone during your pregnancy. Use a soft toothbrush to brush your teeth and be gentle when you floss. A sexual relationship may be continued unless your caregiver directs you otherwise. Do not travel far distances unless it is absolutely necessary and only with the approval of your caregiver. Take prenatal classes to understand, practice, and ask questions about the labor and delivery. Make a trial run to the hospital. Pack your hospital bag. Prepare the baby's nursery. Continue to go to all your prenatal visits as directed by your caregiver. SEEK MEDICAL CARE IF: You are unsure if you are in labor or if your water has broken. You have dizziness. You have mild pelvic cramps, pelvic pressure, or nagging pain in your abdominal area. You have persistent nausea, vomiting, or diarrhea. You have a bad smelling vaginal discharge. You have pain with urination. SEEK IMMEDIATE MEDICAL CARE IF:  You  have a fever. You are leaking fluid from your vagina. You have spotting or bleeding from your vagina. You have severe abdominal cramping or pain. You have rapid weight loss or gain. You have shortness of breath with chest pain. You notice sudden or extreme swelling of your face, hands, ankles, feet, or legs. You have not felt your baby move in over an hour. You have severe headaches that do not go away with medicine. You have vision changes. Document Released: 06/22/2001 Document Revised: 07/03/2013 Document Reviewed: 08/29/2012 Exit[employer] Patient Information 2015 [employer]. This information is not intended to replace advice given to you by your health care provider. Make sure you discuss any questions you have with your health care provider.       

## 2023-10-26 NOTE — Progress Notes (Signed)
 LOW-RISK PREGNANCY VISIT Patient name: [name] MRN [id number]  Date of birth: 08/16/1990 Chief Complaint:   Routine Prenatal Visit (PN2 today!!!)  History of Present Illness:   [name] is a 33 y.o. [phone] female at [redacted]w[redacted]d with an Estimated Date of Delivery: 01/20/24 being seen today for ongoing management of a low-risk pregnancy.   Today she reports swelling. Had 1 SBP in 140s, rechecked and was normal.  Contractions: Irritability. Vag. Bleeding: None.  Movement: Present. denies leaking of fluid.     10/26/2023    9:03 AM 07/14/2021    8:36 AM 04/06/2021    2:13 PM  Depression screen PHQ 2/9  Decreased Interest 0 0 0  Down, Depressed, Hopeless 0 0 0  PHQ - 2 Score 0 0 0  Altered sleeping 0 0 0  Tired, decreased energy 0 0 0  Change in appetite 0 0 0  Feeling bad or failure about yourself  0 0 0  Trouble concentrating 0 0 0  Moving slowly or fidgety/restless 0 0 0  Suicidal thoughts 0 0 0  PHQ-9 Score 0 0 0        10/26/2023    9:04 AM 07/14/2021    8:36 AM 04/06/2021    2:13 PM  GAD 7 : Generalized Anxiety Score  Nervous, Anxious, on Edge 0 0 0  Control/stop worrying 0 0 0  Worry too much - different things 0 0 0  Trouble relaxing 0 0 0  Restless 0 0 0  Easily annoyed or irritable 0 0 0  Afraid - awful might happen 0 0 0  Total GAD 7 Score 0 0 0      Review of Systems:   Pertinent items are noted in HPI Denies abnormal vaginal discharge w/ itching/odor/irritation, headaches, visual changes, shortness of breath, chest pain, abdominal pain, severe nausea/vomiting, or problems with urination or bowel movements unless otherwise stated above. Pertinent History Reviewed:  Reviewed past medical,surgical, social, obstetrical and family history.  Reviewed problem list, medications and allergies. Physical Assessment:   Vitals:   10/26/23 0900  BP: 118/82  Pulse: (!) 103  Weight: 191 lb 8 oz (86.9 kg)  Body mass index is 30.91 kg/m.        Physical  Examination:   General appearance: Well appearing, and in no distress  Mental status: Alert, oriented to person, place, and time  Skin: Warm & dry  Cardiovascular: Normal heart rate noted  Respiratory: Normal respiratory effort, no distress  Abdomen: Soft, gravid, nontender  Pelvic: Cervical exam deferred         Extremities: Edema: Trace  Fetal Status: Fetal Heart Rate (bpm): 145 Fundal Height: 28 cm Movement: Present    Chaperone: N/A No results found for this or any previous visit (from the past 24 hours).  Assessment & Plan:  1) Low-risk pregnancy [id number] at [redacted]w[redacted]d with an Estimated Date of Delivery: 01/20/24   2) H/O gHTN, check home bp's weekly, if >140/90 or pre-e s/s, let us know. Continue ASA   Meds: No orders of the defined types were placed in this encounter.  Labs/procedures today: PN2 and declined Tdap  Plan:  Continue routine obstetrical care  Next visit: prefers in person    Reviewed: Preterm labor symptoms and general obstetric precautions including but not limited to vaginal bleeding, contractions, leaking of fluid and fetal movement were reviewed in detail with the patient.  All questions were answered. Does have home bp cuff. Office bp cuff given:  not applicable. Check bp weekly, let us  know if consistently >140 and/or >90.  Follow-up: Return in about 3 weeks (around 11/16/2023) for LROB, CNM, in person.  Future Appointments  Date Time Provider Department Center  11/16/2023  4:10 PM Ferd Householder, CNM [facility]    No orders of the defined types were placed in this encounter.  Ferd Householder CNM, [facility] 10/26/2023 9:21 AM

## 2023-10-27 ENCOUNTER — Encounter: Payer: Self-pay | Admitting: Women's Health

## 2023-10-27 LAB — CBC
Hematocrit: 33 % — ABNORMAL LOW (ref 34.0–46.6)
Hemoglobin: 11.2 g/dL (ref 11.1–15.9)
MCH: 31.5 pg (ref 26.6–33.0)
MCHC: 33.9 g/dL (ref 31.5–35.7)
MCV: 93 fL (ref 79–97)
Platelets: 178 10*3/uL (ref 150–450)
RBC: 3.55 x10E6/uL — ABNORMAL LOW (ref 3.77–5.28)
RDW: 12.7 % (ref 11.7–15.4)
WBC: 9.3 10*3/uL (ref 3.4–10.8)

## 2023-10-27 LAB — GLUCOSE TOLERANCE, 2 HOURS W/ 1HR
Glucose, 1 hour: 152 mg/dL (ref 70–179)
Glucose, 2 hour: 101 mg/dL (ref 70–152)
Glucose, Fasting: 88 mg/dL (ref 70–91)

## 2023-10-27 LAB — ANTIBODY SCREEN: Antibody Screen: NEGATIVE

## 2023-10-27 LAB — RPR: RPR Ser Ql: NONREACTIVE

## 2023-10-27 LAB — HIV ANTIBODY (ROUTINE TESTING W REFLEX): HIV Screen 4th Generation wRfx: NONREACTIVE

## 2023-11-01 ENCOUNTER — Encounter: Payer: Self-pay | Admitting: Women's Health

## 2023-11-16 ENCOUNTER — Encounter: Payer: Self-pay | Admitting: Women's Health

## 2023-11-16 ENCOUNTER — Ambulatory Visit: Admitting: Women's Health

## 2023-11-16 VITALS — BP 116/67 | HR 99 | Wt 197.8 lb

## 2023-11-16 DIAGNOSIS — Z3483 Encounter for supervision of other normal pregnancy, third trimester: Secondary | ICD-10-CM | POA: Diagnosis not present

## 2023-11-16 DIAGNOSIS — Z3A30 30 weeks gestation of pregnancy: Secondary | ICD-10-CM | POA: Diagnosis not present

## 2023-11-16 NOTE — Progress Notes (Signed)
 LOW-RISK PREGNANCY VISIT Patient name: [name] MRN [id number]  Date of birth: 1991-01-17 Chief Complaint:   Routine Prenatal Visit  History of Present Illness:   [name] is a 33 y.o. [id number] female at [redacted]w[redacted]d with an Estimated Date of Delivery: 01/20/24 being seen today for ongoing management of a low-risk pregnancy.   Today she reports carpal tunnel symptoms, swelling legs . Contractions: Not present. Vag. Bleeding: None.  Movement: Present. denies leaking of fluid.     10/26/2023    9:03 AM 07/14/2021    8:36 AM 04/06/2021    2:13 PM  Depression screen PHQ 2/9  Decreased Interest 0 0 0  Down, Depressed, Hopeless 0 0 0  PHQ - 2 Score 0 0 0  Altered sleeping 0 0 0  Tired, decreased energy 0 0 0  Change in appetite 0 0 0  Feeling bad or failure about yourself  0 0 0  Trouble concentrating 0 0 0  Moving slowly or fidgety/restless 0 0 0  Suicidal thoughts 0 0 0  PHQ-9 Score 0 0 0        10/26/2023    9:04 AM 07/14/2021    8:36 AM 04/06/2021    2:13 PM  GAD 7 : Generalized Anxiety Score  Nervous, Anxious, on Edge 0 0 0  Control/stop worrying 0 0 0  Worry too much - different things 0 0 0  Trouble relaxing 0 0 0  Restless 0 0 0  Easily annoyed or irritable 0 0 0  Afraid - awful might happen 0 0 0  Total GAD 7 Score 0 0 0      Review of Systems:   Pertinent items are noted in HPI Denies abnormal vaginal discharge w/ itching/odor/irritation, headaches, visual changes, shortness of breath, chest pain, abdominal pain, severe nausea/vomiting, or problems with urination or bowel movements unless otherwise stated above. Pertinent History Reviewed:  Reviewed past medical,surgical, social, obstetrical and family history.  Reviewed problem list, medications and allergies. Physical Assessment:   Vitals:   11/16/23 1617  BP: 116/67  Pulse: 99  Weight: 197 lb 12.8 oz (89.7 kg)  Body mass index is 31.93 kg/m.        Physical Examination:   General appearance:  Well appearing, and in no distress  Mental status: Alert, oriented to person, place, and time  Skin: Warm & dry  Cardiovascular: Normal heart rate noted  Respiratory: Normal respiratory effort, no distress  Abdomen: Soft, gravid, nontender  Pelvic: Cervical exam deferred         Extremities: Edema: Mild pitting, slight indentation  Fetal Status: Fetal Heart Rate (bpm): 137 Fundal Height: 32 cm Movement: Present    Chaperone: N/A No results found for this or any previous visit (from the past 24 hours).  Assessment & Plan:  1) Low-risk pregnancy [id number] at [redacted]w[redacted]d with an Estimated Date of Delivery: 01/20/24   2) CTS, can try wrist splints  3) Swelling> compression stockings, elevate  4) H/O GHTN> bp good, check weekly, if >140/90 or pre-e s/s, let us  know   Meds: No orders of the defined types were placed in this encounter.  Labs/procedures today: none  Plan:  Continue routine obstetrical care  Next visit: prefers in person    Reviewed: Preterm labor symptoms and general obstetric precautions including but not limited to vaginal bleeding, contractions, leaking of fluid and fetal movement were reviewed in detail with the patient.  All questions were answered. Does have home bp cuff.  Office bp cuff given: not applicable. Check bp weekly, let us  know if consistently >140 and/or >90.  Follow-up: Return in about 2 weeks (around 11/30/2023) for LROB, CNM, in person.  Future Appointments  Date Time Provider Department Center  11/30/2023  3:10 PM Ferd Householder, CNM [facility]    No orders of the defined types were placed in this encounter.  Ferd Householder CNM, [facility] 11/16/2023 4:48 PM

## 2023-11-16 NOTE — Patient Instructions (Signed)
[place], thank you for choosing our office today! We appreciate the opportunity to meet your healthcare needs. You may receive a short survey by mail, e-mail, or through MyChart. If you are happy with your care we would appreciate if you could take just a few minutes to complete the survey questions. We read all of your comments and take your feedback very seriously. Thank you again for choosing our office.  [facility]  [facility] ([address] [place], [place] [address]) Entrance C, located off of [address] Free 24/7 valet parking   CLASSES: Go to [email or web address] to register for classes (childbirth, breastfeeding, waterbirth, infant CPR, daddy bootcamp, etc.)  Call the office ([phone]) or go to [facility] if: You begin to have strong, frequent contractions Your water breaks.  Sometimes it is a big gush of fluid, sometimes it is just a trickle that keeps getting your panties wet or running down your legs You have vaginal bleeding.  It is normal to have a small amount of spotting if your cervix was checked.  You don't feel your baby moving like normal.  If you don't, get you something to eat and drink and lay down and focus on feeling your baby move.   If your baby is still not moving like normal, you should call the office or go to [facility].  Call the office ([phone]) or go to [facility] for these signs of pre-eclampsia: Severe headache that does not go away with Tylenol Visual changes- seeing spots, double, blurred vision Pain under your right breast or upper abdomen that does not go away with Tums or heartburn medicine Nausea and/or vomiting Severe swelling in your hands, feet, and face   Tdap Vaccine It is recommended that you get the Tdap vaccine during the third trimester of EACH pregnancy to help protect your baby from getting pertussis (whooping cough) 27-36 weeks is the BEST time to do  this so that you can pass the protection on to your baby. During pregnancy is better than after pregnancy, but if you are unable to get it during pregnancy it will be offered at the hospital.  You can get this vaccine with us, at the health department, your family doctor, or some local pharmacies Everyone who will be around your baby should also be up-to-date on their vaccines before the baby comes. Adults (who are not pregnant) only need 1 dose of Tdap during adulthood.   [place] Pediatricians/Family Doctors [facility] ([facility]): [address], [phone]           [facility]: [address], [phone]                [facility] ([facility]): [address], [phone] (call to ask if accepting patients) [facility]: [address], [place], [phone]    [place] Pediatricians/Family Doctors [facility] ([facility]): [address], [phone] [facility]: [address], [phone] [facility]: [address], [phone]  [place] [facility]  [facility] ([facility]): [phone] [facility]: [address], [phone]   [place] Family Doctors [facility]: [address], [phone]  [place] [facility]  [facility]: [address], [phone]  Home Blood Pressure Monitoring for Patients   Your provider has recommended that you check your   blood pressure (BP) at least once a week at home. If you do not have a blood pressure cuff at home, one will be provided for you. Contact your provider if you have not received your monitor within 1 week.   Helpful Tips for Accurate Home Blood Pressure Checks  Don't smoke, exercise, or drink caffeine 30 minutes before checking your BP Use the restroom before checking your BP (a full bladder can raise your  pressure) Relax in a comfortable upright chair Feet on the ground Left arm resting comfortably on a flat surface at the level of your heart Legs uncrossed Back supported Sit quietly and don't talk Place the cuff on your bare arm Adjust snuggly, so that only two fingertips can fit between your skin and the top of the cuff Check 2 readings separated by at least one minute Keep a log of your BP readings For a visual, please reference this diagram: http://ccnc.care/bpdiagram  Provider Name: [facility]     Phone: [phone]  Zone 1: ALL CLEAR  Continue to monitor your symptoms:  BP reading is less than 140 (top number) or less than 90 (bottom number)  No right upper stomach pain No headaches or seeing spots No feeling nauseated or throwing up No swelling in face and hands  Zone 2: CAUTION Call your doctor's office for any of the following:  BP reading is greater than 140 (top number) or greater than 90 (bottom number)  Stomach pain under your ribs in the middle or right side Headaches or seeing spots Feeling nauseated or throwing up Swelling in face and hands  Zone 3: EMERGENCY  Seek immediate medical care if you have any of the following:  BP reading is greater than160 (top number) or greater than 110 (bottom number) Severe headaches not improving with Tylenol Serious difficulty catching your breath Any worsening symptoms from Zone 2  Preterm Labor and Birth Information  The normal length of a pregnancy is 39-41 weeks. Preterm labor is when labor starts before 37 completed weeks of pregnancy. What are the risk factors for preterm labor? Preterm labor is more likely to occur in women who: Have certain infections during pregnancy such as a bladder infection, sexually transmitted infection, or infection inside the uterus (chorioamnionitis). Have a shorter-than-normal cervix. Have gone into preterm labor before. Have had surgery on their cervix. Are younger than age 17  or older than age 35. Are African American. Are pregnant with twins or multiple babies (multiple gestation). Take street drugs or smoke while pregnant. Do not gain enough weight while pregnant. Became pregnant shortly after having been pregnant. What are the symptoms of preterm labor? Symptoms of preterm labor include: Cramps similar to those that can happen during a menstrual period. The cramps may happen with diarrhea. Pain in the abdomen or lower back. Regular uterine contractions that may feel like tightening of the abdomen. A feeling of increased pressure in the pelvis. Increased watery or bloody mucus discharge from the vagina. Water breaking (ruptured amniotic sac). Why is it important to recognize signs of preterm labor? It is important to recognize signs of preterm labor because babies who are born prematurely may not be fully developed. This can put them at an increased risk for: Long-term (chronic) heart and lung problems. Difficulty immediately after birth with regulating body systems, including blood sugar, body temperature, heart rate, and breathing rate. Bleeding in the brain. Cerebral palsy. Learning difficulties. Death. These risks are highest for babies who are born before 34 weeks   of pregnancy. How is preterm labor treated? Treatment depends on the length of your pregnancy, your condition, and the health of your baby. It may involve: Having a stitch (suture) placed in your cervix to prevent your cervix from opening too early (cerclage). Taking or being given medicines, such as: Hormone medicines. These may be given early in pregnancy to help support the pregnancy. Medicine to stop contractions. Medicines to help mature the baby's lungs. These may be prescribed if the risk of delivery is high. Medicines to prevent your baby from developing cerebral palsy. If the labor happens before 34 weeks of pregnancy, you may need to stay in the hospital. What should I do if I  think I am in preterm labor? If you think that you are going into preterm labor, call your health care provider right away. How can I prevent preterm labor in future pregnancies? To increase your chance of having a full-term pregnancy: Do not use any tobacco products, such as cigarettes, chewing tobacco, and e-cigarettes. If you need help quitting, ask your health care provider. Do not use street drugs or medicines that have not been prescribed to you during your pregnancy. Talk with your health care provider before taking any herbal supplements, even if you have been taking them regularly. Make sure you gain a healthy amount of weight during your pregnancy. Watch for infection. If you think that you might have an infection, get it checked right away. Make sure to tell your health care provider if you have gone into preterm labor before. This information is not intended to replace advice given to you by your health care provider. Make sure you discuss any questions you have with your health care provider. Document Revised: 10/20/2018 Document Reviewed: 11/19/2015 Elsevier Patient Education  2020 Elsevier Inc.   

## 2023-11-28 ENCOUNTER — Other Ambulatory Visit: Payer: Self-pay | Admitting: Women's Health

## 2023-11-28 ENCOUNTER — Encounter: Payer: Self-pay | Admitting: Women's Health

## 2023-11-28 MED ORDER — FLUTICASONE PROPIONATE 50 MCG/ACT NA SUSP: 1.0000 | Freq: Every day | NASAL | 0 refills | Status: DC

## 2023-11-30 ENCOUNTER — Encounter: Payer: Self-pay | Admitting: Women's Health

## 2023-11-30 ENCOUNTER — Ambulatory Visit: Admitting: Women's Health

## 2023-11-30 VITALS — BP 113/77 | HR 88 | Wt 197.8 lb

## 2023-11-30 DIAGNOSIS — J0100 Acute maxillary sinusitis, unspecified: Secondary | ICD-10-CM

## 2023-11-30 DIAGNOSIS — Z3480 Encounter for supervision of other normal pregnancy, unspecified trimester: Secondary | ICD-10-CM

## 2023-11-30 DIAGNOSIS — Z3A32 32 weeks gestation of pregnancy: Secondary | ICD-10-CM | POA: Diagnosis not present

## 2023-11-30 DIAGNOSIS — Z3483 Encounter for supervision of other normal pregnancy, third trimester: Secondary | ICD-10-CM | POA: Diagnosis not present

## 2023-11-30 MED ORDER — AZITHROMYCIN 250 MG PO TABS: ORAL_TABLET | ORAL | 0 refills | Status: DC

## 2023-11-30 NOTE — Progress Notes (Signed)
 LOW-RISK PREGNANCY VISIT Patient name: [name] MRN [id number]  Date of birth: 09-17-1990 Chief Complaint:   Routine Prenatal Visit  History of Present Illness:   [name] is a 33 y.o. [id number] female at [redacted]w[redacted]d with an Estimated Date of Delivery: 01/20/24 being seen today for ongoing management of a low-risk pregnancy.   Today she reports thinks she has a sinus infection, congestion/pressure x 2wks, not getting better. No fever/chills. Has been using netty pot. Contractions: Not present.  .  Movement: Present. denies leaking of fluid.     10/26/2023    9:03 AM 07/14/2021    8:36 AM 04/06/2021    2:13 PM  Depression screen PHQ 2/9  Decreased Interest 0 0 0  Down, Depressed, Hopeless 0 0 0  PHQ - 2 Score 0 0 0  Altered sleeping 0 0 0  Tired, decreased energy 0 0 0  Change in appetite 0 0 0  Feeling bad or failure about yourself  0 0 0  Trouble concentrating 0 0 0  Moving slowly or fidgety/restless 0 0 0  Suicidal thoughts 0 0 0  PHQ-9 Score 0 0 0        10/26/2023    9:04 AM 07/14/2021    8:36 AM 04/06/2021    2:13 PM  GAD 7 : Generalized Anxiety Score  Nervous, Anxious, on Edge 0 0 0  Control/stop worrying 0 0 0  Worry too much - different things 0 0 0  Trouble relaxing 0 0 0  Restless 0 0 0  Easily annoyed or irritable 0 0 0  Afraid - awful might happen 0 0 0  Total GAD 7 Score 0 0 0      Review of Systems:   Pertinent items are noted in HPI Denies abnormal vaginal discharge w/ itching/odor/irritation, headaches, visual changes, shortness of breath, chest pain, abdominal pain, severe nausea/vomiting, or problems with urination or bowel movements unless otherwise stated above. Pertinent History Reviewed:  Reviewed past medical,surgical, social, obstetrical and family history.  Reviewed problem list, medications and allergies. Physical Assessment:   Vitals:   11/30/23 1517  BP: 113/77  Pulse: 88  Weight: 197 lb 12.8 oz (89.7 kg)  Body mass index is  31.93 kg/m.        Physical Examination:   General appearance: Well appearing, and in no distress  Mental status: Alert, oriented to person, place, and time  Skin: Warm & dry             Sinuses: +tenderness maxillary sinuses bilaterally  Cardiovascular: Normal heart rate noted  Respiratory: Normal respiratory effort, no distress  Abdomen: Soft, gravid, nontender  Pelvic: Cervical exam deferred         Extremities: Edema: Trace  Fetal Status: Fetal Heart Rate (bpm): 136 Fundal Height: 32 cm Movement: Present    Chaperone: N/A No results found for this or any previous visit (from the past 24 hours).  Assessment & Plan:  1) Low-risk pregnancy [id number] at [redacted]w[redacted]d with an Estimated Date of Delivery: 01/20/24   2) Sinus infection, rx zpak  3) H/O gHTN> bp good, check weekly, if >140/90 or pre-e s/s let us  know, continue ASA   Meds:  Meds ordered this encounter  Medications   azithromycin (ZITHROMAX Z-PAK) 250 MG tablet    Sig: Use as directed    Dispense:  6 each    Refill:  0   Labs/procedures today: none  Plan:  Continue routine obstetrical care  Next  visit: prefers in person    Reviewed: Preterm labor symptoms and general obstetric precautions including but not limited to vaginal bleeding, contractions, leaking of fluid and fetal movement were reviewed in detail with the patient.  All questions were answered. Does have home bp cuff. Office bp cuff given: not applicable. Check bp weekly, let us  know if consistently >140 and/or >90.  Follow-up: Return in about 2 weeks (around 12/14/2023) for LROB, CNM, in person; 4wks from now EFW u/s and LROB w/ CNM.  Future Appointments  Date Time Provider Department Center  12/13/2023  2:50 PM Ferd Householder, CNM [facility]    No orders of the defined types were placed in this encounter.  Ferd Householder CNM, [facility] 11/30/2023 3:42 PM

## 2023-11-30 NOTE — Patient Instructions (Signed)
[place], thank you for choosing our office today! We appreciate the opportunity to meet your healthcare needs. You may receive a short survey by mail, e-mail, or through MyChart. If you are happy with your care we would appreciate if you could take just a few minutes to complete the survey questions. We read all of your comments and take your feedback very seriously. Thank you again for choosing our office.  [facility]  [facility] ([address] [place], [place] [address]) Entrance C, located off of [address] Free 24/7 valet parking   CLASSES: Go to [email or web address] to register for classes (childbirth, breastfeeding, waterbirth, infant CPR, daddy bootcamp, etc.)  Call the office ([phone]) or go to [facility] if: You begin to have strong, frequent contractions Your water breaks.  Sometimes it is a big gush of fluid, sometimes it is just a trickle that keeps getting your panties wet or running down your legs You have vaginal bleeding.  It is normal to have a small amount of spotting if your cervix was checked.  You don't feel your baby moving like normal.  If you don't, get you something to eat and drink and lay down and focus on feeling your baby move.   If your baby is still not moving like normal, you should call the office or go to [facility].  Call the office ([phone]) or go to [facility] for these signs of pre-eclampsia: Severe headache that does not go away with Tylenol Visual changes- seeing spots, double, blurred vision Pain under your right breast or upper abdomen that does not go away with Tums or heartburn medicine Nausea and/or vomiting Severe swelling in your hands, feet, and face   Tdap Vaccine It is recommended that you get the Tdap vaccine during the third trimester of EACH pregnancy to help protect your baby from getting pertussis (whooping cough) 27-36 weeks is the BEST time to do  this so that you can pass the protection on to your baby. During pregnancy is better than after pregnancy, but if you are unable to get it during pregnancy it will be offered at the hospital.  You can get this vaccine with us, at the health department, your family doctor, or some local pharmacies Everyone who will be around your baby should also be up-to-date on their vaccines before the baby comes. Adults (who are not pregnant) only need 1 dose of Tdap during adulthood.   [place] Pediatricians/Family Doctors [facility] ([facility]): [address], [phone]           [facility]: [address], [phone]                [facility] ([facility]): [address], [phone] (call to ask if accepting patients) [facility]: [address], [place], [phone]    [place] Pediatricians/Family Doctors [facility] ([facility]): [address], [phone] [facility]: [address], [phone] [facility]: [address], [phone]  [place] [facility]  [facility] ([facility]): [phone] [facility]: [address], [phone]   [place] Family Doctors [facility]: [address], [phone]  [place] [facility]  [facility]: [address], [phone]  Home Blood Pressure Monitoring for Patients   Your provider has recommended that you check your   blood pressure (BP) at least once a week at home. If you do not have a blood pressure cuff at home, one will be provided for you. Contact your provider if you have not received your monitor within 1 week.   Helpful Tips for Accurate Home Blood Pressure Checks  Don't smoke, exercise, or drink caffeine 30 minutes before checking your BP Use the restroom before checking your BP (a full bladder can raise your  pressure) Relax in a comfortable upright chair Feet on the ground Left arm resting comfortably on a flat surface at the level of your heart Legs uncrossed Back supported Sit quietly and don't talk Place the cuff on your bare arm Adjust snuggly, so that only two fingertips can fit between your skin and the top of the cuff Check 2 readings separated by at least one minute Keep a log of your BP readings For a visual, please reference this diagram: http://ccnc.care/bpdiagram  Provider Name: [facility]     Phone: [phone]  Zone 1: ALL CLEAR  Continue to monitor your symptoms:  BP reading is less than 140 (top number) or less than 90 (bottom number)  No right upper stomach pain No headaches or seeing spots No feeling nauseated or throwing up No swelling in face and hands  Zone 2: CAUTION Call your doctor's office for any of the following:  BP reading is greater than 140 (top number) or greater than 90 (bottom number)  Stomach pain under your ribs in the middle or right side Headaches or seeing spots Feeling nauseated or throwing up Swelling in face and hands  Zone 3: EMERGENCY  Seek immediate medical care if you have any of the following:  BP reading is greater than160 (top number) or greater than 110 (bottom number) Severe headaches not improving with Tylenol Serious difficulty catching your breath Any worsening symptoms from Zone 2  Preterm Labor and Birth Information  The normal length of a pregnancy is 39-41 weeks. Preterm labor is when labor starts before 37 completed weeks of pregnancy. What are the risk factors for preterm labor? Preterm labor is more likely to occur in women who: Have certain infections during pregnancy such as a bladder infection, sexually transmitted infection, or infection inside the uterus (chorioamnionitis). Have a shorter-than-normal cervix. Have gone into preterm labor before. Have had surgery on their cervix. Are younger than age 17  or older than age 35. Are African American. Are pregnant with twins or multiple babies (multiple gestation). Take street drugs or smoke while pregnant. Do not gain enough weight while pregnant. Became pregnant shortly after having been pregnant. What are the symptoms of preterm labor? Symptoms of preterm labor include: Cramps similar to those that can happen during a menstrual period. The cramps may happen with diarrhea. Pain in the abdomen or lower back. Regular uterine contractions that may feel like tightening of the abdomen. A feeling of increased pressure in the pelvis. Increased watery or bloody mucus discharge from the vagina. Water breaking (ruptured amniotic sac). Why is it important to recognize signs of preterm labor? It is important to recognize signs of preterm labor because babies who are born prematurely may not be fully developed. This can put them at an increased risk for: Long-term (chronic) heart and lung problems. Difficulty immediately after birth with regulating body systems, including blood sugar, body temperature, heart rate, and breathing rate. Bleeding in the brain. Cerebral palsy. Learning difficulties. Death. These risks are highest for babies who are born before 34 weeks   of pregnancy. How is preterm labor treated? Treatment depends on the length of your pregnancy, your condition, and the health of your baby. It may involve: Having a stitch (suture) placed in your cervix to prevent your cervix from opening too early (cerclage). Taking or being given medicines, such as: Hormone medicines. These may be given early in pregnancy to help support the pregnancy. Medicine to stop contractions. Medicines to help mature the baby's lungs. These may be prescribed if the risk of delivery is high. Medicines to prevent your baby from developing cerebral palsy. If the labor happens before 34 weeks of pregnancy, you may need to stay in the hospital. What should I do if I  think I am in preterm labor? If you think that you are going into preterm labor, call your health care provider right away. How can I prevent preterm labor in future pregnancies? To increase your chance of having a full-term pregnancy: Do not use any tobacco products, such as cigarettes, chewing tobacco, and e-cigarettes. If you need help quitting, ask your health care provider. Do not use street drugs or medicines that have not been prescribed to you during your pregnancy. Talk with your health care provider before taking any herbal supplements, even if you have been taking them regularly. Make sure you gain a healthy amount of weight during your pregnancy. Watch for infection. If you think that you might have an infection, get it checked right away. Make sure to tell your health care provider if you have gone into preterm labor before. This information is not intended to replace advice given to you by your health care provider. Make sure you discuss any questions you have with your health care provider. Document Revised: 10/20/2018 Document Reviewed: 11/19/2015 Elsevier Patient Education  2020 Elsevier Inc.   

## 2023-12-13 ENCOUNTER — Ambulatory Visit: Admitting: Women's Health

## 2023-12-13 ENCOUNTER — Encounter: Payer: Self-pay | Admitting: Women's Health

## 2023-12-13 VITALS — BP 109/77 | HR 94 | Wt 196.0 lb

## 2023-12-13 DIAGNOSIS — Z3480 Encounter for supervision of other normal pregnancy, unspecified trimester: Secondary | ICD-10-CM

## 2023-12-13 DIAGNOSIS — Z3A34 34 weeks gestation of pregnancy: Secondary | ICD-10-CM | POA: Diagnosis not present

## 2023-12-13 DIAGNOSIS — Z3689 Encounter for other specified antenatal screening: Secondary | ICD-10-CM | POA: Diagnosis not present

## 2023-12-13 DIAGNOSIS — Z3483 Encounter for supervision of other normal pregnancy, third trimester: Secondary | ICD-10-CM | POA: Diagnosis not present

## 2023-12-13 NOTE — Progress Notes (Signed)
 LOW-RISK PREGNANCY VISIT Patient name: [name] MRN [id number]  Date of birth: 14-Jun-1991 Chief Complaint:   Routine Prenatal Visit  History of Present Illness:   [name] is a 33 y.o. [id number] female at [redacted]w[redacted]d with an Estimated Date of Delivery: 01/20/24 being seen today for ongoing management of a low-risk pregnancy.   Today she reports occ sharp pain lower abdomen, woke her up the other night. Contractions: Not present.  .  Movement: Present. denies leaking of fluid.     10/26/2023    9:03 AM 07/14/2021    8:36 AM 04/06/2021    2:13 PM  Depression screen PHQ 2/9  Decreased Interest 0 0 0  Down, Depressed, Hopeless 0 0 0  PHQ - 2 Score 0 0 0  Altered sleeping 0 0 0  Tired, decreased energy 0 0 0  Change in appetite 0 0 0  Feeling bad or failure about yourself  0 0 0  Trouble concentrating 0 0 0  Moving slowly or fidgety/restless 0 0 0  Suicidal thoughts 0 0 0  PHQ-9 Score 0 0 0        10/26/2023    9:04 AM 07/14/2021    8:36 AM 04/06/2021    2:13 PM  GAD 7 : Generalized Anxiety Score  Nervous, Anxious, on Edge 0 0 0  Control/stop worrying 0 0 0  Worry too much - different things 0 0 0  Trouble relaxing 0 0 0  Restless 0 0 0  Easily annoyed or irritable 0 0 0  Afraid - awful might happen 0 0 0  Total GAD 7 Score 0 0 0      Review of Systems:   Pertinent items are noted in HPI Denies abnormal vaginal discharge w/ itching/odor/irritation, headaches, visual changes, shortness of breath, chest pain, abdominal pain, severe nausea/vomiting, or problems with urination or bowel movements unless otherwise stated above. Pertinent History Reviewed:  Reviewed past medical,surgical, social, obstetrical and family history.  Reviewed problem list, medications and allergies. Physical Assessment:   Vitals:   12/13/23 1520  BP: 109/77  Pulse: 94  Weight: 196 lb (88.9 kg)  Body mass index is 31.64 kg/m.        Physical Examination:   General appearance: Well  appearing, and in no distress  Mental status: Alert, oriented to person, place, and time  Skin: Warm & dry  Cardiovascular: Normal heart rate noted  Respiratory: Normal respiratory effort, no distress  Abdomen: Soft, gravid, nontender  Pelvic: Cervical exam deferred         Extremities: Edema: Trace  Fetal Status: Fetal Heart Rate (bpm): 146 Fundal Height: 34 cm Movement: Present    Chaperone: N/A No results found for this or any previous visit (from the past 24 hours).  Assessment & Plan:  1) Low-risk pregnancy [id number] at [redacted]w[redacted]d with an Estimated Date of Delivery: 01/20/24   2) H/O gHTN, bp great, continue checking at home/work, if>140/90 or pre-e s/s, let us  know  3) EFW 95% @ 19wks> rpt next visit  4) Occ lower abdominal pain> discussed   Meds: No orders of the defined types were placed in this encounter.  Labs/procedures today: none  Plan:  Continue routine obstetrical care  Next visit: prefers will be in person for u/s and cultures    Reviewed: Preterm labor symptoms and general obstetric precautions including but not limited to vaginal bleeding, contractions, leaking of fluid and fetal movement were reviewed in detail with the patient.  All questions were answered. Does have home bp cuff. Office bp cuff given: not applicable. Check bp weekly, let us  know if consistently >140 and/or >90.  Follow-up: Return for As scheduled.  Future Appointments  Date Time Provider Department Center  12/26/2023 11:30 AM [facility] US  [facility] None  12/26/2023  1:30 PM [place], DO [facility]    Orders Placed This Encounter  Procedures   US  OB Follow Up   Ferd Householder CNM, [facility] 12/13/2023 3:48 PM

## 2023-12-13 NOTE — Patient Instructions (Signed)
[place], thank you for choosing our office today! We appreciate the opportunity to meet your healthcare needs. You may receive a short survey by mail, e-mail, or through MyChart. If you are happy with your care we would appreciate if you could take just a few minutes to complete the survey questions. We read all of your comments and take your feedback very seriously. Thank you again for choosing our office.  [facility]  [facility] ([address] [place], [place] [address]) Entrance C, located off of [address] Free 24/7 valet parking   CLASSES: Go to [email or web address] to register for classes (childbirth, breastfeeding, waterbirth, infant CPR, daddy bootcamp, etc.)  Call the office ([phone]) or go to [facility] if: You begin to have strong, frequent contractions Your water breaks.  Sometimes it is a big gush of fluid, sometimes it is just a trickle that keeps getting your panties wet or running down your legs You have vaginal bleeding.  It is normal to have a small amount of spotting if your cervix was checked.  You don't feel your baby moving like normal.  If you don't, get you something to eat and drink and lay down and focus on feeling your baby move.   If your baby is still not moving like normal, you should call the office or go to [facility].  Call the office ([phone]) or go to [facility] for these signs of pre-eclampsia: Severe headache that does not go away with Tylenol Visual changes- seeing spots, double, blurred vision Pain under your right breast or upper abdomen that does not go away with Tums or heartburn medicine Nausea and/or vomiting Severe swelling in your hands, feet, and face   Tdap Vaccine It is recommended that you get the Tdap vaccine during the third trimester of EACH pregnancy to help protect your baby from getting pertussis (whooping cough) 27-36 weeks is the BEST time to do  this so that you can pass the protection on to your baby. During pregnancy is better than after pregnancy, but if you are unable to get it during pregnancy it will be offered at the hospital.  You can get this vaccine with us, at the health department, your family doctor, or some local pharmacies Everyone who will be around your baby should also be up-to-date on their vaccines before the baby comes. Adults (who are not pregnant) only need 1 dose of Tdap during adulthood.   [place] Pediatricians/Family Doctors [facility] ([facility]): [address], [phone]           [facility]: [address], [phone]                [facility] ([facility]): [address], [phone] (call to ask if accepting patients) [facility]: [address], [place], [phone]    [place] Pediatricians/Family Doctors [facility] ([facility]): [address], [phone] [facility]: [address], [phone] [facility]: [address], [phone]  [place] [facility]  [facility] ([facility]): [phone] [facility]: [address], [phone]   [place] Family Doctors [facility]: [address], [phone]  [place] [facility]  [facility]: [address], [phone]  Home Blood Pressure Monitoring for Patients   Your provider has recommended that you check your   blood pressure (BP) at least once a week at home. If you do not have a blood pressure cuff at home, one will be provided for you. Contact your provider if you have not received your monitor within 1 week.   Helpful Tips for Accurate Home Blood Pressure Checks  Don't smoke, exercise, or drink caffeine 30 minutes before checking your BP Use the restroom before checking your BP (a full bladder can raise your  pressure) Relax in a comfortable upright chair Feet on the ground Left arm resting comfortably on a flat surface at the level of your heart Legs uncrossed Back supported Sit quietly and don't talk Place the cuff on your bare arm Adjust snuggly, so that only two fingertips can fit between your skin and the top of the cuff Check 2 readings separated by at least one minute Keep a log of your BP readings For a visual, please reference this diagram: http://ccnc.care/bpdiagram  Provider Name: [facility]     Phone: [phone]  Zone 1: ALL CLEAR  Continue to monitor your symptoms:  BP reading is less than 140 (top number) or less than 90 (bottom number)  No right upper stomach pain No headaches or seeing spots No feeling nauseated or throwing up No swelling in face and hands  Zone 2: CAUTION Call your doctor's office for any of the following:  BP reading is greater than 140 (top number) or greater than 90 (bottom number)  Stomach pain under your ribs in the middle or right side Headaches or seeing spots Feeling nauseated or throwing up Swelling in face and hands  Zone 3: EMERGENCY  Seek immediate medical care if you have any of the following:  BP reading is greater than160 (top number) or greater than 110 (bottom number) Severe headaches not improving with Tylenol Serious difficulty catching your breath Any worsening symptoms from Zone 2  Preterm Labor and Birth Information  The normal length of a pregnancy is 39-41 weeks. Preterm labor is when labor starts before 37 completed weeks of pregnancy. What are the risk factors for preterm labor? Preterm labor is more likely to occur in women who: Have certain infections during pregnancy such as a bladder infection, sexually transmitted infection, or infection inside the uterus (chorioamnionitis). Have a shorter-than-normal cervix. Have gone into preterm labor before. Have had surgery on their cervix. Are younger than age 17  or older than age 35. Are African American. Are pregnant with twins or multiple babies (multiple gestation). Take street drugs or smoke while pregnant. Do not gain enough weight while pregnant. Became pregnant shortly after having been pregnant. What are the symptoms of preterm labor? Symptoms of preterm labor include: Cramps similar to those that can happen during a menstrual period. The cramps may happen with diarrhea. Pain in the abdomen or lower back. Regular uterine contractions that may feel like tightening of the abdomen. A feeling of increased pressure in the pelvis. Increased watery or bloody mucus discharge from the vagina. Water breaking (ruptured amniotic sac). Why is it important to recognize signs of preterm labor? It is important to recognize signs of preterm labor because babies who are born prematurely may not be fully developed. This can put them at an increased risk for: Long-term (chronic) heart and lung problems. Difficulty immediately after birth with regulating body systems, including blood sugar, body temperature, heart rate, and breathing rate. Bleeding in the brain. Cerebral palsy. Learning difficulties. Death. These risks are highest for babies who are born before 34 weeks   of pregnancy. How is preterm labor treated? Treatment depends on the length of your pregnancy, your condition, and the health of your baby. It may involve: Having a stitch (suture) placed in your cervix to prevent your cervix from opening too early (cerclage). Taking or being given medicines, such as: Hormone medicines. These may be given early in pregnancy to help support the pregnancy. Medicine to stop contractions. Medicines to help mature the baby's lungs. These may be prescribed if the risk of delivery is high. Medicines to prevent your baby from developing cerebral palsy. If the labor happens before 34 weeks of pregnancy, you may need to stay in the hospital. What should I do if I  think I am in preterm labor? If you think that you are going into preterm labor, call your health care provider right away. How can I prevent preterm labor in future pregnancies? To increase your chance of having a full-term pregnancy: Do not use any tobacco products, such as cigarettes, chewing tobacco, and e-cigarettes. If you need help quitting, ask your health care provider. Do not use street drugs or medicines that have not been prescribed to you during your pregnancy. Talk with your health care provider before taking any herbal supplements, even if you have been taking them regularly. Make sure you gain a healthy amount of weight during your pregnancy. Watch for infection. If you think that you might have an infection, get it checked right away. Make sure to tell your health care provider if you have gone into preterm labor before. This information is not intended to replace advice given to you by your health care provider. Make sure you discuss any questions you have with your health care provider. Document Revised: 10/20/2018 Document Reviewed: 11/19/2015 Elsevier Patient Education  2020 Elsevier Inc.   

## 2023-12-22 ENCOUNTER — Telehealth: Payer: Self-pay | Admitting: *Deleted

## 2023-12-22 NOTE — Telephone Encounter (Signed)
 Patient called stating she started having cramping last night that seems to have continued to this morning. States she does not feel like they are contractions but is not really sure. She is sitting more at work so is not doing any lifting, pulling or pushing.  Denies bleeding or abnormal discharge.  Encouraged patient to push more fluids as she could be slightly dehydrated since it has been warmer recently as well as wearing a maternity band for added support as this is her third baby.  Advised if pain worsened or developed bleeding would need to be seen.  Pt verbalized understanding.

## 2023-12-26 ENCOUNTER — Ambulatory Visit

## 2023-12-26 ENCOUNTER — Other Ambulatory Visit (HOSPITAL_COMMUNITY)
Admission: RE | Admit: 2023-12-26 | Discharge: 2023-12-26 | Disposition: A | Discharge status: Home / Self Care | Source: Ambulatory Visit | Attending: Obstetrics & Gynecology | Admitting: Obstetrics & Gynecology

## 2023-12-26 ENCOUNTER — Ambulatory Visit: Admitting: Obstetrics & Gynecology

## 2023-12-26 ENCOUNTER — Encounter: Payer: Self-pay | Admitting: Obstetrics & Gynecology

## 2023-12-26 VITALS — BP 120/81 | HR 91 | Wt 198.2 lb

## 2023-12-26 DIAGNOSIS — Z3483 Encounter for supervision of other normal pregnancy, third trimester: Secondary | ICD-10-CM

## 2023-12-26 DIAGNOSIS — O365930 Maternal care for other known or suspected poor fetal growth, third trimester, not applicable or unspecified: Secondary | ICD-10-CM | POA: Diagnosis not present

## 2023-12-26 DIAGNOSIS — O09293 Supervision of pregnancy with other poor reproductive or obstetric history, third trimester: Secondary | ICD-10-CM | POA: Diagnosis not present

## 2023-12-26 DIAGNOSIS — Z3689 Encounter for other specified antenatal screening: Secondary | ICD-10-CM | POA: Diagnosis not present

## 2023-12-26 DIAGNOSIS — Z3480 Encounter for supervision of other normal pregnancy, unspecified trimester: Secondary | ICD-10-CM

## 2023-12-26 DIAGNOSIS — Z3A36 36 weeks gestation of pregnancy: Secondary | ICD-10-CM

## 2023-12-26 NOTE — Progress Notes (Signed)
   LOW-RISK PREGNANCY VISIT Patient name: [name] MRN [id number]  Date of birth: [birth year] Chief Complaint:   Routine Prenatal Visit  History of Present Illness:   [name] is a 33 y.o. [id number] female at [redacted]w[redacted]d with an Estimated Date of Delivery: 01/20/24 being seen today for ongoing management of a low-risk pregnancy.   -started to have elevated BP at home diastolic in the 90s -currently asymptomatic     10/26/2023    9:03 AM 07/14/2021    8:36 AM 04/06/2021    2:13 PM  Depression screen PHQ 2/9  Decreased Interest 0 0 0  Down, Depressed, Hopeless 0 0 0  PHQ - 2 Score 0 0 0  Altered sleeping 0 0 0  Tired, decreased energy 0 0 0  Change in appetite 0 0 0  Feeling bad or failure about yourself  0 0 0  Trouble concentrating 0 0 0  Moving slowly or fidgety/restless 0 0 0  Suicidal thoughts 0 0 0  PHQ-9 Score 0 0 0    Today she reports no complaints. Contractions: Irritability. Vag. Bleeding: None.  Movement: Present. denies leaking of fluid. Review of Systems:   Pertinent items are noted in HPI Denies abnormal vaginal discharge w/ itching/odor/irritation, headaches, visual changes, shortness of breath, chest pain, abdominal pain, severe nausea/vomiting, or problems with urination or bowel movements unless otherwise stated above. Pertinent History Reviewed:  Reviewed past medical,surgical, social, obstetrical and family history.  Reviewed problem list, medications and allergies.  Physical Assessment:   Vitals:   12/26/23 1221  BP: 120/81  Pulse: 91  Weight: 198 lb 3.2 oz (89.9 kg)  Body mass index is 31.99 kg/m.        Physical Examination:   General appearance: Well appearing, and in no distress  Mental status: Alert, oriented to person, place, and time  Skin: Warm & dry  Respiratory: Normal respiratory effort, no distress  Abdomen: Soft, gravid, nontender  Pelvic: Cervical exam performed  Dilation: Fingertip Effacement (%): Thick Station: -3 GC/C, GBS  collected  Extremities:  minimal edema  Psych:  mood and affect appropriate  Fetal Status:     Movement: Present  cephalic,FHR 137 bpm,posterior placenta gr 3,AFI 15 cm,EFW 3747 g 99%   Chaperone: Wendell Halt    No results found for this or any previous visit (from the past 24 hours).   Assessment & Plan:  1) Low-risk pregnancy [id number] at [redacted]w[redacted]d with an Estimated Date of Delivery: 01/20/24   2) LGA -US  today as above, discussed if desired eIOL @ 39wk  3) h/o GestHTN -starting to see increase at home, continue to monitor -reviewed precautions   Meds: No orders of the defined types were placed in this encounter.  Labs/procedures today: growth scan, vaginal swabs  Plan:  Continue routine obstetrical care  Next visit: prefers in person    Reviewed: Preterm labor symptoms and general obstetric precautions including but not limited to vaginal bleeding, contractions, leaking of fluid and fetal movement were reviewed in detail with the patient.  All questions were answered. Pt has home bp cuff. Check bp weekly, let us  know if >140/90.   Follow-up: Return in about 1 week (around 01/02/2024) for LROB visit.  Orders Placed This Encounter  Procedures   Culture, beta strep (group b only)    Dayn Barich, DO Attending Obstetrician & Gynecologist, Faculty Practice Center for [employer], [facility]

## 2023-12-26 NOTE — Progress Notes (Signed)
 US  36+3 wks,cephalic,FHR 137 bpm,posterior placenta gr 3,AFI 15 cm,EFW 3747 g 99%

## 2023-12-27 ENCOUNTER — Encounter: Payer: Self-pay | Admitting: Obstetrics & Gynecology

## 2023-12-27 ENCOUNTER — Ambulatory Visit: Payer: Self-pay | Admitting: Obstetrics & Gynecology

## 2023-12-27 ENCOUNTER — Other Ambulatory Visit: Payer: Self-pay | Admitting: Obstetrics & Gynecology

## 2023-12-27 DIAGNOSIS — Z3480 Encounter for supervision of other normal pregnancy, unspecified trimester: Secondary | ICD-10-CM

## 2023-12-27 LAB — CERVICOVAGINAL ANCILLARY ONLY
Chlamydia: NEGATIVE
Comment: NEGATIVE
Comment: NORMAL
Neisseria Gonorrhea: NEGATIVE

## 2023-12-27 MED ORDER — OMEPRAZOLE 20 MG PO TBEC: 20.0000 mg | DELAYED_RELEASE_TABLET | Freq: Two times a day (BID) | ORAL | 1 refills | Status: DC | PRN

## 2023-12-27 NOTE — Progress Notes (Signed)
 Rx for prilosec  Jacon Whetzel, DO Attending Obstetrician & Gynecologist, [facility] for [facility], [facility]

## 2023-12-28 ENCOUNTER — Encounter: Payer: Self-pay | Admitting: *Deleted

## 2023-12-30 ENCOUNTER — Encounter: Payer: Self-pay | Admitting: Obstetrics & Gynecology

## 2023-12-30 DIAGNOSIS — L299 Pruritus, unspecified: Secondary | ICD-10-CM

## 2023-12-30 LAB — CULTURE, BETA STREP (GROUP B ONLY): Strep Gp B Culture: NEGATIVE

## 2023-12-30 NOTE — Addendum Note (Signed)
 Addended by: Wendelyn Halter on: 12/30/2023 12:35 PM   Modules accepted: Orders

## 2024-01-01 ENCOUNTER — Inpatient Hospital Stay (HOSPITAL_COMMUNITY): Admitting: Anesthesiology

## 2024-01-01 ENCOUNTER — Inpatient Hospital Stay (HOSPITAL_COMMUNITY)
Admission: AD | Admit: 2024-01-01 | Discharge: 2024-01-03 | DRG: 807 | Disposition: A | Discharge status: Home / Self Care | Attending: Obstetrics and Gynecology | Admitting: Obstetrics and Gynecology

## 2024-01-01 ENCOUNTER — Encounter (HOSPITAL_COMMUNITY): Payer: Self-pay | Admitting: Obstetrics and Gynecology

## 2024-01-01 DIAGNOSIS — O3663X0 Maternal care for excessive fetal growth, third trimester, not applicable or unspecified: Secondary | ICD-10-CM | POA: Diagnosis present

## 2024-01-01 DIAGNOSIS — O326XX0 Maternal care for compound presentation, not applicable or unspecified: Secondary | ICD-10-CM | POA: Diagnosis not present

## 2024-01-01 DIAGNOSIS — Z7982 Long term (current) use of aspirin: Secondary | ICD-10-CM

## 2024-01-01 DIAGNOSIS — Z3A37 37 weeks gestation of pregnancy: Secondary | ICD-10-CM | POA: Diagnosis not present

## 2024-01-01 DIAGNOSIS — O2662 Liver and biliary tract disorders in childbirth: Secondary | ICD-10-CM | POA: Diagnosis not present

## 2024-01-01 DIAGNOSIS — O26643 Intrahepatic cholestasis of pregnancy, third trimester: Principal | ICD-10-CM | POA: Diagnosis present

## 2024-01-01 DIAGNOSIS — Z3490 Encounter for supervision of normal pregnancy, unspecified, unspecified trimester: Principal | ICD-10-CM

## 2024-01-01 LAB — COMPREHENSIVE METABOLIC PANEL WITH GFR
ALT: 12 U/L (ref 0–44)
AST: 19 U/L (ref 15–41)
Albumin: 2.3 g/dL — ABNORMAL LOW (ref 3.5–5.0)
Alkaline Phosphatase: 113 U/L (ref 38–126)
Anion gap: 9 (ref 5–15)
BUN: <5 mg/dL — ABNORMAL LOW (ref 6–20)
CO2: 21 mmol/L — ABNORMAL LOW (ref 22–32)
Calcium: 8.3 mg/dL — ABNORMAL LOW (ref 8.9–10.3)
Chloride: 108 mmol/L (ref 98–111)
Creatinine, Ser: 0.56 mg/dL (ref 0.44–1.00)
GFR, Estimated: >60 mL/min (ref >60)
Glucose, Bld: 90 mg/dL (ref 70–99)
Potassium: 3.8 mmol/L (ref 3.5–5.1)
Sodium: 138 mmol/L (ref 135–145)
Total Bilirubin: 0.5 mg/dL (ref 0.0–1.2)
Total Protein: 5.5 g/dL — ABNORMAL LOW (ref 6.5–8.1)

## 2024-01-01 LAB — PROTEIN / CREATININE RATIO, URINE
Creatinine, Urine: 103 mg/dL
Protein Creatinine Ratio: 0.07 mg/mg{creat} (ref 0.00–0.15)
Total Protein, Urine: 7 mg/dL

## 2024-01-01 LAB — CBC
HCT: 31.1 % — ABNORMAL LOW (ref 36.0–46.0)
Hemoglobin: 10 g/dL — ABNORMAL LOW (ref 12.0–15.0)
MCH: 27.6 pg (ref 26.0–34.0)
MCHC: 32.2 g/dL (ref 30.0–36.0)
MCV: 85.9 fL (ref 80.0–100.0)
Platelets: 218 10*3/uL (ref 150–400)
RBC: 3.62 MIL/uL — ABNORMAL LOW (ref 3.87–5.11)
RDW: 15.7 % — ABNORMAL HIGH (ref 11.5–15.5)
WBC: 7.8 10*3/uL (ref 4.0–10.5)
nRBC: 0 % (ref 0.0–0.2)

## 2024-01-01 LAB — TYPE AND SCREEN
ABO/RH(D): O POS
Antibody Screen: NEGATIVE

## 2024-01-01 LAB — BILE ACIDS, TOTAL: Bile Acids Total: 11.3 umol/L — ABNORMAL HIGH (ref 0.0–10.0)

## 2024-01-01 MED ORDER — ONDANSETRON HCL 4 MG/2ML IJ SOLN
4.0000 mg | Freq: Four times a day (QID) | INTRAMUSCULAR | Status: DC | PRN
  Administered 2024-01-02: 4 mg via INTRAVENOUS
  Filled 2024-01-01: qty 2

## 2024-01-01 MED ORDER — FLEET ENEMA RE ENEM: 1.0000 | ENEMA | Freq: Every day | RECTAL | Status: DC | PRN

## 2024-01-01 MED ORDER — PHENYLEPHRINE 80 MCG/ML (10ML) SYRINGE FOR IV PUSH (FOR BLOOD PRESSURE SUPPORT)
80.0000 ug | PREFILLED_SYRINGE | INTRAVENOUS | Status: DC | PRN
  Filled 2024-01-01: qty 10

## 2024-01-01 MED ORDER — DIPHENHYDRAMINE HCL 50 MG/ML IJ SOLN
12.5000 mg | INTRAMUSCULAR | Status: DC | PRN
  Administered 2024-01-01: 12.5 mg via INTRAVENOUS
  Filled 2024-01-01: qty 1

## 2024-01-01 MED ORDER — ACETAMINOPHEN 325 MG PO TABS: 650.0000 mg | ORAL_TABLET | ORAL | Status: DC | PRN

## 2024-01-01 MED ORDER — EPHEDRINE 5 MG/ML INJ
10.0000 mg | INTRAVENOUS | Status: DC | PRN
Start: 2024-01-01 — End: 2024-01-02

## 2024-01-01 MED ORDER — SOD CITRATE-CITRIC ACID 500-334 MG/5ML PO SOLN: 30.0000 mL | ORAL | Status: DC | PRN

## 2024-01-01 MED ORDER — PHENYLEPHRINE 80 MCG/ML (10ML) SYRINGE FOR IV PUSH (FOR BLOOD PRESSURE SUPPORT): 80.0000 ug | PREFILLED_SYRINGE | INTRAVENOUS | Status: DC | PRN

## 2024-01-01 MED ORDER — OXYTOCIN BOLUS FROM INFUSION
333.0000 mL | Freq: Once | INTRAVENOUS | Status: AC
Start: 2024-01-01 — End: 2024-01-02
  Administered 2024-01-02: 333 mL via INTRAVENOUS

## 2024-01-01 MED ORDER — LACTATED RINGERS IV SOLN
500.0000 mL | Freq: Once | INTRAVENOUS | Status: AC
  Administered 2024-01-01: 500 mL via INTRAVENOUS

## 2024-01-01 MED ORDER — OXYTOCIN-SODIUM CHLORIDE 30-0.9 UT/500ML-% IV SOLN
2.5000 [IU]/h | INTRAVENOUS | Status: DC
  Filled 2024-01-01: qty 500

## 2024-01-01 MED ORDER — EPHEDRINE 5 MG/ML INJ: 10.0000 mg | INTRAVENOUS | Status: DC | PRN

## 2024-01-01 MED ORDER — LIDOCAINE HCL (PF) 1 % IJ SOLN
30.0000 mL | INTRAMUSCULAR | Status: DC | PRN
Start: 2024-01-01 — End: 2024-01-02

## 2024-01-01 MED ORDER — FENTANYL-BUPIVACAINE-NACL 0.5-0.125-0.9 MG/250ML-% EP SOLN
12.0000 mL/h | EPIDURAL | Status: DC | PRN
  Administered 2024-01-01: 12 mL/h via EPIDURAL
  Filled 2024-01-01: qty 250

## 2024-01-01 MED ORDER — LACTATED RINGERS IV SOLN: INTRAVENOUS | Status: DC

## 2024-01-01 MED ORDER — OXYCODONE-ACETAMINOPHEN 5-325 MG PO TABS: 2.0000 | ORAL_TABLET | ORAL | Status: DC | PRN

## 2024-01-01 MED ORDER — LIDOCAINE HCL (PF) 1 % IJ SOLN
INTRAMUSCULAR | Status: DC | PRN
  Administered 2024-01-01: 5 mL via EPIDURAL

## 2024-01-01 MED ORDER — OXYCODONE-ACETAMINOPHEN 5-325 MG PO TABS: 1.0000 | ORAL_TABLET | ORAL | Status: DC | PRN

## 2024-01-01 MED ORDER — LACTATED RINGERS IV SOLN: 500.0000 mL | INTRAVENOUS | Status: DC | PRN

## 2024-01-01 NOTE — H&P (Signed)
 OBSTETRIC ADMISSION HISTORY AND PHYSICAL  [name] is a 33 y.o. female [phone] with IUP at [redacted]w[redacted]d by LMP presenting for IOL for cholestasis. She reports +FMs, No LOF, no VB, no blurry vision, headaches or peripheral edema, and RUQ pain.  She plans on breast feeding. She requests POPs for birth control. She received her prenatal care at [facility]   Dating: By LMP --->  Estimated Date of Delivery: 01/20/24  Sono:   @[redacted]w[redacted]d , CWD, normal anatomy, posterior placenta, cephalic presentation,  3747g, 99% EFW   Prenatal History/Complications:  - Cholestasis: Bile acid 11.3 - LGA, pelvis proven to 3535g - Hx gHTN: mild range BP on admit, no meds, controlled throughout preg  Past Medical History: Past Medical History:  Diagnosis Date   Gestational hypertension, antepartum 06/06/2012   PONV (postoperative nausea and vomiting)    Pyelonephritis    Urinary tract infection     Past Surgical History: Past Surgical History:  Procedure Laterality Date   Bladder stem stretcher     DILATION AND EVACUATION N/A 03/30/2016   Procedure: DILATATION AND EVACUATION--suction D&C;  Surgeon: Vonn VEAR Inch, MD;  Location: [facility];  Service: Gynecology;  Laterality: N/A;   GALLBLADDER SURGERY     TONSILLECTOMY      Obstetrical History: OB History     Gravida  4   Para  2   Term  2   Preterm  0   AB  1   Living  2      SAB  1   IAB  0   Ectopic  0   Multiple  0   Live Births  2           Social History Social History   Socioeconomic History   Marital status: Married    Spouse name: Not on file   Number of children: Not on file   Years of education: Not on file   Highest education level: Not on file  Occupational History   Not on file  Tobacco Use   Smoking status: Never   Smokeless tobacco: Never  Vaping Use   Vaping status: Never Used  Substance and Sexual Activity   Alcohol use: No   Drug use: No   Sexual activity: Yes    Birth control/protection: None   Other Topics Concern   Not on file  Social History Narrative   Not on file   Social Drivers of Health   Financial Resource Strain: Low Risk  (07/14/2023)   Overall Financial Resource Strain (CARDIA)    Difficulty of Paying Living Expenses: Not hard at all  Food Insecurity: No Food Insecurity (01/01/2024)   Hunger Vital Sign    Worried About Running Out of Food in the Last Year: Never true    Ran Out of Food in the Last Year: Never true  Transportation Needs: No Transportation Needs (01/01/2024)   PRAPARE - Transportation    Lack of Transportation (Medical): No    Lack of Transportation (Non-Medical): No  Physical Activity: Insufficiently Active (07/14/2023)   Exercise Vital Sign    Days of Exercise per Week: 3 days    Minutes of Exercise per Session: 30 min  Stress: No Stress Concern Present (07/14/2023)   [employer] of Occupational Health - Occupational Stress Questionnaire    Feeling of Stress : Not at all  Social Connections: Socially Integrated (07/14/2023)   Social Connection and Isolation Panel    Frequency of Communication with Friends and Family: More than three  times a week    Frequency of Social Gatherings with Friends and Family: More than three times a week    Attends Religious Services: More than 4 times per year    Active Member of [employer] or Organizations: Yes    Attends Banker Meetings: Never    Marital Status: Married    Family History: Family History  Problem Relation Age of Onset   Breast cancer Mother    Leukemia Paternal Grandfather    Other Neg Hx     Allergies: No Known Allergies  Medications Prior to Admission  Medication Sig Dispense Refill Last Dose/Taking   Omeprazole  20 MG TBEC Take 1 tablet (20 mg total) by mouth 2 (two) times daily as needed. 60 tablet 1    acetaminophen  (TYLENOL ) 325 MG tablet Take 2 tablets (650 mg total) by mouth every 4 (four) hours as needed (for pain scale < 4).      aspirin  EC 81 MG tablet Take 2  tablets (162 mg total) by mouth daily. Swallow whole. 180 tablet 2    Blood Pressure Monitor MISC For regular home bp monitoring during pregnancy 1 each 0    OVER THE COUNTER MEDICATION Prenatal gummies        Review of Systems  Per HPI  Blood pressure (!) 128/93, pulse 97, temperature 98.8 F (37.1 C), temperature source Oral, resp. rate 18, height 5' 9 (1.753 m), weight 92 kg, last menstrual period 04/15/2023. General appearance: alert and cooperative Lungs: no respiratory distress Heart: regular rate  Abdomen: gravid Extremities: Homans sign is negative, no sign of DVT Presentation: cephalic Fetal monitoringBaseline: 130 bpm, Variability: Good {> 6 bpm), Accelerations: Reactive, and Decelerations: Absent Uterine activity variable     Prenatal labs: ABO, Rh: O/Positive/-- (01/02 1640) Antibody: Negative (04/16 0825) Rubella: 2.56 (01/02 1640) RPR: Non Reactive (04/16 0825)  HBsAg: Negative (01/02 1640)  HIV: Non Reactive (04/16 0825)  GBS: Negative/-- (06/16 1530)  1 hr Glucola: Normal  Genetic screening:  LR Anatomy US : Female, normal   Prenatal Transfer Tool  Maternal Diabetes: No Genetic Screening: Normal Maternal Ultrasounds/Referrals: Normal Fetal Ultrasounds or other Referrals:  None Maternal Substance Abuse:  No Significant Maternal Medications:  None Significant Maternal Lab Results: Group B Strep negative  No results found for this or any previous visit (from the past 24 hours).  Patient Active Problem List   Diagnosis Date Noted   Pregnant 01/01/2024   Encounter for supervision of normal pregnancy, antepartum 07/12/2023   History of gestational hypertension 04/06/2021   Family history of breast cancer in mother 04/06/2021    Assessment/Plan:  [name] is a 33 y.o. [id number] at [redacted]w[redacted]d here for IOL for cholestasis.   #Labor: IOL for cholestasis. Discussed process of IOL in detail w pt. Cx 1cm, soft, anterior. Pt amenable to FB. FB placed and  uterine balloon inflated to 40cc. Pt tolerated well. #Pain: Per pt request. #FWB: Cat 1 #ID:  GBS negative  #MOF: Breast #MOC: POPs #Circ:  N/a  #Cholestasis: Recent bile acid 11.3  reports itching recently started  #LGA: pelvis proven to 3535g previously  EFW 3747g - 99%tile  #Hx gHTN: Mild range BP on admit  PEC labs pending  Damien Cassis, MD  Center for [facility], [facility] 01/01/2024, 11:08 AM   Attestation of Supervision of Resident:  I confirm that I have verified the information documented in the resident's note and that I have also personally reperformed the history, physical exam  and all medical decision making activities.  I have verified that all services and findings are accurately documented in this resident's note; and I agree with management and plan as outlined in the documentation. I have also made any necessary editorial changes.   Alain Sor, MD OB Fellow, Faculty Practice [facility], Center for [facility]

## 2024-01-01 NOTE — Anesthesia Procedure Notes (Signed)
 Epidural Patient location during procedure: OB Start time: 01/01/2024 7:40 PM End time: 01/01/2024 7:49 PM  Staffing Anesthesiologist: Jefm Garnette LABOR, MD Performed: anesthesiologist   Preanesthetic Checklist Completed: patient identified, IV checked, site marked, risks and benefits discussed, surgical consent, monitors and equipment checked, pre-op evaluation and timeout performed  Epidural Patient position: sitting Prep: DuraPrep and site prepped and draped Patient monitoring: continuous pulse ox and blood pressure Approach: midline Location: L3-L4 Injection technique: LOR air  Needle:  Needle type: Tuohy  Needle gauge: 17 G Needle length: 9 cm and 9 Needle insertion depth: 5 cm Catheter type: closed end flexible Catheter size: 19 Gauge Catheter at skin depth: 11 cm Test dose: negative  Assessment Events: blood not aspirated, no cerebrospinal fluid, injection not painful, no injection resistance, no paresthesia and negative IV test  Additional Notes Patient identified. Risks/Benefits/Options discussed with patient including but not limited to bleeding, infection, nerve damage, paralysis, failed block, incomplete pain control, headache, blood pressure changes, nausea, vomiting, reactions to medication both or allergic, itching and postpartum back pain. Confirmed with bedside nurse the patient's most recent platelet count. Confirmed with patient that they are not currently taking any anticoagulation, have any bleeding history or any family history of bleeding disorders. Patient expressed understanding and wished to proceed. All questions were answered. Sterile technique was used throughout the entire procedure. Please see nursing notes for vital signs. Test dose was given through epidural needle and negative prior to continuing to dose epidural or start infusion. Warning signs of high block given to the patient including shortness of breath, tingling/numbness in hands, complete  motor block, or any concerning symptoms with instructions to call for help. Patient was given instructions on fall risk and not to get out of bed. All questions and concerns addressed with instructions to call with any issues.  1 Attempt (S) . Patient tolerated procedure well.

## 2024-01-01 NOTE — Anesthesia Preprocedure Evaluation (Addendum)
 Anesthesia Evaluation  Patient identified by MRN, date of birth, ID band Patient awake    Reviewed: Allergy & Precautions, NPO status , Patient's Chart, lab work & pertinent test results  Airway Mallampati: II  TM Distance: >3 FB Neck ROM: Full    Dental no notable dental hx. (+) Teeth Intact, Dental Advisory Given   Pulmonary neg pulmonary ROS   Pulmonary exam normal breath sounds clear to auscultation       Cardiovascular hypertension, Normal cardiovascular exam Rhythm:Regular Rate:Normal     Neuro/Psych negative neurological ROS     GI/Hepatic Cholestasis  Lab Results      Component                Value               Date                      ALT                      12                  01/01/2024                AST                      19                  01/01/2024                ALKPHOS                  113                 01/01/2024                BILITOT                  0.5                 01/01/2024              Endo/Other    Renal/GU      Musculoskeletal   Abdominal   Peds  Hematology Lab Results      Component                Value               Date                      WBC                      7.8                 01/01/2024                HGB                      10.0 (L)            01/01/2024                HCT                      31.1 (L)            01/01/2024  MCV                      85.9                01/01/2024                PLT                      218                 01/01/2024              Anesthesia Other Findings   Reproductive/Obstetrics (+) Pregnancy                             Anesthesia Physical Anesthesia Plan  ASA: 3  Anesthesia Plan: Epidural   Post-op Pain Management:    Induction:   PONV Risk Score and Plan:   Airway Management Planned:   Additional Equipment:   Intra-op Plan:   Post-operative Plan:   Informed  Consent: I have reviewed the patients History and Physical, chart, labs and discussed the procedure including the risks, benefits and alternatives for the proposed anesthesia with the patient or authorized representative who has indicated his/her understanding and acceptance.       Plan Discussed with:   Anesthesia Plan Comments: (37.2 wk G4P2 W cholestasis for LEA)       Anesthesia Quick Evaluation

## 2024-01-01 NOTE — Progress Notes (Signed)
 Labor progress note  In to check on pt, feeling much more pressure. Cx checked -- FB in vagina and removed at the time. Cx now 4/50/-2. Discussed AROM and Pitocin  w pt, pt amenable to both measures. Had rapid progression of labor w last baby, so would like epidural first then ok with AROM and starting Pitocin . Will sign out to night team. Cat I tracing.   Alain Sor, MD OB Fellow, Faculty Practice [facility], Center for [facility]

## 2024-01-02 ENCOUNTER — Encounter: Admitting: Obstetrics & Gynecology

## 2024-01-02 ENCOUNTER — Other Ambulatory Visit: Payer: Self-pay

## 2024-01-02 ENCOUNTER — Encounter (HOSPITAL_COMMUNITY): Payer: Self-pay | Admitting: Obstetrics and Gynecology

## 2024-01-02 DIAGNOSIS — Z3A37 37 weeks gestation of pregnancy: Secondary | ICD-10-CM | POA: Diagnosis not present

## 2024-01-02 DIAGNOSIS — O2662 Liver and biliary tract disorders in childbirth: Secondary | ICD-10-CM | POA: Diagnosis not present

## 2024-01-02 DIAGNOSIS — O326XX0 Maternal care for compound presentation, not applicable or unspecified: Secondary | ICD-10-CM | POA: Diagnosis not present

## 2024-01-02 DIAGNOSIS — O3663X0 Maternal care for excessive fetal growth, third trimester, not applicable or unspecified: Secondary | ICD-10-CM | POA: Diagnosis not present

## 2024-01-02 LAB — CBC
HCT: 28.9 % — ABNORMAL LOW (ref 36.0–46.0)
Hemoglobin: 9.1 g/dL — ABNORMAL LOW (ref 12.0–15.0)
MCH: 27.1 pg (ref 26.0–34.0)
MCHC: 31.5 g/dL (ref 30.0–36.0)
MCV: 86 fL (ref 80.0–100.0)
Platelets: 203 10*3/uL (ref 150–400)
RBC: 3.36 MIL/uL — ABNORMAL LOW (ref 3.87–5.11)
RDW: 15.8 % — ABNORMAL HIGH (ref 11.5–15.5)
WBC: 14.2 10*3/uL — ABNORMAL HIGH (ref 4.0–10.5)
nRBC: 0 % (ref 0.0–0.2)

## 2024-01-02 LAB — RPR: RPR Ser Ql: NONREACTIVE

## 2024-01-02 MED ORDER — DIPHENHYDRAMINE HCL 25 MG PO CAPS: 25.0000 mg | ORAL_CAPSULE | Freq: Four times a day (QID) | ORAL | Status: DC | PRN

## 2024-01-02 MED ORDER — DIBUCAINE (PERIANAL) 1 % EX OINT: 1.0000 | TOPICAL_OINTMENT | CUTANEOUS | Status: DC | PRN

## 2024-01-02 MED ORDER — ONDANSETRON HCL 4 MG PO TABS: 4.0000 mg | ORAL_TABLET | ORAL | Status: DC | PRN

## 2024-01-02 MED ORDER — IBUPROFEN 600 MG PO TABS
600.0000 mg | ORAL_TABLET | Freq: Four times a day (QID) | ORAL | Status: DC
  Administered 2024-01-02 (×3): 600 mg via ORAL
  Filled 2024-01-02 (×4): qty 1

## 2024-01-02 MED ORDER — PRENATAL MULTIVITAMIN CH
1.0000 | ORAL_TABLET | Freq: Every day | ORAL | Status: DC
Start: 2024-01-02 — End: 2024-01-03
  Administered 2024-01-02: 1 via ORAL
  Filled 2024-01-02: qty 1

## 2024-01-02 MED ORDER — SENNOSIDES-DOCUSATE SODIUM 8.6-50 MG PO TABS
2.0000 | ORAL_TABLET | ORAL | Status: DC
Start: 2024-01-02 — End: 2024-01-03
  Administered 2024-01-02: 2 via ORAL
  Filled 2024-01-02: qty 2

## 2024-01-02 MED ORDER — SODIUM CHLORIDE 0.9 % IV SOLN: 250.0000 mL | INTRAVENOUS | Status: DC | PRN

## 2024-01-02 MED ORDER — COCONUT OIL OIL: 1.0000 | TOPICAL_OIL | Status: DC | PRN

## 2024-01-02 MED ORDER — BENZOCAINE-MENTHOL 20-0.5 % EX AERO: 1.0000 | INHALATION_SPRAY | CUTANEOUS | Status: DC | PRN

## 2024-01-02 MED ORDER — SODIUM CHLORIDE 0.9% FLUSH: 3.0000 mL | INTRAVENOUS | Status: DC | PRN

## 2024-01-02 MED ORDER — SIMETHICONE 80 MG PO CHEW: 80.0000 mg | CHEWABLE_TABLET | ORAL | Status: DC | PRN

## 2024-01-02 MED ORDER — ACETAMINOPHEN 325 MG PO TABS
650.0000 mg | ORAL_TABLET | ORAL | Status: DC | PRN
  Administered 2024-01-02: 650 mg via ORAL
  Filled 2024-01-02: qty 2

## 2024-01-02 MED ORDER — ZOLPIDEM TARTRATE 5 MG PO TABS
5.0000 mg | ORAL_TABLET | Freq: Every evening | ORAL | Status: DC | PRN
Start: 2024-01-02 — End: 2024-01-03

## 2024-01-02 MED ORDER — SODIUM CHLORIDE 0.9% FLUSH: 3.0000 mL | Freq: Two times a day (BID) | INTRAVENOUS | Status: DC

## 2024-01-02 MED ORDER — ONDANSETRON HCL 4 MG/2ML IJ SOLN: 4.0000 mg | INTRAMUSCULAR | Status: DC | PRN

## 2024-01-02 MED ORDER — WITCH HAZEL-GLYCERIN EX PADS
1.0000 | MEDICATED_PAD | CUTANEOUS | Status: DC | PRN
Start: 2024-01-02 — End: 2024-01-03

## 2024-01-02 NOTE — Lactation Note (Signed)
 This note was copied from a baby's chart. Lactation Consultation Note  Patient Name: [name] Unijb'd Date: 01/02/2024 Age:32 hours, P3 Experienced breast feeder -  Reason for consult: Initial assessment;Early term 37-38.6wks (LC encouraged to call for Latch assessment when baby showing feeding cues.) Pe rmom last attempted to latch at 12 n and baby has been sleepy. Baby breast fed well after birth and when she got up to [facility]. 11-7 [facility] nurse did a latch score 9. Baby has wet and stooled several times.  LC reviewed breast feeding basics and 24 hour feeding goals of feed with cues and by 3 hours offer the breast.  LC reassured mom she has time to allow the baby to get hungry and also since she is an experienced breast feeding mom her milk should come in quicker.  Mom had tried giving the baby supplement and she wasn't interested.     Maternal Data  Per mom breast fed her 2nd baby 18 months   Feeding Mother's Current Feeding Choice: Breast Milk  LATCH Score -9     Lactation Tools Discussed/Used  None as of yet   Interventions Interventions: Breast feeding basics reviewed;Education;LC Services brochure;CDC milk storage guidelines;CDC Guidelines for Breast Pump Cleaning  Discharge Pump: DEBP;Hands Free;Personal WIC Program: No  Consult Status Consult Status: Follow-up Date: 01/02/24 Follow-up type: In-patient    Rollene Caldron Marycatherine Maniscalco 01/02/2024, 12:39 PM

## 2024-01-02 NOTE — Progress Notes (Addendum)
 The Rn called the MD regarding during the patient's stay that some of the previous systolic blood pressure has been equal to or over 85. No further orders were given.

## 2024-01-02 NOTE — Anesthesia Postprocedure Evaluation (Signed)
 Anesthesia Post Note  Patient: [name]  Procedure(s) Performed: AN AD HOC LABOR EPIDURAL     Patient location during evaluation: Mother Baby Anesthesia Type: Epidural Level of consciousness: awake Pain management: satisfactory to patient Vital Signs Assessment: post-procedure vital signs reviewed and stable Respiratory status: spontaneous breathing Cardiovascular status: stable Anesthetic complications: no  No notable events documented.  Last Vitals:  Vitals:   01/02/24 0743 01/02/24 1119  BP: 118/83 112/82  Pulse: 84 76  Resp: 18 18  Temp: 36.9 C 36.6 C  SpO2: 98%     Last Pain:  Vitals:   01/02/24 1120  TempSrc:   PainSc: 0-No pain   Pain Goal:                   [employer]

## 2024-01-02 NOTE — Discharge Summary (Signed)
 Postpartum Discharge Summary  Date of Service updated***     Patient Name: [name] DOB: June 15, 1991 MRN: [id number]  Date of admission: 01/01/2024 Delivery date:01/02/2024 Delivering provider: CHUBB, CASEY C Date of discharge: 01/02/2024  Admitting diagnosis: Pregnant [Z34.90] Intrauterine pregnancy: [redacted]w[redacted]d     Secondary diagnosis:  Principal Problem:   Pregnant  Additional problems: ***    Discharge diagnosis: Term Pregnancy Delivered and LGA and cholestasis                                              Post partum procedures:{Postpartum procedures:23558} Augmentation: AROM and IP Foley Complications: None  Hospital course: Induction of Labor With Vaginal Delivery   33 y.o. yo [id number] at [redacted]w[redacted]d was admitted to the hospital 01/01/2024 for induction of labor.  Indication for induction: Cholestasis of pregnancy.  Patient had an labor course was uncomplicated. Membrane Rupture Time/Date: 9:10 PM,01/01/2024  Delivery Method:Vaginal, Spontaneous Operative Delivery:N/A Episiotomy: None Lacerations:  Vaginal Details of delivery can be found in separate delivery note.  Patient had a postpartum course complicated by***. Patient is discharged home 01/02/24.  Newborn Data: Birth date:01/02/2024 Birth time:3:56 AM Gender:Female Living status:Living Apgars:8 ,9  Weight:   Magnesium  Sulfate received: {Mag received:30440022} BMZ received: No Rhophylac:N/A MMR:N/A T-DaP:declined Flu: given prenatally RSV Vaccine received: No Transfusion:{Transfusion received:30440034}  Immunizations received: Immunization History  Administered Date(s) Administered   Influenza,inj,Quad PF,6+ Mos 05/05/2021   Influenza-Unspecified 04/23/2020   Tdap 06/08/2012, 08/17/2021    Physical exam  Vitals:   01/01/24 2300 01/02/24 0000 01/02/24 0300 01/02/24 0415  BP: 116/73 109/63 119/75 124/83  Pulse: 74 75 84 92  Resp:      Temp:      TempSrc:      SpO2:      Weight:      Height:        General: {Exam; general:21111117} Lochia: {Desc; appropriate/inappropriate:30686::appropriate} Uterine Fundus: {Desc; firm/soft:30687} Incision: {Exam; incision:21111123} DVT Evaluation: {Exam; dvt:2111122} Labs: Lab Results  Component Value Date   WBC 7.8 01/01/2024   HGB 10.0 (L) 01/01/2024   HCT 31.1 (L) 01/01/2024   MCV 85.9 01/01/2024   PLT 218 01/01/2024      Latest Ref Rng & Units 01/01/2024   11:03 AM  CMP  Glucose 70 - 99 mg/dL 90   BUN 6 - 20 mg/dL <5   Creatinine 9.55 - 1.00 mg/dL 9.43   Sodium 864 - 854 mmol/L 138   Potassium 3.5 - 5.1 mmol/L 3.8   Chloride 98 - 111 mmol/L 108   CO2 22 - 32 mmol/L 21   Calcium  8.9 - 10.3 mg/dL 8.3   Total Protein 6.5 - 8.1 g/dL 5.5   Total Bilirubin 0.0 - 1.2 mg/dL 0.5   Alkaline Phos 38 - 126 U/L 113   AST 15 - 41 U/L 19   ALT 0 - 44 U/L 12    Edinburgh Score:    10/29/2021   11:17 AM  Edinburgh Postnatal Depression Scale Screening Tool  I have been able to laugh and see the funny side of things. 0  I have looked forward with enjoyment to things. 0  I have blamed myself unnecessarily when things went wrong. 0  I have been anxious or worried for no good reason. 0  I have felt scared or panicky for no good reason. 0  Things have been  getting on top of me. 0  I have been so unhappy that I have had difficulty sleeping. 0  I have felt sad or miserable. 0  I have been so unhappy that I have been crying. 0  The thought of harming myself has occurred to me. 0  Edinburgh Postnatal Depression Scale Total 0      Data saved with a previous flowsheet row definition   No data recorded  After visit meds:  Allergies as of 01/02/2024   No Known Allergies   Med Rec must be completed prior to using this [facility]***        Discharge home in stable condition Infant Feeding: {Baby feeding:23562} Infant Disposition:{CHL IP OB HOME WITH FNUYZM:76418} Discharge instruction: per After Visit Summary and Postpartum  booklet. Activity: Advance as tolerated. Pelvic rest for 6 weeks.  Diet: {OB ipzu:78888878} Future Appointments: Future Appointments  Date Time Provider Department Center  01/02/2024  8:50 AM Ozan, Tishina, DO [facility]  01/09/2024  9:10 AM Jayne, Vonn DEL, MD [facility]   Follow up Visit: Message to [facility] 6/23  Please schedule this patient for a In person postpartum visit in 4 weeks with the following provider: Any provider. Additional Postpartum F/U:n/a  High risk pregnancy complicated by: cholestasis, LGA Delivery mode:  Vaginal, Spontaneous Anticipated Birth Control:  POPs   01/02/2024 Augustin JAYSON Slade, MD

## 2024-01-03 LAB — BIRTH TISSUE RECOVERY COLLECTION (PLACENTA DONATION)

## 2024-01-03 MED ORDER — SLYND 4 MG PO TABS: 1.0000 | ORAL_TABLET | Freq: Every day | ORAL | 11 refills | Status: DC

## 2024-01-03 MED ORDER — IBUPROFEN 600 MG PO TABS: 600.0000 mg | ORAL_TABLET | Freq: Four times a day (QID) | ORAL | 0 refills | Status: DC

## 2024-01-03 NOTE — Patient Instructions (Addendum)
 Your appointment with Outpatient Lactation is: Date: 01/17/2024 Time: 2:00 pm  [facility] (First Floor) [address] [place]  Check in under baby's name.  Please bring your baby hungry along with your pump and a bottle of either formula or expressed breast milk. Please also bring your pump flanges and we welcome support people! If you need lactation assistance before your appointment, please call [phone] and press 4 for lactation.    [facility] [phone] Fax [phone] [name] [name]. [address] [place], [place] [address] [email or web address] Will allow parents in the room Accepts Medicaid   Dr. Rylan Hansen, DDS [phone] [address]  [address] [place], [place] [address] [email or web address] Will allow parents in the room  Dr. Redell Distance, DDS [phone]  [facility] [address] [place], [place] [address] [email or web address]  Dr. Clancy Hammersmith [phone] [facility]  [address] [place], [place] [address] [email or web address] Accepts Medicaid   Dr. Cosette Hai [phone] [address] [place],  [address] [email or web address]

## 2024-01-05 ENCOUNTER — Encounter: Payer: Self-pay | Admitting: Advanced Practice Midwife

## 2024-01-06 ENCOUNTER — Encounter: Payer: Self-pay | Admitting: Obstetrics & Gynecology

## 2024-01-06 ENCOUNTER — Telehealth: Payer: Self-pay | Admitting: Adult Health

## 2024-01-06 MED ORDER — NORETHINDRONE 0.35 MG PO TABS: 1.0000 | ORAL_TABLET | Freq: Every day | ORAL | 11 refills | Status: DC

## 2024-01-06 NOTE — Telephone Encounter (Signed)
 Slynd  not covered by insurance, she is breastfeeding, will rx Micronor , don't start for 3 weeks

## 2024-01-09 ENCOUNTER — Encounter: Admitting: Obstetrics & Gynecology

## 2024-01-11 ENCOUNTER — Telehealth (HOSPITAL_COMMUNITY): Payer: Self-pay | Admitting: *Deleted

## 2024-01-11 NOTE — Telephone Encounter (Signed)
 01/11/2024  Name: [name] MRN: [id number] DOB: 01-Sep-1990  Reason for Call:  Transition of Care Hospital Discharge Call  Contact Status: Patient Contact Status: Complete  Language assistant needed: Interpreter Mode: Interpreter Not Needed        Follow-Up Questions: Do You Have Any Concerns About Your Health As You Heal From Delivery?: No Do You Have Any Concerns About Your Infants Health?: No  Edinburgh Postnatal Depression Scale:  In the Past 7 Days: I have been able to laugh and see the funny side of things.: As much as I always could I have looked forward with enjoyment to things.: As much as I ever did I have blamed myself unnecessarily when things went wrong.: No, never I have been anxious or worried for no good reason.: No, not at all I have felt scared or panicky for no good reason.: No, not at all Things have been getting on top of me.: No, I have been coping as well as ever I have been so unhappy that I have had difficulty sleeping.: Not at all I have felt sad or miserable.: No, not at all I have been so unhappy that I have been crying.: No, never The thought of harming myself has occurred to me.: Never Van Postnatal Depression Scale Total: 0  PHQ2-9 Depression Scale:     Discharge Follow-up: Edinburgh score requires follow up?: No Patient was advised of the following resources:: Support Group, Breastfeeding Support Group  Post-discharge interventions: Reviewed Newborn Safe Sleep Practices  Steva Tammy PEAK  01/11/2024 1537

## 2024-02-15 ENCOUNTER — Ambulatory Visit: Admitting: Advanced Practice Midwife

## 2024-02-15 ENCOUNTER — Encounter: Payer: Self-pay | Admitting: Advanced Practice Midwife

## 2024-02-15 DIAGNOSIS — R300 Dysuria: Secondary | ICD-10-CM

## 2024-02-15 DIAGNOSIS — R8290 Unspecified abnormal findings in urine: Secondary | ICD-10-CM

## 2024-02-15 DIAGNOSIS — Z3A37 37 weeks gestation of pregnancy: Secondary | ICD-10-CM

## 2024-02-15 LAB — POCT URINALYSIS DIPSTICK OB
Blood, UA: NEGATIVE
Glucose, UA: NEGATIVE
Ketones, UA: NEGATIVE
Nitrite, UA: NEGATIVE
POC,PROTEIN,UA: NEGATIVE

## 2024-02-15 MED ORDER — NORETHINDRONE 0.35 MG PO TABS: 1.0000 | ORAL_TABLET | Freq: Every day | ORAL | 11 refills | Status: AC

## 2024-02-15 NOTE — Patient Instructions (Signed)
Use the website [email or web address].net for helpful postpartum tips.

## 2024-02-15 NOTE — Progress Notes (Signed)
 POSTPARTUM VISIT Patient name: [name] MRN [id number]  Date of birth: 1990/07/21 Chief Complaint:   Postpartum Care  History of Present Illness:   [name] is a 33 y.o. [phone] Caucasian female being seen today for a postpartum visit. She is 6 weeks postpartum following a spontaneous vaginal delivery at 37.3 gestational weeks. IOL: yes, for cholestasis. Anesthesia: epidural.  Laceration: none.  Complications: none. Inpatient contraception: no.   Pregnancy complicated by cholestasis. Tobacco use: no. Substance use disorder: no. Last pap smear: Sept 2022 and results were NILM w/ HRHPV negative. Next pap smear due: Sept 2025 Patient's last menstrual period was 04/15/2023 (exact date).  Postpartum course has been uncomplicated. Bleeding none. Bowel function is normal. Bladder function is normal; had some discomfort yesterday w urination but better today. Urinary incontinence? no, fecal incontinence? no Patient is sexually active. Last sexual activity: last week. Desired contraception: POPs. Patient does not know about a pregnancy in the future.  Desired family size is unsure number of children.   Upstream - 02/15/24 1031       Pregnancy Intention Screening   Does the patient want to become pregnant in the next year? No    Does the patient's partner want to become pregnant in the next year? No    Would the patient like to discuss contraceptive options today? No      Contraception Wrap Up   Current Method Oral Contraceptive    End Method Oral Contraceptive    Contraception Counseling Provided No         The pregnancy intention screening data noted above was reviewed. Potential methods of contraception were discussed. The patient elected to proceed with Oral Contraceptive.  Edinburgh Postpartum Depression Screening: negative  Edinburgh Postnatal Depression Scale - 02/15/24 1030       Edinburgh Postnatal Depression Scale:  In the Past 7 Days   I have been able to laugh  and see the funny side of things. 0    I have looked forward with enjoyment to things. 0    I have blamed myself unnecessarily when things went wrong. 0    I have been anxious or worried for no good reason. 0    I have felt scared or panicky for no good reason. 0    Things have been getting on top of me. 0    I have been so unhappy that I have had difficulty sleeping. 0    I have felt sad or miserable. 0    I have been so unhappy that I have been crying. 0    The thought of harming myself has occurred to me. 0    Edinburgh Postnatal Depression Scale Total 0             10/26/2023    9:04 AM 07/14/2021    8:36 AM 04/06/2021    2:13 PM  GAD 7 : Generalized Anxiety Score  Nervous, Anxious, on Edge 0 0 0  Control/stop worrying 0 0 0  Worry too much - different things 0 0 0  Trouble relaxing 0 0 0  Restless 0 0 0  Easily annoyed or irritable 0 0 0  Afraid - awful might happen 0 0 0  Total GAD 7 Score 0 0 0     Baby's course has been uncomplicated. Baby is feeding by breast: milk supply adequate. Infant has a pediatrician/family doctor? Yes.  Childcare strategy if returning to work/school: family.  Pt has material needs met for  her and baby: Yes.   Review of Systems:   Pertinent items are noted in HPI Denies Abnormal vaginal discharge w/ itching/odor/irritation, headaches, visual changes, shortness of breath, chest pain, abdominal pain, severe nausea/vomiting, or problems with urination or bowel movements. Pertinent History Reviewed:  Reviewed past medical,surgical, obstetrical and family history.  Reviewed problem list, medications and allergies. OB History  Gravida Para Term Preterm AB Living  4 3 3  0 1 3  SAB IAB Ectopic Multiple Live Births  1 0 0 0 3    # Outcome Date GA Lbr Len/2nd Weight Sex Type Anes PTL Lv  4 Term 01/02/24 [redacted]w[redacted]d 03:20 / 00:17 8 lb 4 oz (3.742 kg) F Vag-Spont EPI  LIV  3 Term 09/24/21 [redacted]w[redacted]d 20:49 7 lb 5.8 oz (3.34 kg) F Vag-Spont EPI N LIV      Complications: Gestational hypertension  2 SAB 2018          1 Term 06/06/12 [redacted]w[redacted]d 53:01 / 03:15 7 lb 12.7 oz (3.535 kg) M Vag-Spont EPI  LIV     Birth Comments: WNL     Complications: Gestational hypertension   Physical Assessment:   Vitals:   02/15/24 1031  BP: 137/82  Pulse: 99  Weight: 168 lb (76.2 kg)  Height: 5' 6 (1.676 m)  Body mass index is 27.12 kg/m.       Physical Examination:   General appearance: alert, well appearing, and in no distress  Mental status: alert, oriented to person, place, and time  Skin: warm & dry   Cardiovascular: normal heart rate noted   Respiratory: normal respiratory effort, no distress   Breasts: deferred, no complaints   Abdomen: soft, non-tender   Pelvic: examination not indicated. Thin prep pap obtained: No  Rectal: not examined  Extremities: Edema: none        Results for orders placed or performed in visit on 02/15/24 (from the past 24 hours)  POC Urinalysis Dipstick OB   Collection Time: 02/15/24 10:38 AM  Result Value Ref Range   Color, UA     Clarity, UA     Glucose, UA Negative Negative   Bilirubin, UA     Ketones, UA neg    Spec Grav, UA     Blood, UA neg    pH, UA     POC,PROTEIN,UA Negative Negative, Trace, Small (1+), Moderate (2+), Large (3+), 4+   Urobilinogen, UA     Nitrite, UA neg    Leukocytes, UA Small (1+) (A) Negative   Appearance     Odor      Assessment & Plan:  1) Postpartum exam 2) Six wks s/p spontaneous vaginal delivery 3) breast feeding 4) Depression screening: negative 5) Contraception management: resent Micronor ; instructed to take same time of day and use backup x first month (Slynd  not covered) 6) s/p cholestasis  Essential components of care per ACOG recommendations:  1.  Mood and well being:  If positive depression screen, discussed and plan developed.  If using tobacco we discussed reduction/cessation and risk of relapse If current substance abuse, we discussed and referral to local  resources was offered.   2. Infant care and feeding:  If breastfeeding, discussed returning to work, pumping, breastfeeding-associated pain, guidance regarding return to fertility while lactating if not using another method. If needed, patient was provided with a letter to be allowed to pump q 2-3hrs to support lactation in a private location with access to a refrigerator to store breastmilk.   Recommended that all  caregivers be immunized for flu, pertussis and other preventable communicable diseases If pt does not have material needs met for her/baby, referred to local resources for help obtaining these.  3. Sexuality, contraception and birth spacing Provided guidance regarding sexuality, management of dyspareunia, and resumption of intercourse Discussed avoiding interpregnancy interval <25mths and recommended birth spacing of 18 months  4. Sleep and fatigue Discussed coping options for fatigue and sleep disruption Encouraged family/partner/community support of 4 hrs of uninterrupted sleep to help with mood and fatigue  5. Physical recovery  If pt had a C/S, assessed incisional pain and providing guidance on normal vs prolonged recovery If pt had a laceration, perineal healing and pain reviewed.  If urinary or fecal incontinence, discussed management and referred to PT or uro/gyn if indicated  Patient is safe to resume physical activity. Discussed attainment of healthy weight.  6.  Chronic disease management Discussed pregnancy complications if any, and their implications for future childbearing and long-term maternal health. Review recommendations for prevention of recurrent pregnancy complications, such as 17 hydroxyprogesterone caproate to reduce risk for recurrent PTB not applicable, or aspirin  to reduce risk of preeclampsia yes. Pt had GDM: no. If yes, 2hr GTT scheduled: not applicable. Reviewed medications and non-pregnant dosing including consideration of whether pt is breastfeeding  using a reliable resource such as LactMed: yes Referred for f/u w/ PCP or subspecialist providers as indicated: not applicable  7. Health maintenance Mammogram at 32yo or earlier if indicated Pap smears as indicated  Meds:  Meds ordered this encounter  Medications   norethindrone  (MICRONOR ) 0.35 MG tablet    Sig: Take 1 tablet (0.35 mg total) by mouth daily.    Dispense:  28 tablet    Refill:  11    Supervising Provider:   MARILYNN NEST [[id number]]    Follow-up: Return for annual w Pap in Oct.   Orders Placed This Encounter  Procedures   POC Urinalysis Dipstick OB    Suzen JONETTA Gentry CNM 02/15/2024 11:08 AM

## 2024-02-29 ENCOUNTER — Encounter: Payer: Self-pay | Admitting: Women's Health

## 2024-03-20 ENCOUNTER — Telehealth: Admitting: Physician Assistant

## 2024-03-20 DIAGNOSIS — H00011 Hordeolum externum right upper eyelid: Secondary | ICD-10-CM | POA: Diagnosis not present

## 2024-03-20 MED ORDER — ERYTHROMYCIN 5 MG/GM OP OINT: 1.0000 | TOPICAL_OINTMENT | Freq: Four times a day (QID) | OPHTHALMIC | 0 refills | Status: AC

## 2024-03-20 NOTE — Progress Notes (Signed)
 Virtual Visit Consent   [name], you are scheduled for a virtual visit with a [facility] provider today. Just as with appointments in the office, your consent must be obtained to participate. Your consent will be active for this visit and any virtual visit you may have with one of our providers in the next 365 days. If you have a MyChart account, a copy of this consent can be sent to you electronically.  As this is a virtual visit, video technology does not allow for your provider to perform a traditional examination. This may limit your provider's ability to fully assess your condition. If your provider identifies any concerns that need to be evaluated in person or the need to arrange testing (such as labs, EKG, etc.), we will make arrangements to do so. Although advances in technology are sophisticated, we cannot ensure that it will always work on either your end or our end. If the connection with a video visit is poor, the visit may have to be switched to a telephone visit. With either a video or telephone visit, we are not always able to ensure that we have a secure connection.  By engaging in this virtual visit, you consent to the provision of healthcare and authorize for your insurance to be billed (if applicable) for the services provided during this visit. Depending on your insurance coverage, you may receive a charge related to this service.  I need to obtain your verbal consent now. Are you willing to proceed with your visit today? MIIA BLANKS has provided verbal consent on 03/20/2024 for a virtual visit (video or telephone). Glorimar Stroope, PA-C  Date: 03/20/2024 1:12 PM   Virtual Visit via Video Note   I, [name], connected with  [name]  ([id number], 05-Sep-1990) on 03/20/24 at 12:45 PM EDT by a video-enabled telemedicine application and verified that I am speaking with the correct person using two identifiers.  Location: Patient: Virtual Visit Location Patient:  Other: CAR Provider: Virtual Visit Location Provider: Home Office   I discussed the limitations of evaluation and management by telemedicine and the availability of in person appointments. The patient expressed understanding and agreed to proceed.    History of Present Illness: [name] is a 33 y.o. who identifies as a female who was assigned female at birth, and is being seen today for right eyelid swelling for the last 2 days. Reports itching and some discomfort with palpation. No sick contacts. She noticed a small white bump and might have scratch it but the redness mostly int he corner of the eye is present. Reports waking up with the whole lid swollen and red, somewhat better later. Has tried warm compress. Denies f/c/n/v/ear pain. Does not wear glasses, contacts.      HPI: Problems:  Patient Active Problem List   Diagnosis Date Noted   History of gestational hypertension 04/06/2021   Family history of breast cancer in mother 04/06/2021    Allergies: No Known Allergies Medications:  Current Outpatient Medications:    erythromycin  ophthalmic ointment, Place 1 Application into the right eye 4 (four) times daily for 7 days., Disp: 3.5 g, Rfl: 0   norethindrone  (MICRONOR ) 0.35 MG tablet, Take 1 tablet (0.35 mg total) by mouth daily., Disp: 28 tablet, Rfl: 11   OVER THE COUNTER MEDICATION, Prenatal gummies (Patient not taking: Reported on 02/15/2024), Disp: , Rfl:   Observations/Objective: Patient is well-developed, well-nourished in no acute distress.  sitting comfortably  Head is normocephalic,  atraumatic.  No labored breathing.  Speech is clear and coherent with logical content.  Patient is alert and oriented at baseline.  Right eye: upper eyelid with edema, predominately in the inner corner, no active drainage, no crusting noted EOM intact B/L, normal conjunctiva   Assessment and Plan: 1. Hordeolum externum of right upper eyelid (Primary) - erythromycin  ophthalmic  ointment; Place 1 Application into the right eye 4 (four) times daily for 7 days.  Dispense: 3.5 g; Refill: 0  Frequent hand washing Warm compress Medication as directed  PCP follow-up in 3-5 days, sooner if new or worsening symptoms    Follow Up Instructions: I discussed the assessment and treatment plan with the patient. The patient was provided an opportunity to ask questions and all were answered. The patient agreed with the plan and demonstrated an understanding of the instructions.  A copy of instructions were sent to the patient via MyChart unless otherwise noted below.    The patient was advised to call back or seek an in-person evaluation if the symptoms worsen or if the condition fails to improve as anticipated.    Meya Clutter, PA-C

## 2024-03-20 NOTE — Patient Instructions (Signed)
 [name], thank you for joining Judith Rosano, PA-C for today's virtual visit.  While this provider is not your primary care provider (PCP), if your PCP is located in our provider database this encounter information will be shared with them immediately following your visit.   A [facility] MyChart account gives you access to today's visit and all your visits, tests, and labs performed at [facility]  click here if you don't have a [facility] MyChart account or go to [email or web address]  Consent: (Patient) [name] provided verbal consent for this virtual visit at the beginning of the encounter.  Current Medications:  Current Outpatient Medications:    erythromycin  ophthalmic ointment, Place 1 Application into the right eye 4 (four) times daily for 7 days., Disp: 3.5 g, Rfl: 0   norethindrone  (MICRONOR ) 0.35 MG tablet, Take 1 tablet (0.35 mg total) by mouth daily., Disp: 28 tablet, Rfl: 11   OVER THE COUNTER MEDICATION, Prenatal gummies (Patient not taking: Reported on 02/15/2024), Disp: , Rfl:    Medications ordered in this encounter:  Meds ordered this encounter  Medications   erythromycin  ophthalmic ointment    Sig: Place 1 Application into the right eye 4 (four) times daily for 7 days.    Dispense:  3.5 g    Refill:  0    Supervising Provider:   BLAISE ALEENE KIDD [[id number]]     *If you need refills on other medications prior to your next appointment, please contact your pharmacy*  Follow-Up: Call back or seek an in-person evaluation if the symptoms worsen or if the condition fails to improve as anticipated.  [facility] [phone]  Other Instructions Stye A stye, also known as a hordeolum, is a bump that forms on an eyelid. It may look like a pimple next to the eyelash. A stye can form inside the eyelid (internal stye) or outside the eyelid (external stye). A stye can cause redness, swelling, and pain on the eyelid. Styes are  very common. Anyone can get them at any age. They usually occur in just one eye at a time, but you may have more than one in either eye. What are the causes? A stye is caused by an infection. The infection is almost always caused by bacteria called Staphylococcus aureus. This is a common type of bacteria that lives on the skin. An internal stye may result from an infected oil-producing gland inside the eyelid. An external stye may be caused by an infection at the base of the eyelash (hair follicle). What increases the risk? You are more likely to develop a stye if: You have had a stye before. You have any of these conditions: Red, itchy, inflamed eyelids (blepharitis). A skin condition such as seborrheic dermatitis or rosacea. High fat levels in your blood (lipids). Dry eyes. What are the signs or symptoms? The most common symptom of a stye is eyelid pain. Internal styes are more painful than external styes. Other symptoms may include: Painful swelling of your eyelid. A scratchy feeling in your eye. Tearing and redness of your eye. A pimple-like bump on the edge of the eyelid. Pus draining from the stye. How is this diagnosed? Your health care provider may be able to diagnose a stye just by examining your eye. The health care provider may also check to make sure: You do not have a fever or other signs of a more serious infection. The infection has not spread to other parts of your eye or  areas around your eye. How is this treated? Most styes will clear up in a few days without treatment or with warm compresses applied to the area. You may need to use antibiotic drops or ointment to treat an infection. Sometimes, steroid drops or ointment are used in addition to antibiotics. In some cases, your health care provider may give you a small steroid injection in the eyelid. If your stye does not heal with routine treatment, your health care provider may drain pus from the stye using a thin blade  or needle. This may be done if the stye is large, causing a lot of pain, or affecting your vision. Follow these instructions at home: Take over-the-counter and prescription medicines only as told by your health care provider. This includes eye drops or ointments. If you were prescribed an antibiotic medicine, steroid medicine, or both, apply or use them as told by your health care provider. Do not stop using the medicine even if your condition improves. Apply a warm, wet cloth (warm compress) to your eye for 5-10 minutes, 4 to 6 times a day. Clean the affected eyelid as directed by your health care provider. Do not wear contact lenses or eye makeup until your stye has healed and your health care provider says that it is safe. Do not try to pop or drain the stye. Do not rub your eye. Contact a health care provider if: You have chills or a fever. Your stye does not go away after several days. Your stye affects your vision. Your eyeball becomes swollen, red, or painful. Get help right away if: You have pain when moving your eye around. Summary A stye is a bump that forms on an eyelid. It may look like a pimple next to the eyelash. A stye can form inside the eyelid (internal stye) or outside the eyelid (external stye). A stye can cause redness, swelling, and pain on the eyelid. Your health care provider may be able to diagnose a stye just by examining your eye. Apply a warm, wet cloth (warm compress) to your eye for 5-10 minutes, 4 to 6 times a day. This information is not intended to replace advice given to you by your health care provider. Make sure you discuss any questions you have with your health care provider. Document Revised: 09/03/2020 Document Reviewed: 09/03/2020 Elsevier Patient Education  2024 Elsevier Inc.  If you have been instructed to have an in-person evaluation today at a local Urgent Care facility, please use the link below. It will take you to a list of all of our  available [facility], including address, phone number and hours of operation. Please do not delay care.  [facility]  If you or a family member do not have a primary care provider, use the link below to schedule a visit and establish care. When you choose a [facility] primary care physician or advanced practice provider, you gain a long-term partner in health. Find a Primary Care Provider  Learn more about [facility]'s in-office and virtual care options: [facility] - Get Care Now

## 2024-04-12 ENCOUNTER — Encounter: Payer: Self-pay | Admitting: Obstetrics & Gynecology

## 2024-04-12 ENCOUNTER — Ambulatory Visit (INDEPENDENT_AMBULATORY_CARE_PROVIDER_SITE_OTHER): Admitting: Obstetrics & Gynecology

## 2024-04-12 ENCOUNTER — Other Ambulatory Visit (HOSPITAL_COMMUNITY)
Admission: RE | Admit: 2024-04-12 | Discharge: 2024-04-12 | Disposition: A | Source: Ambulatory Visit | Attending: Obstetrics & Gynecology | Admitting: Obstetrics & Gynecology

## 2024-04-12 VITALS — BP 127/82 | HR 74 | Ht 67.0 in | Wt 164.8 lb

## 2024-04-12 DIAGNOSIS — Z01419 Encounter for gynecological examination (general) (routine) without abnormal findings: Secondary | ICD-10-CM | POA: Insufficient documentation

## 2024-04-12 DIAGNOSIS — Z1151 Encounter for screening for human papillomavirus (HPV): Secondary | ICD-10-CM

## 2024-04-12 DIAGNOSIS — Z1331 Encounter for screening for depression: Secondary | ICD-10-CM

## 2024-04-12 NOTE — Progress Notes (Signed)
 WELL-WOMAN EXAMINATION Patient name: [name] MRN [id number]  Date of birth: July 25, 1990 Chief Complaint:   Gynecologic Exam  History of Present Illness:   TRINTY Potter is a 33 y.o. [phone]  female being seen today for a routine well-woman exam.   Patient is still breast-feeding and not having periods.  She denies vaginal discharge, itching, or irritation.  She denies pelvic or abdominal pain.  She reports no acute GYN concerns   No LMP recorded. (Menstrual status: Lactating).  The current method of family planning is coitus interruptus.    Last pap 2022.  Last mammogram: NA. Mother breast @ 41yo Last colonoscopy: NA     04/12/2024    8:36 AM 10/26/2023    9:03 AM 07/14/2021    8:36 AM 04/06/2021    2:13 PM  Depression screen PHQ 2/9  Decreased Interest 0 0 0 0  Down, Depressed, Hopeless 0 0 0 0  PHQ - 2 Score 0 0 0 0  Altered sleeping 0 0 0 0  Tired, decreased energy 0 0 0 0  Change in appetite 0 0 0 0  Feeling bad or failure about yourself  0 0 0 0  Trouble concentrating 0 0 0 0  Moving slowly or fidgety/restless 0 0 0 0  Suicidal thoughts 0 0 0 0  PHQ-9 Score 0 0 0 0      Review of Systems:   Pertinent items are noted in HPI Denies any headaches, blurred vision, fatigue, shortness of breath, chest pain, abdominal pain, bowel movements, urination, or intercourse unless otherwise stated above.  Pertinent History Reviewed:  Reviewed past medical,surgical, social and family history.  Reviewed problem list, medications and allergies. Physical Assessment:   Vitals:   04/12/24 0831  BP: 127/82  Pulse: 74  Weight: 164 lb 12.8 oz (74.8 kg)  Height: 5' 7 (1.702 m)  Body mass index is 25.81 kg/m.        Physical Examination:   General appearance - well appearing, and in no distress  Mental status - alert, oriented to person, place, and time  Psych:  She has a normal mood and affect  Skin - warm and dry, normal color, no suspicious lesions  noted  Chest - effort normal, all lung fields clear to auscultation bilaterally  Heart - normal rate and regular rhythm  Neck:  midline trachea, no thyromegaly or nodules  Breasts - breasts appear normal, no suspicious masses, no skin or nipple changes or  axillary nodes  Abdomen - soft, nontender, nondistended, no masses or organomegaly  Pelvic - VULVA: normal appearing vulva with no masses, tenderness or lesions  VAGINA: normal appearing vagina with normal color and discharge, no lesions  CERVIX: normal appearing cervix without discharge or lesions, no CMT  Thin prep pap is done with HR HPV cotesting  UTERUS: uterus is felt to be normal size, shape, consistency and nontender   ADNEXA: No adnexal masses or tenderness noted.   Extremities:  No swelling or varicosities noted  Chaperone: Alan Fischer     Assessment & Plan:  1) Well-Woman Exam -pap collected, reviewed ASCCP guidelines  2) Contraceptive management - Reviewed management options ranging from pills to LARCs to permanent surgical intervention such as bilateral salpingectomy or consideration for vasectomy.  Questions and concerns were addressed and for now plans to consider her options  No orders of the defined types were placed in this encounter.   Meds: No orders of the defined types were placed in this  encounter.   Follow-up: Return in about 1 year (around 04/12/2025) for Annual.   Josedaniel Haye, DO Attending Obstetrician & Gynecologist, [facility], [facility]

## 2024-04-16 LAB — CYTOLOGY - PAP
Adequacy: ABSENT
Comment: NEGATIVE
Diagnosis: NEGATIVE
Diagnosis: REACTIVE
High risk HPV: NEGATIVE

## 2024-04-17 ENCOUNTER — Ambulatory Visit: Payer: Self-pay | Admitting: Obstetrics & Gynecology

## 2024-05-28 ENCOUNTER — Telehealth: Admitting: Emergency Medicine

## 2024-05-28 DIAGNOSIS — R3989 Other symptoms and signs involving the genitourinary system: Secondary | ICD-10-CM

## 2024-05-28 MED ORDER — CEPHALEXIN 500 MG PO CAPS: 500.0000 mg | ORAL_CAPSULE | Freq: Two times a day (BID) | ORAL | 0 refills | Status: AC

## 2024-05-28 NOTE — Progress Notes (Signed)

## 2024-06-17 ENCOUNTER — Encounter: Payer: Self-pay | Admitting: Physician Assistant

## 2024-06-17 ENCOUNTER — Telehealth: Admitting: Physician Assistant

## 2024-06-17 DIAGNOSIS — B9689 Other specified bacterial agents as the cause of diseases classified elsewhere: Secondary | ICD-10-CM | POA: Diagnosis not present

## 2024-06-17 DIAGNOSIS — J208 Acute bronchitis due to other specified organisms: Secondary | ICD-10-CM | POA: Diagnosis not present

## 2024-06-17 MED ORDER — AMOXICILLIN 500 MG PO CAPS: 500.0000 mg | ORAL_CAPSULE | Freq: Three times a day (TID) | ORAL | 0 refills | Status: AC

## 2024-06-17 NOTE — Progress Notes (Signed)
  We are sorry that you are not feeling well.  Here is how we plan to help!  Based on your presentation I believe you most likely have A cough due to bacteria.  When patients have a fever and a productive cough with a change in color or increased sputum production, we are concerned about bacterial bronchitis.  If left untreated it can progress to pneumonia.  If your symptoms do not improve with your treatment plan it is important that you contact your provider.   I have prescribed Azithromyin 250 mg: two tablets now and then one tablet daily for 4 additonal days.  I have prescribed augmentin     In addition you may use OTC cough remedies that are safe to use while breastfeeding, such as mucinex.     From your responses in the eVisit questionnaire you describe inflammation in the upper respiratory tract which is causing a significant cough.  This is commonly called Bronchitis and has four common causes:   Allergies Viral Infections Acid Reflux Bacterial Infection Allergies, viruses and acid reflux are treated by controlling symptoms or eliminating the cause. An example might be a cough caused by taking certain blood pressure medications. You stop the cough by changing the medication. Another example might be a cough caused by acid reflux. Controlling the reflux helps control the cough.     HOME CARE Only take medications as instructed by your medical team. Complete the entire course of an antibiotic. Drink plenty of fluids and get plenty of rest. Avoid close contacts especially the very young and the elderly Cover your mouth if you cough or cough into your sleeve. Always remember to wash your hands A steam or ultrasonic humidifier can help congestion.   GET HELP RIGHT AWAY IF: You develop worsening fever. You become short of breath You cough up blood. Your symptoms persist after you have completed your treatment plan MAKE SURE YOU  Understand these instructions. Will watch your  condition. Will get help right away if you are not doing well or get worse.  Your e-visit answers were reviewed by a board certified advanced clinical practitioner to complete your personal care plan.  Depending on the condition, your plan could have included both over the counter or prescription medications. If there is a problem please reply  once you have received a response from your provider. Your safety is important to us .  If you have drug allergies check your prescription carefully.    You can use MyChart to ask questions about today's visit, request a non-urgent call back, or ask for a work or school excuse for 24 hours related to this e-Visit. If it has been greater than 24 hours you will need to follow up with your provider, or enter a new e-Visit to address those concerns. You will get an e-mail in the next two days asking about your experience.  I hope that your e-visit has been valuable and will speed your recovery. Thank you for using e-visits.   I have spent 10 minutes in review of e-visit questionnaire, review and updating patient chart, medical decision making and response to patient.   Kirk RAMAN Mayers, PA-C

## 2024-07-27 ENCOUNTER — Ambulatory Visit: Payer: Self-pay | Admitting: Family Medicine

## 2024-11-09 ENCOUNTER — Ambulatory Visit: Payer: Self-pay
